# Patient Record
Sex: Male | Born: 1950 | Race: Black or African American | Hispanic: No | Smoking: Never smoker
Health system: Southern US, Community
[De-identification: ages and names within clinical notes are randomized; demographics above are authoritative.]

## PROBLEM LIST (undated history)

## (undated) DIAGNOSIS — F329 Major depressive disorder, single episode, unspecified: Secondary | ICD-10-CM

## (undated) DIAGNOSIS — I255 Ischemic cardiomyopathy: Secondary | ICD-10-CM

## (undated) DIAGNOSIS — I10 Essential (primary) hypertension: Secondary | ICD-10-CM

## (undated) DIAGNOSIS — E559 Vitamin D deficiency, unspecified: Secondary | ICD-10-CM

## (undated) DIAGNOSIS — R9431 Abnormal electrocardiogram [ECG] [EKG]: Secondary | ICD-10-CM

## (undated) DIAGNOSIS — J90 Pleural effusion, not elsewhere classified: Secondary | ICD-10-CM

## (undated) DIAGNOSIS — M109 Gout, unspecified: Secondary | ICD-10-CM

## (undated) DIAGNOSIS — E119 Type 2 diabetes mellitus without complications: Secondary | ICD-10-CM

## (undated) DIAGNOSIS — I5042 Chronic combined systolic (congestive) and diastolic (congestive) heart failure: Secondary | ICD-10-CM

## (undated) DIAGNOSIS — I2510 Atherosclerotic heart disease of native coronary artery without angina pectoris: Secondary | ICD-10-CM

## (undated) DIAGNOSIS — K219 Gastro-esophageal reflux disease without esophagitis: Secondary | ICD-10-CM

## (undated) DIAGNOSIS — N183 Chronic kidney disease, stage 3 unspecified: Secondary | ICD-10-CM

## (undated) DIAGNOSIS — I6529 Occlusion and stenosis of unspecified carotid artery: Secondary | ICD-10-CM

## (undated) DIAGNOSIS — F32A Depression, unspecified: Secondary | ICD-10-CM

## (undated) DIAGNOSIS — E785 Hyperlipidemia, unspecified: Secondary | ICD-10-CM

## (undated) DIAGNOSIS — I493 Ventricular premature depolarization: Secondary | ICD-10-CM

## (undated) HISTORY — DX: Atherosclerotic heart disease of native coronary artery without angina pectoris: I25.10

## (undated) HISTORY — DX: Ventricular premature depolarization: I49.3

## (undated) HISTORY — DX: Gastro-esophageal reflux disease without esophagitis: K21.9

## (undated) HISTORY — DX: Type 2 diabetes mellitus without complications: E11.9

## (undated) HISTORY — DX: Gout, unspecified: M10.9

## (undated) HISTORY — DX: Vitamin D deficiency, unspecified: E55.9

## (undated) HISTORY — DX: Chronic combined systolic (congestive) and diastolic (congestive) heart failure: I50.42

## (undated) HISTORY — PX: CARDIAC CATHETERIZATION: SHX172

## (undated) HISTORY — DX: Occlusion and stenosis of unspecified carotid artery: I65.29

## (undated) HISTORY — DX: Hyperlipidemia, unspecified: E78.5

## (undated) HISTORY — DX: Essential (primary) hypertension: I10

## (undated) HISTORY — DX: Pleural effusion, not elsewhere classified: J90

---

## 2005-09-03 ENCOUNTER — Other Ambulatory Visit: Payer: Self-pay

## 2005-09-03 ENCOUNTER — Emergency Department: Payer: Self-pay | Admitting: Emergency Medicine

## 2006-04-25 HISTORY — PX: CORONARY ANGIOPLASTY WITH STENT PLACEMENT: SHX49

## 2012-06-05 HISTORY — PX: CORONARY ANGIOPLASTY WITH STENT PLACEMENT: SHX49

## 2014-09-04 ENCOUNTER — Encounter (INDEPENDENT_AMBULATORY_CARE_PROVIDER_SITE_OTHER): Payer: Self-pay

## 2014-09-04 ENCOUNTER — Ambulatory Visit (INDEPENDENT_AMBULATORY_CARE_PROVIDER_SITE_OTHER): Payer: Non-veteran care | Admitting: Cardiovascular Disease

## 2014-09-04 ENCOUNTER — Encounter: Payer: Self-pay | Admitting: Cardiovascular Disease

## 2014-09-04 VITALS — BP 100/70 | HR 63 | Ht 66.0 in | Wt 168.0 lb

## 2014-09-04 DIAGNOSIS — I255 Ischemic cardiomyopathy: Secondary | ICD-10-CM | POA: Insufficient documentation

## 2014-09-04 DIAGNOSIS — Z955 Presence of coronary angioplasty implant and graft: Secondary | ICD-10-CM | POA: Diagnosis not present

## 2014-09-04 DIAGNOSIS — R0602 Shortness of breath: Secondary | ICD-10-CM

## 2014-09-04 DIAGNOSIS — J40 Bronchitis, not specified as acute or chronic: Secondary | ICD-10-CM | POA: Insufficient documentation

## 2014-09-04 DIAGNOSIS — I2510 Atherosclerotic heart disease of native coronary artery without angina pectoris: Secondary | ICD-10-CM

## 2014-09-04 DIAGNOSIS — I5022 Chronic systolic (congestive) heart failure: Secondary | ICD-10-CM

## 2014-09-04 NOTE — Progress Notes (Signed)
Patient ID: Kyle MilroyWillie Pomerleau, male    DOB: 09/27/1950, 64 y.o.   MRN: [id number]  HPI Comments: Mr. Kyle Morton is a very pleasant 64 year old gentleman with a history of coronary artery disease, xience stent placed to his diagonal #1 in 2007, 2.5 x 15 mm stent placed to his OM 3 in 2013, with 3 stents placed in 2015 though details of his coronary anatomy are unavailable at this time, who presents for evaluation of shortness of breath. Baseline ejection fraction 30-35%.  He reports that he's had a difficult month or 2. He reports having fatigue, shortness of breath, cough with sputum. Symptoms got worse in December and January 2016. He was started on antibiotics with some improvement of his symptoms. Also given prednisone, albuterol inhaler, medications for his sinuses, Mucinex. He continues to have tightness in his chest, shortness of breath, occasional cough. Symptoms of shortness of breath are worse when supine, better when upright. [facility] and given a high dose Lasix here previously was taking Lasix 20 with a grams twice a day. He was told to take Lasix 40 mg in the morning, 20 mg in the p.m. for a short period of time. No significant improvement in his symptoms. Through this period, he was drinking more fluids. Weight has been stable in the low 160 range. He's also cautious about his renal function with overdiuresis. He was told on one of his emergency room visits that his labs were abnormal consistent with CHF. Possibly elevated BNP. This was in the setting of upper respiratory infection.  Records have been requested from the [facility] EKG today shows normal sinus rhythm with rate 63 bpm, poor R-wave progression to the anterior leads, unable to exclude anterior MI, T-wave abnormality anterolateral leads    No Known Allergies  Outpatient Encounter Prescriptions as of 09/04/2014  Medication Sig  . allopurinol (ZYLOPRIM) 100 MG tablet Take 100 mg by mouth daily.  Kyle Morton. aspirin 81 MG  tablet Take 81 mg by mouth daily.  Kyle Morton. atorvastatin (LIPITOR) 80 MG tablet Take 80 mg by mouth daily.  . benzonatate (TESSALON) 100 MG capsule Take by mouth 4 (four) times daily as needed for cough.  . carvedilol (COREG) 12.5 MG tablet Take 12.5 mg by mouth 2 (two) times daily with a meal.  . clopidogrel (PLAVIX) 75 MG tablet Take 75 mg by mouth daily.  . fluticasone (VERAMYST) 27.5 MCG/SPRAY nasal spray Place 2 sprays into the nose daily.  . furosemide (LASIX) 20 MG tablet Take 20 mg by mouth 2 (two) times daily.  Kyle Morton. guaifenesin (HUMIBID E) 400 MG TABS tablet Take 400 mg by mouth every 4 (four) hours.  . isosorbide mononitrate (IMDUR) 60 MG 24 hr tablet Take 60 mg by mouth daily.  Kyle Morton. lisinopril (PRINIVIL,ZESTRIL) 40 MG tablet Take 40 mg by mouth daily.  Kyle Morton. omeprazole (PRILOSEC) 20 MG capsule Take 40 mg by mouth daily.  Kyle Morton. spironolactone (ALDACTONE) 25 MG tablet Take 12.5 mg by mouth daily.  . traZODone (DESYREL) 100 MG tablet Take 100 mg by mouth at bedtime.  Kyle Morton. venlafaxine XR (EFFEXOR-XR) 150 MG 24 hr capsule Take 150 mg by mouth daily with breakfast.  . Vitamin D, Ergocalciferol, (DRISDOL) 50000 UNITS CAPS capsule Take 50,000 Units by mouth every 7 (seven) days.    Past Medical History  Diagnosis Date  . Pleural effusion   . CHF (congestive heart failure)   . Coronary artery disease   . Hypertension   . Hyperlipidemia   .  Diabetes mellitus without complication   . PVC's (premature ventricular contractions)   . Vitamin D deficiency   . Gout     feet   . GERD (gastroesophageal reflux disease)     Past Surgical History  Procedure Laterality Date  . Cardiac catheterization  2013,2007  . Coronary angioplasty with stent placement  06/05/2012    OM3  . Coronary angioplasty with stent placement  04/25/2006    RAD    Social History  reports that he has never smoked. He does not have any smokeless tobacco history on file. He reports that he does not drink alcohol or use illicit  drugs.  Family History family history includes Hypertension in his brother, father, and mother.      Review of Systems  Constitutional: Positive for fatigue.  Respiratory: Positive for cough and shortness of breath.   Cardiovascular: Negative.   Gastrointestinal: Negative.   Musculoskeletal: Negative.   Skin: Positive for rash.  Neurological: Negative.   Hematological: Negative.   Psychiatric/Behavioral: Negative.   All other systems reviewed and are negative.   BP 100/70 mmHg  Pulse 63  Ht  (1.676 m)  Wt 168 lb (76.204 kg)  BMI 27.13 kg/m2  Physical Exam  Constitutional: He is oriented to person, place, and time. He appears well-developed and well-nourished.  HENT:  Head: Normocephalic.  Nose: Nose normal.  Mouth/Throat: Oropharynx is clear and moist.  Eyes: Conjunctivae are normal. Pupils are equal, round, and reactive to light.  Neck: Normal range of motion. Neck supple. No JVD present.  Cardiovascular: Normal rate, regular rhythm, S1 normal, S2 normal, normal heart sounds and intact distal pulses.  Exam reveals no gallop and no friction rub.   No murmur heard. Trace edema  Pulmonary/Chest: Effort normal and breath sounds normal. No respiratory distress. He has no wheezes. He has no rales. He exhibits no tenderness.  Abdominal: Soft. Bowel sounds are normal. He exhibits no distension. There is no tenderness.  Musculoskeletal: Normal range of motion. He exhibits edema. He exhibits no tenderness.  Lymphadenopathy:    He has no cervical adenopathy.  Neurological: He is alert and oriented to person, place, and time. Coordination normal.  Skin: Skin is warm and dry. No rash noted. No erythema.  Psychiatric: He has a normal mood and affect. His behavior is normal. Judgment and thought content normal.      Assessment and Plan   Nursing note and vitals reviewed.

## 2014-09-04 NOTE — Assessment & Plan Note (Signed)
We will try to obtain most recent echocardiogram. He reports no echocardiogram for at least 6 or 8 months, ejection fraction 30%. He has declined ICD at the [facility]

## 2014-09-04 NOTE — Assessment & Plan Note (Signed)
Currently with no symptoms of angina. No further workup at this time. Continue current medication regimen. 

## 2014-09-04 NOTE — Assessment & Plan Note (Signed)
After long discussion with him of his recent illness, I suspect he has resolving bronchitis with continued residual inflammatory lung disease. Suggested he continue on his albuterol inhaler, Mucinex. If symptoms get worse with increasing sputum, suggested he call our office.

## 2014-09-04 NOTE — Assessment & Plan Note (Signed)
Ejection fraction 30-35%. Recommended he increase the Lasix up to 40 mg in the morning and 20 mg in the evening with goal of 3 pound weight loss. Also recommended he decrease his fluid intake. He has been drinking more. Suspect he has acute on chronic diastolic CHF making his breathing worse. We can check his Bmp at his convenience in the next several weeks or when 3 pound weight decrease has occurred

## 2014-09-04 NOTE — Assessment & Plan Note (Signed)
His shortness of breath is likely multifactorial including residual effects from his bronchitis and are slowly resolving, also acute on chronic  systolic CHF. Treatment plan as above

## 2014-09-04 NOTE — Patient Instructions (Signed)
Please take lasix 40 mg in the am and 20 mg in the PM Monitor your weight daily Try to get your weight down 3 pounds  Come in for blood work when your weight is down 3 pounds  Please call us if you have new issues that need to be addressed before your next appt.  Your physician wants you to follow-up in: 2 -3 weeks

## 2014-09-04 NOTE — Assessment & Plan Note (Signed)
Records have been requested from the [facility]. Recommended that he continue on his aspirin and Plavix

## 2014-09-13 ENCOUNTER — Telehealth: Payer: Self-pay

## 2014-09-13 ENCOUNTER — Other Ambulatory Visit: Payer: Self-pay | Admitting: Cardiovascular Disease

## 2014-09-13 ENCOUNTER — Other Ambulatory Visit: Payer: Non-veteran care | Admitting: *Deleted

## 2014-09-13 NOTE — Telephone Encounter (Signed)
Would add extra isosorbide 30 mg in the evening for blood pressure Would increase Lasix up to 40 mg twice a day for recent weight gain Referral to CHF clinic for assistance and close monitoring

## 2014-09-13 NOTE — Telephone Encounter (Signed)
Left detailed message w/ Dr. Windell HummingbirdGollan's recommendation.  Advised him that I will call him Monday w/ appt to HF clinic.

## 2014-09-13 NOTE — Telephone Encounter (Signed)
Pt came to office for lab draw and gave the following BP & wt readings:   BP  Wt 1/16 148/117 1/28 140/90  159 1/30 145/105  1/31 151/110 163 2/1 150/114 164 2/2 153/95  164 2/3 156/112 162 2/4 131/106 161 2/5 147/101  2/6 136/114 159 2/7 152/112 159 2/8 148/114 160 2/9 144/111 156 2/10 146/114 159 2/11 132/98  164 2/12 156/125 163

## 2014-09-13 NOTE — Telephone Encounter (Signed)
Spoke w/ pt.  He reports he has been adhering to a low sodium diet and restricting his fluids to the point that his throat is dry.  Reports he has developed difficulty swallowing and is coughing up mucus.  Discussed w/ pt his diet and fluid intake. He reports that he is getting over bronchitis and is concerned about the secretions he is coughing up. He states that his lasix was increased to 40 mg qam and 20 mg in the evening, but he has not noticed a significant increase in his urination.  He states that he will call his PCP to discuss f/u for his bronchitis, but would like to know if he should increase his lasix over the weekend.

## 2014-09-13 NOTE — Telephone Encounter (Signed)
Pt has question regarding Lasix, states his weight oin 2/9 156 and next day 2/10 was 160. States when he swallows, it "hurts up in that area", and his stomach hurts with his SOB. Please call.

## 2014-09-14 LAB — BASIC METABOLIC PANEL
BUN/Creatinine Ratio: 17 (ref 10–22)
BUN: 21 mg/dL (ref 8–27)
CALCIUM: 8.6 mg/dL (ref 8.6–10.2)
CO2: 22 mmol/L (ref 18–29)
CREATININE: 1.25 mg/dL (ref 0.76–1.27)
Chloride: 101 mmol/L (ref 97–108)
GFR calc Af Amer: 69 mL/min/{1.73_m2} (ref >59)
GFR, EST NON AFRICAN AMERICAN: 60 mL/min/{1.73_m2} (ref >59)
Glucose: 255 mg/dL — ABNORMAL HIGH (ref 65–99)
Potassium: 4.4 mmol/L (ref 3.5–5.2)
Sodium: 140 mmol/L (ref 134–144)

## 2014-09-17 NOTE — Telephone Encounter (Signed)
Spoke w/ pt.  Advised him that we have not yet received his records from the [facility].  Advised him of appt w/ the HF clinic on 10/04/14 @ 10:00. He verbalizes understanding and will call back w/ any questions or concerns.

## 2014-09-19 ENCOUNTER — Encounter: Payer: Self-pay | Admitting: Cardiovascular Disease

## 2014-09-19 ENCOUNTER — Ambulatory Visit (INDEPENDENT_AMBULATORY_CARE_PROVIDER_SITE_OTHER): Payer: Non-veteran care | Admitting: Cardiovascular Disease

## 2014-09-19 ENCOUNTER — Telehealth: Payer: Self-pay

## 2014-09-19 VITALS — BP 108/80 | HR 69 | Ht 66.0 in | Wt 170.8 lb

## 2014-09-19 DIAGNOSIS — I2510 Atherosclerotic heart disease of native coronary artery without angina pectoris: Secondary | ICD-10-CM | POA: Diagnosis not present

## 2014-09-19 DIAGNOSIS — Z955 Presence of coronary angioplasty implant and graft: Secondary | ICD-10-CM

## 2014-09-19 DIAGNOSIS — I5022 Chronic systolic (congestive) heart failure: Secondary | ICD-10-CM

## 2014-09-19 DIAGNOSIS — I255 Ischemic cardiomyopathy: Secondary | ICD-10-CM | POA: Diagnosis not present

## 2014-09-19 DIAGNOSIS — R0602 Shortness of breath: Secondary | ICD-10-CM

## 2014-09-19 DIAGNOSIS — J40 Bronchitis, not specified as acute or chronic: Secondary | ICD-10-CM

## 2014-09-19 DIAGNOSIS — R079 Chest pain, unspecified: Secondary | ICD-10-CM

## 2014-09-19 MED ORDER — TORSEMIDE 20 MG PO TABS: 20.0000 mg | ORAL_TABLET | Freq: Two times a day (BID) | ORAL | Status: DC

## 2014-09-19 NOTE — Assessment & Plan Note (Signed)
Ischemic cardiopathy. Recent stent placement. Recommended he stay on his aspirin and Plavix

## 2014-09-19 NOTE — Assessment & Plan Note (Signed)
Records have been requested. We'll continue current medications. Currently with no symptoms of angina

## 2014-09-19 NOTE — Assessment & Plan Note (Signed)
Weight is up 2 pounds on today's visit even on Lasix 40 mg twice a day. At home and appears his weight is 156 up to 164 pounds. Ideal weight in the mid 150 range We have recommended he follow the instructions below Please hold the lasix For weight less than 156 pounds, take torsemide 10 mg a day For weight greater than 156 take torsemide 20 mg once a day For weight greater than 160 take torsemide 20 mg twice a day For weight greater than 165 take torsemide 40 mg twice a day

## 2014-09-19 NOTE — Assessment & Plan Note (Signed)
Currently with no symptoms of angina. No further workup at this time. Continue current medication regimen. 

## 2014-09-19 NOTE — Assessment & Plan Note (Signed)
Symptoms significantly improved, still with some chest congestion. No further antibiotics at this time

## 2014-09-19 NOTE — Telephone Encounter (Signed)
Pt's PCP Dr. Toya SmothersLance Tegen @ [facility]. Fax # [phone].

## 2014-09-19 NOTE — Progress Notes (Signed)
Patient ID: Kyle MilroyWillie Morton, male    DOB: 05/13/1951, 64 y.o.   MRN: [id number]  HPI Comments: Kyle Morton is a very pleasant 64 year old gentleman with a history of coronary artery disease, xience stent placed to his diagonal #1 in 2007, 2.5 x 15 mm stent placed to his OM 3 in 2013, with 3 stents placed in 2015 though details of his coronary anatomy are unavailable at this time, recently seen for shortness of breath. Baseline ejection fraction 30-35%. Records have been requested from the [facility]  In follow-up today, he reports that shortness of breath is mildly improved on Lasix 40 mg twice a day. Abdominal bloating is better, still has some congestion up in his chest. Previous upper respiratory infection improving, took Mucinex with improvement. He feels that his weight is down several pounds per our scale show his weight is up 2 pounds from his prior clinic visit He reports that home his weight has been ranging from 156 pounds up to 164 pounds  EKG today shows normal sinus rhythm with rate 69 bpm, poor R-wave progression to the anterior leads, unable to exclude anterior MI, T-wave abnormality anterolateral leads   Other past medical history On his initial visit, he was having fatigue, shortness of breath, cough with sputum. Symptoms got worse in December and January 2016. He was started on antibiotics with some improvement of his symptoms. Also given prednisone, albuterol inhaler, medications for his sinuses, Mucinex. He continues to have tightness in his chest, shortness of breath, occasional cough.   [facility] and given a high dose Lasix here previously was taking Lasix 20 with a grams twice a day. He was told to take Lasix 40 mg in the morning, 20 mg in the p.m. for a short period of time. No significant improvement in his symptoms. Through this period, he was drinking more fluids. Weight has been stable in the low 160 range.  He was told on one of his emergency room visits  that his labs were abnormal consistent with CHF. Possibly elevated BNP. This was in the setting of upper respiratory infection.     No Known Allergies  Outpatient Encounter Prescriptions as of 09/19/2014  Medication Sig  . allopurinol (ZYLOPRIM) 100 MG tablet Take 100 mg by mouth daily.  Marland Kitchen. aspirin 81 MG tablet Take 81 mg by mouth daily.  Marland Kitchen. atorvastatin (LIPITOR) 80 MG tablet Take 80 mg by mouth daily.  . carvedilol (COREG) 12.5 MG tablet Take 12.5 mg by mouth 2 (two) times daily with a meal.  . clopidogrel (PLAVIX) 75 MG tablet Take 75 mg by mouth daily.  . isosorbide mononitrate (IMDUR) 60 MG 24 hr tablet Take 60 mg by mouth daily.  Marland Kitchen. lisinopril (PRINIVIL,ZESTRIL) 40 MG tablet Take 40 mg by mouth daily.  Marland Kitchen. omeprazole (PRILOSEC) 20 MG capsule Take 40 mg by mouth daily.  Marland Kitchen. spironolactone (ALDACTONE) 25 MG tablet Take 12.5 mg by mouth daily.  . traZODone (DESYREL) 100 MG tablet Take 100 mg by mouth at bedtime.  Marland Kitchen. venlafaxine XR (EFFEXOR-XR) 150 MG 24 hr capsule Take 150 mg by mouth daily with breakfast.  . Vitamin D, Ergocalciferol, (DRISDOL) 50000 UNITS CAPS capsule Take 50,000 Units by mouth every 7 (seven) days.  . [DISCONTINUED] furosemide (LASIX) 20 MG tablet Take 20 mg by mouth 2 (two) times daily.  Marland Kitchen. torsemide (DEMADEX) 20 MG tablet Take 1 tablet (20 mg total) by mouth 2 (two) times daily.  . [DISCONTINUED] benzonatate (TESSALON) 100  MG capsule Take by mouth 4 (four) times daily as needed for cough.  . [DISCONTINUED] fluticasone (VERAMYST) 27.5 MCG/SPRAY nasal spray Place 2 sprays into the nose daily.  . [DISCONTINUED] guaifenesin (HUMIBID E) 400 MG TABS tablet Take 400 mg by mouth every 4 (four) hours.    Past Medical History  Diagnosis Date  . Pleural effusion   . CHF (congestive heart failure)   . Coronary artery disease   . Hypertension   . Hyperlipidemia   . Diabetes mellitus without complication   . PVC's (premature ventricular contractions)   . Vitamin D deficiency    . Gout     feet   . GERD (gastroesophageal reflux disease)     Past Surgical History  Procedure Laterality Date  . Cardiac catheterization  2013,2007  . Coronary angioplasty with stent placement  06/05/2012    OM3  . Coronary angioplasty with stent placement  04/25/2006    RAD    Social History  reports that he has never smoked. He does not have any smokeless tobacco history on file. He reports that he does not drink alcohol or use illicit drugs.  Family History family history includes Hypertension in his brother, father, and mother.   Review of Systems  Constitutional: Negative.   HENT: Negative.   Respiratory: Positive for shortness of breath.   Cardiovascular: Negative.   Gastrointestinal: Negative.   Musculoskeletal: Negative.   Skin: Positive for rash.  Neurological: Negative.   Hematological: Negative.   Psychiatric/Behavioral: Negative.   All other systems reviewed and are negative.   BP 108/80 mmHg  Pulse 69  Ht  (1.676 m)  Wt 170 lb 12 oz (77.452 kg)  BMI 27.57 kg/m2  Physical Exam  Constitutional: He is oriented to person, place, and time. He appears well-developed and well-nourished.  HENT:  Head: Normocephalic.  Nose: Nose normal.  Mouth/Throat: Oropharynx is clear and moist.  Eyes: Conjunctivae are normal. Pupils are equal, round, and reactive to light.  Neck: Normal range of motion. Neck supple. No JVD present.  Cardiovascular: Normal rate, regular rhythm, S1 normal, S2 normal, normal heart sounds and intact distal pulses.  Exam reveals no gallop and no friction rub.   No murmur heard. Trace edema  Pulmonary/Chest: Effort normal and breath sounds normal. No respiratory distress. He has no wheezes. He has no rales. He exhibits no tenderness.  Abdominal: Soft. Bowel sounds are normal. He exhibits no distension. There is no tenderness.  Musculoskeletal: Normal range of motion. He exhibits no edema or tenderness.  Lymphadenopathy:    He has no  cervical adenopathy.  Neurological: He is alert and oriented to person, place, and time. Coordination normal.  Skin: Skin is warm and dry. No rash noted. No erythema.  Psychiatric: He has a normal mood and affect. His behavior is normal. Judgment and thought content normal.      Assessment and Plan   Nursing note and vitals reviewed.

## 2014-09-19 NOTE — Assessment & Plan Note (Signed)
I suspect his chest congestion, shortness of breath possibly from mild systolic CHF, unable to exclude resolving bronchitis. We will change his diuretics as above to torsemide to maintain weight in the 155 range

## 2014-09-19 NOTE — Patient Instructions (Addendum)
You are doing well. Please hold the lasix  For weight less than 156 pounds, take torsemide 10 mg a day For weight greater than 156 take torsemide 20 mg once a day For weight greater than 160 take torsemide 20 mg twice a day For weight greater than 165 take torsemide 40 mg twice a day  Please call us if you have new issues that need to be addressed before your next appt.  Your physician wants you to follow-up in: 6 months.  You will receive a reminder letter in the mail two months in advance. If you don't receive a letter, please call our office to schedule the follow-up appointment.

## 2014-09-23 ENCOUNTER — Telehealth: Payer: Self-pay | Admitting: *Deleted

## 2014-09-23 NOTE — Telephone Encounter (Signed)
Kyle Morton [facility] called and needs a form that was faxed on Friday to be filled out stat so they can fill his Rx.

## 2014-09-23 NOTE — Telephone Encounter (Signed)
Patient called back from [facility] to say he is there in [place] at the Pharmacy waiting on the return form so they are able to fill his medication before he comes home to [place].  Patient does not want to make another trip.  Per mandy there is no form here in the office.  Asked [facility] to [facility].    Please call patient [facility] Prescription.

## 2014-09-23 NOTE — Telephone Encounter (Signed)
Please see note below. 

## 2014-09-23 NOTE — Telephone Encounter (Signed)
Pt notified that we have not received fax from [facility].

## 2014-09-23 NOTE — Telephone Encounter (Signed)
Left message for pt to call back  °

## 2014-09-24 NOTE — Telephone Encounter (Signed)
Pt need to know which medications out of the ones he has, that he needs to take and which ones to stop.   Please advise.

## 2014-09-25 NOTE — Telephone Encounter (Signed)
Spoke w/ pt.   He is asking is he should hold his lasix.  Advised pt that Dr. Mariah MillingGollan had stopped this at his last ov and changed him to torsemide.  Reviewed instructions w/ him.  Pt reports that he knows he is dehydrated, as he feels that his mouth is dry, so he has been drinking more water.  Had lengthy discussion about his fluid intake and after some explanation, pt seems to verbalize understanding.  Asked pt to call back w/ any questions or concerns.

## 2014-10-01 ENCOUNTER — Encounter: Admit: 2014-10-01 | Disposition: A | Discharge status: Home / Self Care | Payer: Self-pay

## 2014-10-07 ENCOUNTER — Ambulatory Visit: Payer: Self-pay | Admitting: Family

## 2014-11-01 ENCOUNTER — Encounter: Admit: 2014-11-01 | Disposition: A | Discharge status: Home / Self Care | Payer: Self-pay

## 2014-11-07 ENCOUNTER — Ambulatory Visit
Admit: 2014-11-07 | Disposition: A | Discharge status: Home / Self Care | Payer: Self-pay | Attending: Family | Admitting: Family

## 2014-11-27 ENCOUNTER — Telehealth: Payer: Self-pay | Admitting: *Deleted

## 2014-11-27 ENCOUNTER — Ambulatory Visit
Admit: 2014-11-27 | Disposition: A | Discharge status: Home / Self Care | Payer: Self-pay | Attending: Family | Admitting: Family

## 2014-11-27 DIAGNOSIS — R0602 Shortness of breath: Secondary | ICD-10-CM

## 2014-11-27 NOTE — Telephone Encounter (Signed)
Pt c/o Shortness Of Breath: STAT if SOB developed within the last 24 hours or pt is noticeably SOB on the phone  1. Are you currently SOB (can you hear that pt is SOB on the phone)? Yes he saw Clarisa Kindredina Hackney in heartfailure clinic and he is sob  2. How long have you been experiencing SOB? 1 month   3. Are you SOB when sitting or when up moving around? Both but worse at night  4. Are you currently experiencing any other symptoms? Pain in his neck

## 2014-11-27 NOTE — Telephone Encounter (Signed)
Left message for pt to call back  °

## 2014-11-27 NOTE — Telephone Encounter (Signed)
See below

## 2014-11-27 NOTE — Telephone Encounter (Signed)
Pt was transferred to Clarisa Kindredina Hackney, FNP @ HF clinic.

## 2014-11-27 NOTE — Telephone Encounter (Signed)
Pt calling problems breathing and  Pt c/o Shortness Of Breath: STAT if SOB developed within the last 24 hours or pt is noticeably SOB on the phone  1. Are you currently SOB (can you hear that pt is SOB on the phone)?  No   2. How long have you been experiencing SOB? Since last night  3. Are you SOB when sitting or when up moving around? Last night laying down, he used three pillows to help it stop  4. Are you currently experiencing any other symptoms? Says it is going back to where it was before he came in.

## 2014-11-28 NOTE — Telephone Encounter (Signed)
Spoke w/ pt.  Advised him that per HF note yesterday, Clarisa Kindredina Hackney, FNP recommends that pt have an ECHO. He is agreeable and would like to go ahead and set this up.  Pt transferred back to front desk.

## 2014-11-28 NOTE — Telephone Encounter (Signed)
Pt returning our call, stated that tina increased his water pill.  And told him to contact us for the SOB but he is not having it Also by increasing the water pill if this does not work then he may need a cath.  Please advise.

## 2014-12-02 ENCOUNTER — Ambulatory Visit: Payer: Non-veteran care

## 2014-12-03 ENCOUNTER — Telehealth: Payer: Self-pay | Admitting: *Deleted

## 2014-12-03 ENCOUNTER — Other Ambulatory Visit: Payer: Self-pay | Admitting: Cardiovascular Disease

## 2014-12-03 DIAGNOSIS — R42 Dizziness and giddiness: Secondary | ICD-10-CM

## 2014-12-03 DIAGNOSIS — R0600 Dyspnea, unspecified: Secondary | ICD-10-CM

## 2014-12-03 NOTE — Telephone Encounter (Signed)
Patient would also like a carotid u/s due to his neck pain same day as his echo.  Please call patient.

## 2014-12-03 NOTE — Telephone Encounter (Signed)
Spoke w/ pt.  He reports that he has been experiencing some lightheadedness, dizziness, and that his carotids "seem to be getting bigger". Pt is sched for ECHO on 5/23 and would like to know if can have a carotid u/s done at the same time.

## 2014-12-04 ENCOUNTER — Ambulatory Visit: Payer: Non-veteran care

## 2014-12-05 NOTE — Telephone Encounter (Signed)
Ok to add carotid if there is space on schedule thx

## 2014-12-06 ENCOUNTER — Ambulatory Visit: Payer: Non-veteran care

## 2014-12-06 NOTE — Telephone Encounter (Signed)
Can we set him up for carotid the same day as his ECHO?   Thank you!

## 2014-12-09 ENCOUNTER — Ambulatory Visit: Payer: Non-veteran care

## 2014-12-11 ENCOUNTER — Ambulatory Visit: Payer: Non-veteran care

## 2014-12-13 ENCOUNTER — Ambulatory Visit: Payer: Non-veteran care

## 2014-12-16 ENCOUNTER — Ambulatory Visit: Payer: Non-veteran care

## 2014-12-18 ENCOUNTER — Telehealth: Payer: Self-pay | Admitting: *Deleted

## 2014-12-18 ENCOUNTER — Ambulatory Visit: Payer: Non-veteran care

## 2014-12-18 NOTE — Telephone Encounter (Signed)
Kyle Morton has more tests/procedures end of May.  Not ready to return .

## 2014-12-20 ENCOUNTER — Ambulatory Visit: Payer: Non-veteran care

## 2014-12-22 ENCOUNTER — Encounter: Payer: Self-pay | Admitting: *Deleted

## 2014-12-22 NOTE — Progress Notes (Signed)
Documentation from previous EMR to update the Individualized Treatment Plan in [facility]. 

## 2014-12-23 ENCOUNTER — Other Ambulatory Visit: Payer: Non-veteran care

## 2014-12-23 ENCOUNTER — Ambulatory Visit: Payer: Non-veteran care

## 2014-12-24 ENCOUNTER — Encounter: Payer: Self-pay | Admitting: *Deleted

## 2014-12-24 ENCOUNTER — Other Ambulatory Visit: Payer: Self-pay

## 2014-12-24 ENCOUNTER — Telehealth: Payer: Self-pay | Admitting: Cardiovascular Disease

## 2014-12-24 ENCOUNTER — Ambulatory Visit (INDEPENDENT_AMBULATORY_CARE_PROVIDER_SITE_OTHER): Payer: Non-veteran care

## 2014-12-24 DIAGNOSIS — R42 Dizziness and giddiness: Secondary | ICD-10-CM | POA: Diagnosis not present

## 2014-12-24 DIAGNOSIS — R0600 Dyspnea, unspecified: Secondary | ICD-10-CM

## 2014-12-24 NOTE — Telephone Encounter (Signed)
Attempted to contact pt.   No answer, vm box has not been set up yet.  

## 2014-12-24 NOTE — Telephone Encounter (Signed)
Patient has question about torsemide and increase of aldactone.  Patient has started losing weight and wants to know what to do about med routine now.  Please call patient.

## 2014-12-24 NOTE — Progress Notes (Signed)
Cardiac Individual Treatment Plan  Patient Details  Name: Corley Maffeo MRN: [id number] Date of Birth: [birth year] Referring Provider:  No ref. provider found  Initial Encounter Date:    Patient's Home Medications on Admission:  Current outpatient prescriptions:  .  allopurinol (ZYLOPRIM) 100 MG tablet, Take 100 mg by mouth daily., Disp: , Rfl:  .  aspirin 81 MG tablet, Take 81 mg by mouth daily., Disp: , Rfl:  .  atorvastatin (LIPITOR) 80 MG tablet, Take 80 mg by mouth daily., Disp: , Rfl:  .  carvedilol (COREG) 12.5 MG tablet, Take 12.5 mg by mouth 2 (two) times daily with a meal., Disp: , Rfl:  .  cetirizine (ZYRTEC) 10 MG tablet, Take 10 mg by mouth daily., Disp: , Rfl:  .  Cholecalciferol 1000 UNITS tablet, Take 4,000 Units by mouth daily., Disp: , Rfl:  .  clopidogrel (PLAVIX) 75 MG tablet, Take 75 mg by mouth daily., Disp: , Rfl:  .  colchicine 0.6 MG tablet, Take 0.6 mg by mouth daily., Disp: , Rfl:  .  dextrose (GLUTOSE) 40 % GEL, Take 1 Tube by mouth once as needed for low blood sugar., Disp: , Rfl:  .  docusate sodium (COLACE) 100 MG capsule, Take 100 mg by mouth 2 (two) times daily., Disp: , Rfl:  .  ezetimibe (ZETIA) 10 MG tablet, Take 5 mg by mouth daily., Disp: , Rfl:  .  fluticasone (FLONASE) 50 MCG/ACT nasal spray, Place 2 sprays into both nostrils daily., Disp: , Rfl:  .  hydrochlorothiazide (HYDRODIURIL) 50 MG tablet, Take 50 mg by mouth daily., Disp: , Rfl:  .  insulin glargine (LANTUS) 100 UNIT/ML injection, Inject 25 Units into the skin at bedtime., Disp: , Rfl:  .  isosorbide mononitrate (IMDUR) 60 MG 24 hr tablet, Take 30 mg by mouth daily. , Disp: , Rfl:  .  lisinopril (PRINIVIL,ZESTRIL) 40 MG tablet, Take 40 mg by mouth daily., Disp: , Rfl:  .  niacin 100 MG tablet, Take 400 mg by mouth at bedtime., Disp: , Rfl:  .  omeprazole (PRILOSEC) 20 MG capsule, Take 40 mg by mouth daily., Disp: , Rfl:  .  spironolactone (ALDACTONE) 25 MG tablet, Take 12.5 mg by mouth  daily., Disp: , Rfl:  .  torsemide (DEMADEX) 20 MG tablet, Take 1 tablet (20 mg total) by mouth 2 (two) times daily., Disp: 180 tablet, Rfl: 3 .  traZODone (DESYREL) 100 MG tablet, Take 100 mg by mouth at bedtime., Disp: , Rfl:  .  venlafaxine XR (EFFEXOR-XR) 150 MG 24 hr capsule, Take 150 mg by mouth daily with breakfast., Disp: , Rfl:  .  Vitamin D, Ergocalciferol, (DRISDOL) 50000 UNITS CAPS capsule, Take 50,000 Units by mouth every 7 (seven) days., Disp: , Rfl:   Past Medical History: Past Medical History  Diagnosis Date  . Pleural effusion   . CHF (congestive heart failure)   . Coronary artery disease   . Hypertension   . Hyperlipidemia   . Diabetes mellitus without complication   . PVC's (premature ventricular contractions)   . Vitamin D deficiency   . Gout     feet   . GERD (gastroesophageal reflux disease)     Tobacco Use: History  Smoking status  . Never Smoker   Smokeless tobacco  . Never Used    Labs: Recent Review Flowsheet Data    There is no flowsheet data to display.       Exercise Target Goals:    Exercise Program Goal:  Individual exercise prescription set with THRR, safety & activity barriers. Participant demonstrates ability to understand and report RPE using BORG scale, to self-measure pulse accurately, and to acknowledge the importance of the exercise prescription.  Exercise Prescription Goal: Starting with aerobic activity 30 plus minutes a day, 3 days per week for initial exercise prescription. Provide home exercise prescription and guidelines that participant acknowledges understanding prior to discharge.  Activity Barriers & Risk Stratification:     Activity Barriers & Risk Stratification - 12/22/14 1301    Activity Barriers & Risk Stratification   Activity Barriers History of Falls;Assistive Device;Other (comment)   Comments Uses cane   Risk Stratification High      6 Minute Walk:   Initial Exercise Prescription:   Exercise  Prescription Changes:     Exercise Prescription Changes      12/24/14 0700           Exercise Review   Progression No  Absent since last review          Discharge Exercise Prescription:   Nutrition:  Target Goals: Understanding of nutrition guidelines, daily intake of sodium 1500mg , cholesterol 200mg , calories 30% from fat and 7% or less from saturated fats, daily to have 5 or more servings of fruits and vegetables.  Biometrics:    Nutrition Therapy Plan and Nutrition Goals:   Nutrition Discharge: Rate Your Plate Scores:   Nutrition Goals Re-Evaluation:   Psychosocial: Target Goals: Acknowledge presence or absence of depression, maximize coping skills, provide positive support system. Participant is able to verbalize types and ability to use techniques and skills needed for reducing stress and depression.  Initial Review & Psychosocial Screening:   Quality of Life Scores:   PHQ-9:     Recent Review Flowsheet Data    There is no flowsheet data to display.      Psychosocial Evaluation and Intervention:   Psychosocial Re-Evaluation:   Vocational Rehabilitation: Provide vocational rehab assistance to qualifying candidates.   Vocational Rehab Evaluation & Intervention:     Vocational Rehab - 12/22/14 1303    Initial Vocational Rehab Evaluation & Intervention   Assessment shows need for Vocational Rehabilitation No      Education: Education Goals: Education classes will be provided on a weekly basis, covering required topics. Participant will state understanding/return demonstration of topics presented.  Learning Barriers/Preferences:     Learning Barriers/Preferences - 12/22/14 1302    Learning Barriers/Preferences   Learning Barriers Exercise Concerns   Learning Preferences Group Instruction;Video;Written Material      Education Topics: General Nutrition Guidelines/Fats and Fiber: -Group instruction provided by verbal, written  material, models and posters to present the general guidelines for heart healthy nutrition. Gives an explanation and review of dietary fats and fiber.   Controlling Sodium/Reading Food Labels: -Group verbal and written material supporting the discussion of sodium use in heart healthy nutrition. Review and explanation with models, verbal and written materials for utilization of the food label.   Exercise Physiology & Risk Factors: - Group verbal and written instruction with models to review the exercise physiology of the cardiovascular system and associated critical values. Details cardiovascular disease risk factors and the goals associated with each risk factor.   Aerobic Exercise & Resistance Training: - Gives group verbal and written discussion on the health impact of inactivity. On the components of aerobic and resistive training programs and the benefits of this training and how to safely progress through these programs.   Flexibility, Balance, General Exercise Guidelines: - Provides group  verbal and written instruction on the benefits of flexibility and balance training programs. Provides general exercise guidelines with specific guidelines to those with heart or lung disease. Demonstration and skill practice provided.   Stress Management: - Provides group verbal and written instruction about the health risks of elevated stress, cause of high stress, and healthy ways to reduce stress.   Depression: - Provides group verbal and written instruction on the correlation between heart/lung disease and depressed mood, treatment options, and the stigmas associated with seeking treatment.   Anatomy & Physiology of the Heart: - Group verbal and written instruction and models provide basic cardiac anatomy and physiology, with the coronary electrical and arterial systems. Review of: AMI, Angina, Valve disease, Heart Failure, Cardiac Arrhythmia, Pacemakers, and the ICD.   Cardiac  Procedures: - Group verbal and written instruction and models to describe the testing methods done to diagnose heart disease. Reviews the outcomes of the test results. Describes the treatment choices: Medical Management, Angioplasty, or Coronary Bypass Surgery.   Cardiac Medications: - Group verbal and written instruction to review commonly prescribed medications for heart disease. Reviews the medication, class of the drug, and side effects. Includes the steps to properly store meds and maintain the prescription regimen.   Go Sex-Intimacy & Heart Disease, Get SMART - Goal Setting: - Group verbal and written instruction through game format to discuss heart disease and the return to sexual intimacy. Provides group verbal and written material to discuss and apply goal setting through the application of the S.M.A.R.T. Method.   Other Matters of the Heart: - Provides group verbal, written materials and models to describe Heart Failure, Angina, Valve Disease, and Diabetes in the realm of heart disease. Includes description of the disease process and treatment options available to the cardiac patient.   Exercise & Equipment Safety: - Individual verbal instruction and demonstration of equipment use and safety with use of the equipment.   Infection Prevention: - Provides verbal and written material to individual with discussion of infection control including proper hand washing and proper equipment cleaning during exercise session.   Falls Prevention: - Provides verbal and written material to individual with discussion of falls prevention and safety.   Diabetes: - Individual verbal and written instruction to review signs/symptoms of diabetes, desired ranges of glucose level fasting, after meals and with exercise. Advice that pre and post exercise glucose checks will be done for 3 sessions at entry of program.    Knowledge Questionnaire Score:   Personal Goals and Risk Factors at  Admission:   Personal Goals and Risk Factors Review:    Personal Goals Discharge:     Comments: 30 day review    HAs been out with medical concerns. Spoke with [place] on 12/18/2014. He returns to [facility] for more tests and is not ready to return to the program.  He does want to return when he is better.

## 2014-12-25 ENCOUNTER — Ambulatory Visit: Payer: Non-veteran care

## 2014-12-26 ENCOUNTER — Other Ambulatory Visit: Payer: Self-pay | Admitting: *Deleted

## 2014-12-26 DIAGNOSIS — I255 Ischemic cardiomyopathy: Secondary | ICD-10-CM

## 2014-12-26 DIAGNOSIS — Z9861 Coronary angioplasty status: Secondary | ICD-10-CM

## 2014-12-27 ENCOUNTER — Ambulatory Visit: Payer: Non-veteran care

## 2015-01-01 ENCOUNTER — Telehealth: Payer: Self-pay

## 2015-01-01 ENCOUNTER — Encounter: Payer: Self-pay | Admitting: Physician Assistant

## 2015-01-01 ENCOUNTER — Ambulatory Visit: Payer: Non-veteran care

## 2015-01-01 DIAGNOSIS — E119 Type 2 diabetes mellitus without complications: Secondary | ICD-10-CM | POA: Insufficient documentation

## 2015-01-01 DIAGNOSIS — E785 Hyperlipidemia, unspecified: Secondary | ICD-10-CM | POA: Insufficient documentation

## 2015-01-01 DIAGNOSIS — I10 Essential (primary) hypertension: Secondary | ICD-10-CM | POA: Insufficient documentation

## 2015-01-01 DIAGNOSIS — I2510 Atherosclerotic heart disease of native coronary artery without angina pectoris: Secondary | ICD-10-CM | POA: Insufficient documentation

## 2015-01-01 DIAGNOSIS — Z9861 Coronary angioplasty status: Secondary | ICD-10-CM

## 2015-01-01 DIAGNOSIS — I5043 Acute on chronic combined systolic (congestive) and diastolic (congestive) heart failure: Secondary | ICD-10-CM | POA: Insufficient documentation

## 2015-01-01 DIAGNOSIS — K219 Gastro-esophageal reflux disease without esophagitis: Secondary | ICD-10-CM | POA: Insufficient documentation

## 2015-01-01 NOTE — Telephone Encounter (Signed)
Spoke w/ pt.  Reviewed results of ECHO & carotid u/s w/ him.  Advised him of Dr. Windell HummingbirdGollan's recommendation.  He states that his SOB is worsening and that he would like an appt sooner than 6/23 offered.  Spoke w/ office manager and offered pt appt w/ Eula Listenyan Dunn, PA tomorrow.

## 2015-01-01 NOTE — Telephone Encounter (Signed)
Pt would like echo results 

## 2015-01-02 ENCOUNTER — Ambulatory Visit
Admission: RE | Admit: 2015-01-02 | Discharge: 2015-01-02 | Disposition: A | Discharge status: Home / Self Care | Payer: Non-veteran care | Source: Ambulatory Visit | Attending: Physician Assistant | Admitting: Physician Assistant

## 2015-01-02 ENCOUNTER — Encounter: Payer: Self-pay | Admitting: Physician Assistant

## 2015-01-02 ENCOUNTER — Ambulatory Visit (INDEPENDENT_AMBULATORY_CARE_PROVIDER_SITE_OTHER): Payer: Non-veteran care | Admitting: Physician Assistant

## 2015-01-02 VITALS — BP 110/84 | HR 62 | Ht 66.0 in | Wt 158.0 lb

## 2015-01-02 DIAGNOSIS — Z01818 Encounter for other preprocedural examination: Secondary | ICD-10-CM | POA: Diagnosis not present

## 2015-01-02 DIAGNOSIS — E785 Hyperlipidemia, unspecified: Secondary | ICD-10-CM

## 2015-01-02 DIAGNOSIS — I5042 Chronic combined systolic (congestive) and diastolic (congestive) heart failure: Secondary | ICD-10-CM

## 2015-01-02 DIAGNOSIS — I2510 Atherosclerotic heart disease of native coronary artery without angina pectoris: Secondary | ICD-10-CM | POA: Diagnosis not present

## 2015-01-02 DIAGNOSIS — I255 Ischemic cardiomyopathy: Secondary | ICD-10-CM

## 2015-01-02 DIAGNOSIS — R0602 Shortness of breath: Secondary | ICD-10-CM

## 2015-01-02 DIAGNOSIS — I10 Essential (primary) hypertension: Secondary | ICD-10-CM

## 2015-01-02 DIAGNOSIS — E1159 Type 2 diabetes mellitus with other circulatory complications: Secondary | ICD-10-CM

## 2015-01-02 NOTE — Patient Instructions (Addendum)
Medication Instructions:  Your physician recommends that you continue on your current medications as directed. Please refer to the Current Medication list given to you today.   Labwork: Your physician recommends that you have labs today: CBC, BMET, PT/INR  Testing/Procedures: Your physician has requested that you have  Chest xray. Please take the order given to you today. You may go to the [facility].  Your physician has requested that you have a cardiac catheterization. Cardiac catheterization is used to diagnose and/or treat various heart conditions. Doctors may recommend this procedure for a number of different reasons. The most common reason is to evaluate chest pain. Chest pain can be a symptom of coronary artery disease (CAD), and cardiac catheterization can show whether plaque is narrowing or blocking your heart's arteries. This procedure is also used to evaluate the valves, as well as measure the blood flow and oxygen levels in different parts of your heart.   Follow-Up: Your physician recommends that you schedule a follow-up appointment pending cardiac catheterization.    Any Other Special Instructions Will Be Listed Below (If Applicable).  Angiogram An angiogram, also called angiography, is a procedure used to look at the blood vessels that carry blood to different parts of your body (arteries). In this procedure, dye is injected through a long, thin tube (catheter) into an artery. X-rays are then taken. The X-rays will show if there is a blockage or problem in a blood vessel.  LET [facility] CARE PROVIDER KNOW ABOUT:  Any allergies you have, including allergies to shellfish or contrast dye.   All medicines you are taking, including vitamins, herbs, eye drops, creams, and over-the-counter medicines.   Previous problems you or members of your family have had with the use of anesthetics.   Any blood disorders you have.   Previous surgeries you have  had.  Any previous kidney problems or failure you have had.  Medical conditions you have.   Possibility of pregnancy, if this applies. RISKS AND COMPLICATIONS Generally, an angiogram is a safe procedure. However, as with any procedure, problems can occur. Possible problems include:  Injury to the blood vessels, including rupture or bleeding.  Infection or bruising at the catheter site.  Allergic reaction to the dye or contrast used.  Kidney damage from the dye or contrast used.  Blood clots that can lead to a stroke or heart attack. BEFORE THE PROCEDURE  Do not eat or drink after midnight on the night before the procedure, or as directed by your health care provider.   Ask your health care provider if you may drink enough water to take any needed medicines the morning of the procedure.   Do not take torsemide the morning of your procedure  Do not take insulin the night before your procedure PROCEDURE  You may be given a medicine to help you relax (sedative) before and during the procedure. This medicine is given through an IV access tube that is inserted into one of your veins.   The area where the catheter will be inserted will be washed and shaved. This is usually done in the groin but may be done in the fold of your arm (near your elbow) or in the wrist.  A medicine will be given to numb the area where the catheter will be inserted (local anesthetic).  The catheter will be inserted with a guide wire into an artery. The catheter is guided by using a type of X-ray (fluoroscopy) to the blood vessel being  examined.   Dye is then injected into the catheter, and X-rays are taken. The dye helps to show where any narrowing or blockages are located.  AFTER THE PROCEDURE   If the procedure is done through the leg, you will be kept in bed lying flat for several hours. You will be instructed to not bend or cross your legs.  The insertion site will be checked  frequently.  The pulse in your feet or wrist will be checked frequently.  Additional blood tests, X-rays, and electrocardiography may be done.   You may need to stay in the hospital overnight for observation.  Document Released: 04/28/2005 Document Revised: 07/24/2013 Document Reviewed: 12/20/2012 [facility] Patient Information 2015 [employer], [place]. This information is not intended to replace advice given to you by your health care provider. Make sure you discuss any questions you have with your health care provider.

## 2015-01-02 NOTE — Progress Notes (Signed)
Cardiology Office Note:  Date of Encounter: 01/02/2015  ID: Kyle Morton, DOB Nov 16, 1950, MRN [id number]  PCP:  Toya Smothers, PA Primary Cardiologist:  Dr. Mariah Milling, MD  Chief Complaint  Patient presents with  . other    F/u echo/carotid US c/o sob and chest pressure. Meds reviewed verbally with pt.    HPI:  64 year old male with history of CAD s/p multiple stenting, also with history of chronic combined systolic and diastolic CHF, HTN, HLD, and DM2 who comes to clinic today for follow up of his SOB and abnormal echo.     He has followed by the [facility], with clearance to be seen by [facility].   He has known CAD s/p Xience stent placed to his D1 in 2007, a 2.5 x 15 mm stent placed to his OM3 in 2013, and 3 stents placed in 2015, though details of his coronary anatomy remain unavailable at this time. Baseline EF reported to be 30-35% at the time of his outpatient office visit on 09/19/2014. While he was being followed at the [facility] his Lasix was increased to 40 mg q AM and 20 mg q PM for a short time period with no significant improvement in his symptoms, though he was drinking more fluids at the same time. His weight had been stable in the 160's. He was also told while he was being treated at the [facility] his BNP was elevated, though we do not have a value on file.   At his last outpatient follow up on 09/19/2014 he reported his SOB was somewhat improved on Lasix 40 mg bid. He also noted his abdominal bloating was somewhat better. He did still note some chest congestion. He was still coming down from a recent URI and was taking Mucinex. His weight had been fluctuating around 156 to 164. At that visit his weight was up 2 pounds from his prior visit. His ideal weight was felt to be in the mid 150's. He was changed to torsemide and placed on a sliding scale torsemide regimen as follows: for weight less than 156 pounds, take torsemide 10 mg a day. For weight  greater than 156 take torsemide 20 mg once a day. For weight greater than 160 take torsemide 20 mg twice a day. For weight greater than 165 take torsemide 40 mg twice a day. He was scheduled for an echo which showed EF 20-25%, diffuse HK,doppler parameters are consistent with restrictive physiology, indicative of decreased left ventricular diastolic compliance and/or increased left atrial pressure. Moderate MR, left atrium was severely dilated at 46 mm, moderate TR/PR, PASP severely increased at 70 mm Hg. The Inferior vena cava was dilated. The respirophasic diameter changes were blunted (< 50%), consistent with elevated central venous pressure. He reported worsening SOB and wanted to be seen soon.   He comes in stating he feels the same way he did back in 2015 when he had 3 stents placed at the [place]. He reports increased dyspnea, fatigue, and dizziness. To the point that he gets fatigued when brushing his teeth. Review of his weight and BP log shows over the past week he has gradually been coming down to his goal weight of the mid 150's and currently weighed at 153 this morning at his home scale. For the past 3 days he has only had to take torsemide 10 mg daily and has not had to take greater than 20 mg daily total since he was changed. He reports  significant improvement in his lower extremity edema, though his dyspnea remains. No orthopnea or cough. He has been limiting he po fluids and salt intake. He was previously in cardiac rehab but self discontinued this. He has never been a smoker or used illicit drugs. He previously drank heavy when he was in the [employer] but has not drank alcohol in years.     Past Medical History  Diagnosis Date  . Pleural effusion   . Chronic combined systolic and diastolic CHF (congestive heart failure)     a. reported baseline EF 30-35%; b. echo 12/2014: EF 20-25%, diffuse HK, restrictive filling pattern, severely dilated LA, mod MR/TR/PR, PASP 70 mm Hg, IVC dilted c/w elevated  CVP  . Coronary artery disease     a. xience stent to D1 in 2007; 2.5 x 15 mm stent placed to OM3 in 2013, w/ 3 stents placed in 2015 (details not available)  . Benign essential HTN   . Hyperlipidemia   . DM2 (diabetes mellitus, type 2)   . PVC's (premature ventricular contractions)   . Vitamin D deficiency   . Gout     feet   . GERD (gastroesophageal reflux disease)   . Carotid artery stenosis     a. carotid dopplers 12/2014: less than 39% ICA bilaterally, f/u 1 year  :  Past Surgical History  Procedure Laterality Date  . Cardiac catheterization  2013,2007  . Coronary angioplasty with stent placement  06/05/2012    OM3  . Coronary angioplasty with stent placement  04/25/2006    RAD  :  Social History:  The patient  reports that he has never smoked. He has never used smokeless tobacco. He reports that he does not drink alcohol or use illicit drugs.   Family History  Problem Relation Age of Onset  . Hypertension Mother   . Hypertension Father   . Hypertension Brother      Allergies:  Allergies  Allergen Reactions  . Lisinopril Cough     Home Medications:  Current Outpatient Prescriptions  Medication Sig Dispense Refill  . allopurinol (ZYLOPRIM) 100 MG tablet Take 100 mg by mouth daily.    Marland Kitchen aspirin 81 MG tablet Take 81 mg by mouth daily.    Marland Kitchen atorvastatin (LIPITOR) 80 MG tablet Take 80 mg by mouth daily.    . carvedilol (COREG) 12.5 MG tablet Take 12.5 mg by mouth 2 (two) times daily with a meal.    . cetirizine (ZYRTEC) 10 MG tablet Take 10 mg by mouth daily.    . chlorhexidine (PERIDEX) 0.12 % solution Use as directed 15 mLs in the mouth or throat 2 (two) times daily.    . clopidogrel (PLAVIX) 75 MG tablet Take 75 mg by mouth daily.    . colchicine 0.6 MG tablet Take 0.6 mg by mouth as needed.     Marland Kitchen dextrose (GLUTOSE) 40 % GEL Take 1 Tube by mouth once as needed for low blood sugar.    . fluticasone (FLONASE) 50 MCG/ACT nasal spray Place 2 sprays into both  nostrils daily.    . insulin glargine (LANTUS) 100 UNIT/ML injection Inject 25 Units into the skin at bedtime.    Marland Kitchen losartan (COZAAR) 50 MG tablet Take 50 mg by mouth daily.    . Menthol-Methyl Salicylate (THERA-GESIC EX) Apply topically 2 (two) times daily.    Marland Kitchen omeprazole (PRILOSEC) 20 MG capsule Take 40 mg by mouth daily.    . sodium fluoride (LURIDE) 1.1 (0.5 F) MG/ML SOLN Take  1 drop by mouth daily.    Marland Kitchen spironolactone (ALDACTONE) 25 MG tablet Take 12.5 mg by mouth daily.    Marland Kitchen torsemide (DEMADEX) 20 MG tablet Take 1 tablet (20 mg total) by mouth 2 (two) times daily. 180 tablet 3  . traZODone (DESYREL) 100 MG tablet Take 100 mg by mouth at bedtime.    Marland Kitchen venlafaxine XR (EFFEXOR-XR) 150 MG 24 hr capsule Take 150 mg by mouth daily with breakfast.    . Vitamin D, Cholecalciferol, 1000 UNITS CAPS Take 2,000 Units by mouth daily.    . Vitamin D, Ergocalciferol, (DRISDOL) 50000 UNITS CAPS capsule Take 50,000 Units by mouth every 7 (seven) days.     No current facility-administered medications for this visit.     Review of Systems:  Review of Systems  Constitutional: Positive for weight loss and malaise/fatigue. Negative for fever, chills and diaphoresis.       To goal of mid 150's through diuretic usage - still SOB  Eyes: Negative for photophobia, pain, discharge and redness.  Respiratory: Positive for cough and shortness of breath. Negative for hemoptysis, sputum production and wheezing.   Cardiovascular: Negative for chest pain, palpitations, orthopnea, claudication, leg swelling and PND.  Gastrointestinal: Positive for heartburn and nausea. Negative for vomiting, abdominal pain, diarrhea and constipation.  Genitourinary: Negative for hematuria.  Musculoskeletal: Negative for myalgias and falls.  Skin: Negative for rash.  Neurological: Positive for dizziness and weakness. Negative for sensory change, speech change, focal weakness and loss of consciousness.  Psychiatric/Behavioral:  Negative for depression, hallucinations and substance abuse. The patient is not nervous/anxious.   All other systems reviewed and are negative.    Physical Exam:  Blood pressure 110/84, pulse 62, height  (1.676 m), weight 158 lb (71.668 kg). Body mass index is 25.51 kg/(m^2). General: Pleasant, NAD. Psych: Normal affect. Neuro: Alert and oriented X 3. Moves all extremities spontaneously. HEENT: Normocephalic, atraumatic. EOM intact bilaterally.   Neck: Supple without bruits or JVD. Lungs:  Faint crackles at bilateral bases, otherwise without wheezing or rhonchi. Breathing is unlabored.  Heart: RRR no s3, s4. II/VI systolic murmur at apex.  No rubs or gallops.  Abdomen: Soft, non-tender, non-distended, BS + x 4.  Extremities: No clubbing, cyanosis. Trace non-pitting edema to the mid calves bilaterally. DP/PT/Radials 2+ and equal bilaterally.   Accessory Clinical Findings:  EKG - NSR, 62 bpm, left axis deviation, left anterior fascicular block, frequent PVCs, poor R wave progression, T wave abnormality along anterolateral leads   Other studies Reviewed: Additional studies/ records that were reviewed today include: prior office visit, echo, and carotid doppler.   Recent Labs: 09/13/2014: BUN 21; Creatinine 1.25; Potassium 4.4; Sodium 140    Lipid Panel No results found for: CHOL, TRIG, HDL, CHOLHDL, VLDL, LDLCALC, LDLDIRECT   Weights: Wt Readings from Last 3 Encounters:  01/02/15 158 lb (71.668 kg)  05/21/14 163 lb 4.8 oz (74.072 kg)  11/07/14 159 lb (72.122 kg)     Assessment & Plan:  1. Pulmonary HTN/Cor pulmonale:  -Recent echo 12/24/2014 showed EF 20-25%, diffuse HK, PASP 70 mmHg, restrictive filling pattern, after being transitioned from Lasix to torsemide, continued dyspnea, fatigue, and dizziness  -Schedule for right and left heart cath  -Possible referral to advanced heart failure team once heart cath is complete -Continue torsemide at this time per current  regimen  -Continue spironolactone, losartan, and carvedilol  -BP and pulse preclude titration of medications at this time   2. ICM/CAD s/p multiple stenting as above:  -  LHC and medications as above -Continue DAPT with aspirin and Plavix  -Continue Imdur 60 mg daily  4. Recent bronchitis:  -Resolved  5. HTN:  -Well controlled -Continue current medications as above   6. HLD:  -Continue Lipitor 80 mg   7. DM2:  -Per PCP   Dispo: -RHC/LHC -Follow up pending the above   Eula Listenyan Shirley Bolle, PA-C [facility] [address] [place], [place] [address] [phone] [facility] 01/02/2015, 4:46 PM

## 2015-01-03 ENCOUNTER — Telehealth: Payer: Self-pay | Admitting: Cardiology

## 2015-01-03 ENCOUNTER — Ambulatory Visit: Payer: Non-veteran care

## 2015-01-03 LAB — CBC
HEMOGLOBIN: 11.9 g/dL — AB (ref 12.6–17.7)
Hematocrit: 35.3 % — ABNORMAL LOW (ref 37.5–51.0)
MCH: 33.2 pg — ABNORMAL HIGH (ref 26.6–33.0)
MCHC: 33.7 g/dL (ref 31.5–35.7)
MCV: 99 fL — AB (ref 79–97)
PLATELETS: 184 10*3/uL (ref 150–379)
RBC: 3.58 x10E6/uL — ABNORMAL LOW (ref 4.14–5.80)
RDW: 14.1 % (ref 12.3–15.4)
WBC: 4.2 10*3/uL (ref 3.4–10.8)

## 2015-01-03 LAB — BASIC METABOLIC PANEL
BUN/Creatinine Ratio: 14 (ref 10–22)
BUN: 20 mg/dL (ref 8–27)
CHLORIDE: 103 mmol/L (ref 97–108)
CO2: 24 mmol/L (ref 18–29)
CREATININE: 1.47 mg/dL — AB (ref 0.76–1.27)
Calcium: 9.1 mg/dL (ref 8.6–10.2)
GFR calc non Af Amer: 49 mL/min/{1.73_m2} — ABNORMAL LOW (ref >59)
GFR, EST AFRICAN AMERICAN: 57 mL/min/{1.73_m2} — AB (ref >59)
GLUCOSE: 104 mg/dL — AB (ref 65–99)
Potassium: 3.9 mmol/L (ref 3.5–5.2)
Sodium: 140 mmol/L (ref 134–144)

## 2015-01-03 LAB — PROTIME-INR
INR: 1.2 (ref 0.8–1.2)
PROTHROMBIN TIME: 12.9 s — AB (ref 9.1–12.0)

## 2015-01-03 NOTE — Telephone Encounter (Signed)
Pt called wanting to know to what meds to hole before cath,  He will hold torsemide and spironolactone until after cath . And hold losartan  On 01/07/15.   Pt was afraid he was not cleared for cath but he talked to insurance and will call office and hospital on Monday to explain that he is cleared.  But he is cleared with authorization # [id number] authorization [id number].

## 2015-01-06 ENCOUNTER — Telehealth: Payer: Self-pay

## 2015-01-06 ENCOUNTER — Ambulatory Visit: Payer: Non-veteran care

## 2015-01-06 NOTE — Telephone Encounter (Signed)
Pt states he is having a chath tomorrow, and needs a request form for additional services form from [employer].[phone]. Auth # [id number]

## 2015-01-06 NOTE — Telephone Encounter (Signed)
Pt states he is having a chath tomorrow, and needs a request form for additional services form from [employer].[phone]. Auth # [id number] 

## 2015-01-07 ENCOUNTER — Encounter: Payer: Self-pay | Admitting: *Deleted

## 2015-01-07 ENCOUNTER — Encounter
Admission: RE | Disposition: A | Discharge status: Home / Self Care | Payer: Self-pay | Source: Ambulatory Visit | Attending: Cardiovascular Disease

## 2015-01-07 ENCOUNTER — Inpatient Hospital Stay
Admission: RE | Admit: 2015-01-07 | Discharge: 2015-01-10 | DRG: 287 | Disposition: A | Discharge status: Home / Self Care | Payer: No Typology Code available for payment source | Source: Ambulatory Visit | Attending: Cardiovascular Disease | Admitting: Cardiovascular Disease

## 2015-01-07 DIAGNOSIS — Z7982 Long term (current) use of aspirin: Secondary | ICD-10-CM

## 2015-01-07 DIAGNOSIS — E119 Type 2 diabetes mellitus without complications: Secondary | ICD-10-CM

## 2015-01-07 DIAGNOSIS — I5023 Acute on chronic systolic (congestive) heart failure: Secondary | ICD-10-CM | POA: Diagnosis present

## 2015-01-07 DIAGNOSIS — E876 Hypokalemia: Secondary | ICD-10-CM | POA: Diagnosis present

## 2015-01-07 DIAGNOSIS — K219 Gastro-esophageal reflux disease without esophagitis: Secondary | ICD-10-CM | POA: Diagnosis present

## 2015-01-07 DIAGNOSIS — Z955 Presence of coronary angioplasty implant and graft: Secondary | ICD-10-CM | POA: Diagnosis not present

## 2015-01-07 DIAGNOSIS — I2582 Chronic total occlusion of coronary artery: Secondary | ICD-10-CM | POA: Diagnosis present

## 2015-01-07 DIAGNOSIS — I10 Essential (primary) hypertension: Secondary | ICD-10-CM | POA: Diagnosis present

## 2015-01-07 DIAGNOSIS — I255 Ischemic cardiomyopathy: Secondary | ICD-10-CM | POA: Diagnosis present

## 2015-01-07 DIAGNOSIS — I2510 Atherosclerotic heart disease of native coronary artery without angina pectoris: Secondary | ICD-10-CM | POA: Diagnosis present

## 2015-01-07 DIAGNOSIS — I2781 Cor pulmonale (chronic): Secondary | ICD-10-CM | POA: Diagnosis present

## 2015-01-07 DIAGNOSIS — Z794 Long term (current) use of insulin: Secondary | ICD-10-CM

## 2015-01-07 DIAGNOSIS — I25118 Atherosclerotic heart disease of native coronary artery with other forms of angina pectoris: Secondary | ICD-10-CM | POA: Diagnosis not present

## 2015-01-07 DIAGNOSIS — E785 Hyperlipidemia, unspecified: Secondary | ICD-10-CM | POA: Diagnosis present

## 2015-01-07 DIAGNOSIS — I272 Other secondary pulmonary hypertension: Secondary | ICD-10-CM | POA: Diagnosis present

## 2015-01-07 DIAGNOSIS — Z9861 Coronary angioplasty status: Secondary | ICD-10-CM

## 2015-01-07 DIAGNOSIS — E1159 Type 2 diabetes mellitus with other circulatory complications: Secondary | ICD-10-CM | POA: Diagnosis not present

## 2015-01-07 HISTORY — DX: Depression, unspecified: F32.A

## 2015-01-07 HISTORY — PX: CARDIAC CATHETERIZATION: SHX172

## 2015-01-07 HISTORY — DX: Major depressive disorder, single episode, unspecified: F32.9

## 2015-01-07 LAB — GLUCOSE, CAPILLARY
GLUCOSE-CAPILLARY: 223 mg/dL — AB (ref 65–99)
Glucose-Capillary: 91 mg/dL (ref 65–99)
Glucose-Capillary: 174 mg/dL — ABNORMAL HIGH (ref 65–99)

## 2015-01-07 LAB — CREATININE, SERUM
Creatinine, Ser: 1.51 mg/dL — ABNORMAL HIGH (ref 0.61–1.24)
GFR calc Af Amer: 54 mL/min — ABNORMAL LOW (ref >60)
GFR, EST NON AFRICAN AMERICAN: 46 mL/min — AB (ref >60)

## 2015-01-07 LAB — CBC
HCT: 35.9 % — ABNORMAL LOW (ref 40.0–52.0)
HEMOGLOBIN: 11.6 g/dL — AB (ref 13.0–18.0)
MCH: 33.1 pg (ref 26.0–34.0)
MCHC: 32.3 g/dL (ref 32.0–36.0)
MCV: 102.6 fL — ABNORMAL HIGH (ref 80.0–100.0)
PLATELETS: 137 10*3/uL — AB (ref 150–440)
RBC: 3.5 MIL/uL — ABNORMAL LOW (ref 4.40–5.90)
RDW: 14.7 % — ABNORMAL HIGH (ref 11.5–14.5)
WBC: 4.2 10*3/uL (ref 3.8–10.6)

## 2015-01-07 LAB — HEMOGLOBIN A1C: HEMOGLOBIN A1C: 6.7 % — AB (ref 4.0–6.0)

## 2015-01-07 SURGERY — RIGHT AND LEFT HEART CATH
Anesthesia: Moderate Sedation

## 2015-01-07 MED ORDER — INSULIN ASPART 100 UNIT/ML ~~LOC~~ SOLN
0.0000 [IU] | Freq: Every day | SUBCUTANEOUS | Status: DC
  Administered 2015-01-07: 2 [IU] via SUBCUTANEOUS

## 2015-01-07 MED ORDER — SODIUM CHLORIDE 0.9 % IJ SOLN
3.0000 mL | Freq: Two times a day (BID) | INTRAMUSCULAR | Status: DC
  Administered 2015-01-08 – 2015-01-09 (×2): 3 mL via INTRAVENOUS

## 2015-01-07 MED ORDER — VENLAFAXINE HCL ER 75 MG PO CP24
150.0000 mg | ORAL_CAPSULE | Freq: Every day | ORAL | Status: DC
  Administered 2015-01-08 – 2015-01-10 (×3): 150 mg via ORAL
  Filled 2015-01-07: qty 2
  Filled 2015-01-07: qty 1
  Filled 2015-01-07 (×5): qty 2

## 2015-01-07 MED ORDER — MIDAZOLAM HCL 2 MG/2ML IJ SOLN
INTRAMUSCULAR | Status: AC
  Filled 2015-01-07: qty 2

## 2015-01-07 MED ORDER — CARVEDILOL 12.5 MG PO TABS
12.5000 mg | ORAL_TABLET | Freq: Two times a day (BID) | ORAL | Status: DC
  Administered 2015-01-08 – 2015-01-09 (×2): 12.5 mg via ORAL
  Filled 2015-01-07 (×3): qty 1

## 2015-01-07 MED ORDER — FUROSEMIDE 10 MG/ML IJ SOLN
60.0000 mg | Freq: Two times a day (BID) | INTRAMUSCULAR | Status: DC
  Administered 2015-01-07 – 2015-01-08 (×2): 60 mg via INTRAVENOUS
  Filled 2015-01-07 (×2): qty 6

## 2015-01-07 MED ORDER — FENTANYL CITRATE (PF) 100 MCG/2ML IJ SOLN
INTRAMUSCULAR | Status: AC
  Filled 2015-01-07: qty 2

## 2015-01-07 MED ORDER — ENOXAPARIN SODIUM 40 MG/0.4ML ~~LOC~~ SOLN
40.0000 mg | SUBCUTANEOUS | Status: DC
  Administered 2015-01-08 – 2015-01-10 (×3): 40 mg via SUBCUTANEOUS
  Filled 2015-01-07 (×3): qty 0.4

## 2015-01-07 MED ORDER — SODIUM CHLORIDE 0.9 % IJ SOLN: 3.0000 mL | INTRAMUSCULAR | Status: DC | PRN

## 2015-01-07 MED ORDER — ASPIRIN 81 MG PO TABS
81.0000 mg | ORAL_TABLET | Freq: Every day | ORAL | Status: DC
  Filled 2015-01-07: qty 1

## 2015-01-07 MED ORDER — SODIUM CHLORIDE 0.9 % IJ SOLN
3.0000 mL | Freq: Two times a day (BID) | INTRAMUSCULAR | Status: DC
  Administered 2015-01-07 – 2015-01-08 (×3): 3 mL via INTRAVENOUS

## 2015-01-07 MED ORDER — FENTANYL CITRATE (PF) 100 MCG/2ML IJ SOLN
INTRAMUSCULAR | Status: DC | PRN
  Administered 2015-01-07: 50 ug via INTRAVENOUS

## 2015-01-07 MED ORDER — IOHEXOL 300 MG/ML  SOLN
INTRAMUSCULAR | Status: DC | PRN
  Administered 2015-01-07: 100 mL via INTRAVENOUS
  Administered 2015-01-07: 50 mL via INTRAVENOUS

## 2015-01-07 MED ORDER — MIDAZOLAM HCL 2 MG/2ML IJ SOLN
INTRAMUSCULAR | Status: DC | PRN
  Administered 2015-01-07: 1 mg via INTRAVENOUS

## 2015-01-07 MED ORDER — SODIUM CHLORIDE 0.9 % IJ SOLN: 3.0000 mL | Freq: Two times a day (BID) | INTRAMUSCULAR | Status: DC

## 2015-01-07 MED ORDER — HEPARIN SODIUM (PORCINE) 1000 UNIT/ML IJ SOLN
INTRAMUSCULAR | Status: AC
  Filled 2015-01-07: qty 1

## 2015-01-07 MED ORDER — VERAPAMIL HCL 2.5 MG/ML IV SOLN
INTRAVENOUS | Status: DC | PRN
  Administered 2015-01-07: 10:00:00 via INTRA_ARTERIAL

## 2015-01-07 MED ORDER — FLUTICASONE PROPIONATE 50 MCG/ACT NA SUSP
2.0000 | Freq: Every day | NASAL | Status: DC
  Administered 2015-01-10: 2 via NASAL
  Filled 2015-01-07: qty 16

## 2015-01-07 MED ORDER — ALLOPURINOL 100 MG PO TABS
100.0000 mg | ORAL_TABLET | Freq: Every day | ORAL | Status: DC
  Administered 2015-01-08 – 2015-01-10 (×3): 100 mg via ORAL
  Filled 2015-01-07 (×3): qty 1

## 2015-01-07 MED ORDER — HEPARIN SODIUM (PORCINE) 1000 UNIT/ML IJ SOLN
INTRAMUSCULAR | Status: DC | PRN
  Administered 2015-01-07: 4000 [IU] via INTRAVENOUS

## 2015-01-07 MED ORDER — INSULIN GLARGINE 100 UNIT/ML ~~LOC~~ SOLN
25.0000 [IU] | Freq: Every day | SUBCUTANEOUS | Status: DC
  Administered 2015-01-07 – 2015-01-09 (×3): 25 [IU] via SUBCUTANEOUS
  Filled 2015-01-07 (×4): qty 0.25

## 2015-01-07 MED ORDER — VITAMIN D (ERGOCALCIFEROL) 1.25 MG (50000 UNIT) PO CAPS
50000.0000 [IU] | ORAL_CAPSULE | ORAL | Status: DC
  Filled 2015-01-07: qty 1

## 2015-01-07 MED ORDER — SPIRONOLACTONE 12.5 MG HALF TABLET
12.5000 mg | ORAL_TABLET | Freq: Every day | ORAL | Status: DC
  Administered 2015-01-08 – 2015-01-09 (×2): 12.5 mg via ORAL
  Filled 2015-01-07 (×7): qty 1

## 2015-01-07 MED ORDER — ACETAMINOPHEN 325 MG PO TABS
650.0000 mg | ORAL_TABLET | ORAL | Status: DC | PRN
  Administered 2015-01-07: 650 mg via ORAL
  Filled 2015-01-07: qty 2

## 2015-01-07 MED ORDER — VERAPAMIL HCL 2.5 MG/ML IV SOLN
INTRAVENOUS | Status: AC
  Filled 2015-01-07: qty 2

## 2015-01-07 MED ORDER — ASPIRIN EC 81 MG PO TBEC
81.0000 mg | DELAYED_RELEASE_TABLET | Freq: Every day | ORAL | Status: DC
  Administered 2015-01-08 – 2015-01-10 (×3): 81 mg via ORAL
  Filled 2015-01-07 (×6): qty 1

## 2015-01-07 MED ORDER — SODIUM CHLORIDE 0.9 % IV SOLN
INTRAVENOUS | Status: DC
  Administered 2015-01-07: 12:00:00 via INTRAVENOUS

## 2015-01-07 MED ORDER — CLOPIDOGREL BISULFATE 75 MG PO TABS
75.0000 mg | ORAL_TABLET | Freq: Every day | ORAL | Status: DC
  Administered 2015-01-08 – 2015-01-10 (×3): 75 mg via ORAL
  Filled 2015-01-07 (×3): qty 1

## 2015-01-07 MED ORDER — TRAZODONE HCL 100 MG PO TABS
100.0000 mg | ORAL_TABLET | Freq: Every day | ORAL | Status: DC
  Administered 2015-01-07 – 2015-01-09 (×3): 100 mg via ORAL
  Filled 2015-01-07 (×4): qty 1

## 2015-01-07 MED ORDER — ONDANSETRON HCL 4 MG/2ML IJ SOLN: 4.0000 mg | Freq: Four times a day (QID) | INTRAMUSCULAR | Status: DC | PRN

## 2015-01-07 MED ORDER — SODIUM FLUORIDE 1.1 (0.5 F) MG/ML PO SOLN: 1.0000 [drp] | Freq: Every day | ORAL | Status: DC

## 2015-01-07 MED ORDER — ASPIRIN 81 MG PO CHEW: 81.0000 mg | CHEWABLE_TABLET | ORAL | Status: DC

## 2015-01-07 MED ORDER — PANTOPRAZOLE SODIUM 40 MG PO TBEC
40.0000 mg | DELAYED_RELEASE_TABLET | Freq: Every day | ORAL | Status: DC
  Administered 2015-01-10: 40 mg via ORAL
  Filled 2015-01-07 (×2): qty 1

## 2015-01-07 MED ORDER — SODIUM CHLORIDE 0.9 % IV SOLN: 250.0000 mL | INTRAVENOUS | Status: DC | PRN

## 2015-01-07 MED ORDER — ATORVASTATIN CALCIUM 20 MG PO TABS
80.0000 mg | ORAL_TABLET | Freq: Every day | ORAL | Status: DC
  Administered 2015-01-08 – 2015-01-10 (×3): 80 mg via ORAL
  Filled 2015-01-07: qty 1
  Filled 2015-01-07 (×6): qty 4

## 2015-01-07 MED ORDER — HEPARIN (PORCINE) IN NACL 2-0.9 UNIT/ML-% IJ SOLN
INTRAMUSCULAR | Status: AC
  Filled 2015-01-07: qty 1000

## 2015-01-07 MED ORDER — INSULIN ASPART 100 UNIT/ML ~~LOC~~ SOLN
0.0000 [IU] | Freq: Three times a day (TID) | SUBCUTANEOUS | Status: DC
  Administered 2015-01-08 – 2015-01-09 (×2): 2 [IU] via SUBCUTANEOUS
  Administered 2015-01-10: 1 [IU] via SUBCUTANEOUS
  Filled 2015-01-07 (×3): qty 2
  Filled 2015-01-07: qty 1

## 2015-01-07 MED ORDER — LOSARTAN POTASSIUM 50 MG PO TABS
50.0000 mg | ORAL_TABLET | Freq: Every day | ORAL | Status: DC
  Administered 2015-01-08: 50 mg via ORAL
  Filled 2015-01-07: qty 1

## 2015-01-07 SURGICAL SUPPLY — 12 items
CATH BALLN WEDGE 5F 110CM (CATHETERS) ×3 IMPLANT
CATH INFINITI 5FR ANG PIGTAIL (CATHETERS) ×3 IMPLANT
CATH INFINITI JR4 5F (CATHETERS) ×3 IMPLANT
CATH OPTITORQUE JACKY 4.0 5F (CATHETERS) ×6 IMPLANT
CATH SWANZ 7F THERMO (CATHETERS) ×3 IMPLANT
DEVICE RAD TR BAND REGULAR (VASCULAR PRODUCTS) ×3 IMPLANT
GLIDESHEATH SLEND SS 6F .021 (SHEATH) ×9 IMPLANT
KIT MANI 3VAL PERCEP (MISCELLANEOUS) ×6 IMPLANT
KIT RIGHT HEART (MISCELLANEOUS) ×3 IMPLANT
PACK CARDIAC CATH (CUSTOM PROCEDURE TRAY) ×3 IMPLANT
WIRE NITINOL .018 (WIRE) ×3 IMPLANT
WIRE SAFE-T 1.5MM-J .035X260CM (WIRE) ×3 IMPLANT

## 2015-01-07 NOTE — H&P (View-Only) (Signed)
 Cardiology Office Note:  Date of Encounter: 01/02/2015  ID: Kyle Morton, DOB 07/22/1951, MRN [id number]  PCP:  TEGEN, LANCE, PA Primary Cardiologist:  Dr. Gollan, MD  Chief Complaint  Patient presents with  . other    F/u echo/carotid us c/o sob and chest pressure. Meds reviewed verbally with pt.    HPI:  64 year old male with history of CAD s/p multiple stenting, also with history of chronic combined systolic and diastolic CHF, HTN, HLD, and DM2 who comes to clinic today for follow up of his SOB and abnormal echo.     He has followed by the [facility], with clearance to be seen by [facility].   He has known CAD s/p Xience stent placed to his D1 in 2007, a 2.5 x 15 mm stent placed to his OM3 in 2013, and 3 stents placed in 2015, though details of his coronary anatomy remain unavailable at this time. Baseline EF reported to be 30-35% at the time of his outpatient office visit on 09/19/2014. While he was being followed at the [facility] his Lasix was increased to 40 mg q AM and 20 mg q PM for a short time period with no significant improvement in his symptoms, though he was drinking more fluids at the same time. His weight had been stable in the 160's. He was also told while he was being treated at the [facility] his BNP was elevated, though we do not have a value on file.   At his last outpatient follow up on 09/19/2014 he reported his SOB was somewhat improved on Lasix 40 mg bid. He also noted his abdominal bloating was somewhat better. He did still note some chest congestion. He was still coming down from a recent URI and was taking Mucinex. His weight had been fluctuating around 156 to 164. At that visit his weight was up 2 pounds from his prior visit. His ideal weight was felt to be in the mid 150's. He was changed to torsemide and placed on a sliding scale torsemide regimen as follows: for weight less than 156 pounds, take torsemide 10 mg a day. For weight  greater than 156 take torsemide 20 mg once a day. For weight greater than 160 take torsemide 20 mg twice a day. For weight greater than 165 take torsemide 40 mg twice a day. He was scheduled for an echo which showed EF 20-25%, diffuse HK,doppler parameters are consistent with restrictive physiology, indicative of decreased left ventricular diastolic compliance and/or increased left atrial pressure. Moderate MR, left atrium was severely dilated at 46 mm, moderate TR/PR, PASP severely increased at 70 mm Hg. The Inferior vena cava was dilated. The respirophasic diameter changes were blunted (< 50%), consistent with elevated central venous pressure. He reported worsening SOB and wanted to be seen soon.   He comes in stating he feels the same way he did back in 2015 when [facility]. He reports increased dyspnea, fatigue, and dizziness. To the point that he gets fatigued when brushing his teeth. Review of his weight and BP log shows over the past week he has gradually been coming down to his goal weight of the mid 150's and currently weighed at 153 this morning at his home scale. For the past 3 days he has only had to take torsemide 10 mg daily and has not had to take greater than 20 mg daily total since he was changed. He reports   significant improvement in his lower extremity edema, though his dyspnea remains. No orthopnea or cough. He has been limiting he po fluids and salt intake. He was previously in cardiac rehab but self discontinued this. He has never been a smoker or used illicit drugs. He previously drank heavy when he was in the [employer] but has not drank alcohol in years.     Past Medical History  Diagnosis Date  . Pleural effusion   . Chronic combined systolic and diastolic CHF (congestive heart failure)     a. reported baseline EF 30-35%; b. echo 12/2014: EF 20-25%, diffuse HK, restrictive filling pattern, severely dilated LA, mod MR/TR/PR, PASP 70 mm Hg, IVC dilted c/w elevated  CVP  . Coronary artery disease     a. xience stent to D1 in 2007; 2.5 x 15 mm stent placed to OM3 in 2013, w/ 3 stents placed in 2015 (details not available)  . Benign essential HTN   . Hyperlipidemia   . DM2 (diabetes mellitus, type 2)   . PVC's (premature ventricular contractions)   . Vitamin D deficiency   . Gout     feet   . GERD (gastroesophageal reflux disease)   . Carotid artery stenosis     a. carotid dopplers 12/2014: less than 39% ICA bilaterally, f/u 1 year  :  Past Surgical History  Procedure Laterality Date  . Cardiac catheterization  2013,2007  . Coronary angioplasty with stent placement  06/05/2012    OM3  . Coronary angioplasty with stent placement  04/25/2006    RAD  :  Social History:  The patient  reports that he has never smoked. He has never used smokeless tobacco. He reports that he does not drink alcohol or use illicit drugs.   Family History  Problem Relation Age of Onset  . Hypertension Mother   . Hypertension Father   . Hypertension Brother      Allergies:  Allergies  Allergen Reactions  . Lisinopril Cough     Home Medications:  Current Outpatient Prescriptions  Medication Sig Dispense Refill  . allopurinol (ZYLOPRIM) 100 MG tablet Take 100 mg by mouth daily.    . aspirin 81 MG tablet Take 81 mg by mouth daily.    . atorvastatin (LIPITOR) 80 MG tablet Take 80 mg by mouth daily.    . carvedilol (COREG) 12.5 MG tablet Take 12.5 mg by mouth 2 (two) times daily with a meal.    . cetirizine (ZYRTEC) 10 MG tablet Take 10 mg by mouth daily.    . chlorhexidine (PERIDEX) 0.12 % solution Use as directed 15 mLs in the mouth or throat 2 (two) times daily.    . clopidogrel (PLAVIX) 75 MG tablet Take 75 mg by mouth daily.    . colchicine 0.6 MG tablet Take 0.6 mg by mouth as needed.     . dextrose (GLUTOSE) 40 % GEL Take 1 Tube by mouth once as needed for low blood sugar.    . fluticasone (FLONASE) 50 MCG/ACT nasal spray Place 2 sprays into both  nostrils daily.    . insulin glargine (LANTUS) 100 UNIT/ML injection Inject 25 Units into the skin at bedtime.    . losartan (COZAAR) 50 MG tablet Take 50 mg by mouth daily.    . Menthol-Methyl Salicylate (THERA-GESIC EX) Apply topically 2 (two) times daily.    . omeprazole (PRILOSEC) 20 MG capsule Take 40 mg by mouth daily.    . sodium fluoride (LURIDE) 1.1 (0.5 F) MG/ML SOLN Take   1 drop by mouth daily.    . spironolactone (ALDACTONE) 25 MG tablet Take 12.5 mg by mouth daily.    . torsemide (DEMADEX) 20 MG tablet Take 1 tablet (20 mg total) by mouth 2 (two) times daily. 180 tablet 3  . traZODone (DESYREL) 100 MG tablet Take 100 mg by mouth at bedtime.    . venlafaxine XR (EFFEXOR-XR) 150 MG 24 hr capsule Take 150 mg by mouth daily with breakfast.    . Vitamin D, Cholecalciferol, 1000 UNITS CAPS Take 2,000 Units by mouth daily.    . Vitamin D, Ergocalciferol, (DRISDOL) 50000 UNITS CAPS capsule Take 50,000 Units by mouth every 7 (seven) days.     No current facility-administered medications for this visit.     Review of Systems:  Review of Systems  Constitutional: Positive for weight loss and malaise/fatigue. Negative for fever, chills and diaphoresis.       To goal of mid 150's through diuretic usage - still SOB  Eyes: Negative for photophobia, pain, discharge and redness.  Respiratory: Positive for cough and shortness of breath. Negative for hemoptysis, sputum production and wheezing.   Cardiovascular: Negative for chest pain, palpitations, orthopnea, claudication, leg swelling and PND.  Gastrointestinal: Positive for heartburn and nausea. Negative for vomiting, abdominal pain, diarrhea and constipation.  Genitourinary: Negative for hematuria.  Musculoskeletal: Negative for myalgias and falls.  Skin: Negative for rash.  Neurological: Positive for dizziness and weakness. Negative for sensory change, speech change, focal weakness and loss of consciousness.  Psychiatric/Behavioral:  Negative for depression, hallucinations and substance abuse. The patient is not nervous/anxious.   All other systems reviewed and are negative.    Physical Exam:  Blood pressure 110/84, pulse 62, height 5' 6" (1.676 m), weight 158 lb (71.668 kg). Body mass index is 25.51 kg/(m^2). General: Pleasant, NAD. Psych: Normal affect. Neuro: Alert and oriented X 3. Moves all extremities spontaneously. HEENT: Normocephalic, atraumatic. EOM intact bilaterally.   Neck: Supple without bruits or JVD. Lungs:  Faint crackles at bilateral bases, otherwise without wheezing or rhonchi. Breathing is unlabored.  Heart: RRR no s3, s4. II/VI systolic murmur at apex.  No rubs or gallops.  Abdomen: Soft, non-tender, non-distended, BS + x 4.  Extremities: No clubbing, cyanosis. Trace non-pitting edema to the mid calves bilaterally. DP/PT/Radials 2+ and equal bilaterally.   Accessory Clinical Findings:  EKG - NSR, 62 bpm, left axis deviation, left anterior fascicular block, frequent PVCs, poor R wave progression, T wave abnormality along anterolateral leads   Other studies Reviewed: Additional studies/ records that were reviewed today include: prior office visit, echo, and carotid doppler.   Recent Labs: 09/13/2014: BUN 21; Creatinine 1.25; Potassium 4.4; Sodium 140    Lipid Panel No results found for: CHOL, TRIG, HDL, CHOLHDL, VLDL, LDLCALC, LDLDIRECT   Weights: Wt Readings from Last 3 Encounters:  01/02/15 158 lb (71.668 kg)  05/21/14 163 lb 4.8 oz (74.072 kg)  11/07/14 159 lb (72.122 kg)     Assessment & Plan:  1. Pulmonary HTN/Cor pulmonale:  -Recent echo 12/24/2014 showed EF 20-25%, diffuse HK, PASP 70 mmHg, restrictive filling pattern, after being transitioned from Lasix to torsemide, continued dyspnea, fatigue, and dizziness  -Schedule for right and left heart cath  -Possible referral to advanced heart failure team once heart cath is complete -Continue torsemide at this time per current  regimen  -Continue spironolactone, losartan, and carvedilol  -BP and pulse preclude titration of medications at this time   2. ICM/CAD s/p multiple stenting as above:  -  LHC and medications as above -Continue DAPT with aspirin and Plavix  -Continue Imdur 60 mg daily  4. Recent bronchitis:  -Resolved  5. HTN:  -Well controlled -Continue current medications as above   6. HLD:  -Continue Lipitor 80 mg   7. DM2:  -Per PCP   Dispo: -RHC/LHC -Follow up pending the above   Malin Cervini, PA-C [facility] [address] [place], [place] [address] [phone] [facility] 01/02/2015, 4:46 PM   

## 2015-01-07 NOTE — Interval H&P Note (Signed)
History and Physical Interval Note:  01/07/2015 9:20 AM  Kyle MilroyWillie Skog  has presented today for surgery, with the diagnosis of Chest pain  The various methods of treatment have been discussed with the patient and family. After consideration of risks, benefits and other options for treatment, the patient has consented to  Procedure(s): Right and Left Heart Cath (N/A) as a surgical intervention .  The patient's history has been reviewed, patient examined, no change in status, stable for surgery.  I have reviewed the patient's chart and labs.  Questions were answered to the patient's satisfaction.     Lorine BearsMuhammad Arida

## 2015-01-08 ENCOUNTER — Ambulatory Visit: Payer: Non-veteran care

## 2015-01-08 ENCOUNTER — Encounter: Payer: Self-pay | Admitting: Cardiovascular Disease

## 2015-01-08 DIAGNOSIS — E785 Hyperlipidemia, unspecified: Secondary | ICD-10-CM

## 2015-01-08 DIAGNOSIS — I10 Essential (primary) hypertension: Secondary | ICD-10-CM

## 2015-01-08 DIAGNOSIS — I255 Ischemic cardiomyopathy: Secondary | ICD-10-CM

## 2015-01-08 DIAGNOSIS — I5023 Acute on chronic systolic (congestive) heart failure: Principal | ICD-10-CM

## 2015-01-08 DIAGNOSIS — I2510 Atherosclerotic heart disease of native coronary artery without angina pectoris: Secondary | ICD-10-CM

## 2015-01-08 DIAGNOSIS — E1159 Type 2 diabetes mellitus with other circulatory complications: Secondary | ICD-10-CM

## 2015-01-08 DIAGNOSIS — Z9861 Coronary angioplasty status: Secondary | ICD-10-CM

## 2015-01-08 LAB — BASIC METABOLIC PANEL
Anion gap: 8 (ref 5–15)
BUN: 26 mg/dL — AB (ref 6–20)
CO2: 24 mmol/L (ref 22–32)
CREATININE: 1.54 mg/dL — AB (ref 0.61–1.24)
Calcium: 9.2 mg/dL (ref 8.9–10.3)
Chloride: 108 mmol/L (ref 101–111)
GFR calc Af Amer: 53 mL/min — ABNORMAL LOW (ref >60)
GFR, EST NON AFRICAN AMERICAN: 45 mL/min — AB (ref >60)
GLUCOSE: 104 mg/dL — AB (ref 65–99)
Potassium: 3.4 mmol/L — ABNORMAL LOW (ref 3.5–5.1)
Sodium: 140 mmol/L (ref 135–145)

## 2015-01-08 LAB — GLUCOSE, CAPILLARY
GLUCOSE-CAPILLARY: 150 mg/dL — AB (ref 65–99)
Glucose-Capillary: 93 mg/dL (ref 65–99)
Glucose-Capillary: 195 mg/dL — ABNORMAL HIGH (ref 65–99)
Glucose-Capillary: 156 mg/dL — ABNORMAL HIGH (ref 65–99)

## 2015-01-08 MED ORDER — POTASSIUM CHLORIDE CRYS ER 20 MEQ PO TBCR
20.0000 meq | EXTENDED_RELEASE_TABLET | Freq: Two times a day (BID) | ORAL | Status: DC
  Administered 2015-01-08 – 2015-01-09 (×4): 20 meq via ORAL
  Filled 2015-01-08 (×4): qty 1

## 2015-01-08 NOTE — Progress Notes (Signed)
Patient Name: Kyle Morton Date of Encounter: 01/08/2015     Principal Problem:   Acute on chronic systolic CHF (congestive heart failure), NYHA class 4 Active Problems:   Cardiomyopathy, ischemic   Essential hypertension   Hyperlipidemia   DM2 (diabetes mellitus, type 2)   CAD S/P percutaneous coronary angioplasty    SUBJECTIVE  Admitted yesterday post cath 2/2 marked volume overload with EDP of 27.  Minus 2.8 L overnight.  Breathing improving but he doesn't feel as though it's back to baseline.  No chest pain.  CURRENT MEDS . allopurinol  100 mg Oral Daily  . aspirin EC  81 mg Oral Daily  . atorvastatin  80 mg Oral Daily  . carvedilol  12.5 mg Oral BID WC  . clopidogrel  75 mg Oral Daily  . enoxaparin (LOVENOX) injection  40 mg Subcutaneous Q24H  . fluticasone  2 spray Each Nare Daily  . furosemide  60 mg Intravenous BID  . insulin aspart  0-5 Units Subcutaneous QHS  . insulin aspart  0-9 Units Subcutaneous TID WC  . insulin glargine  25 Units Subcutaneous QHS  . losartan  50 mg Oral Daily  . pantoprazole  40 mg Oral Daily  . potassium chloride  20 mEq Oral BID  . sodium chloride  3 mL Intravenous Q12H  . sodium chloride  3 mL Intravenous Q12H  . sodium fluoride  1 drop Oral Daily  . spironolactone  12.5 mg Oral Daily  . traZODone  100 mg Oral QHS  . venlafaxine XR  150 mg Oral Q breakfast  . Vitamin D (Ergocalciferol)  50,000 Units Oral Q7 days    OBJECTIVE  Filed Vitals:   01/07/15 2031 01/08/15 0504 01/08/15 0749 01/08/15 0824  BP: 130/79 147/93  155/108  Pulse: 63 69  76  Temp: 97.5 F (36.4 C) 98.2 F (36.8 C)  98.2 F (36.8 C)  TempSrc: Oral Oral  Oral  Resp:  18  18  Height:      Weight:   169 lb 8 oz (76.885 kg) 154 lb 12.2 oz (70.2 kg)  SpO2: 100% 100%  100%    Intake/Output Summary (Last 24 hours) at 01/08/15 0934 Last data filed at 01/08/15 0800  Gross per 24 hour  Intake    120 ml  Output   2975 ml  Net  -2855 ml   Filed Weights   01/07/15 1434 01/08/15 0749 01/08/15 0824  Weight: 165 lb 14.4 oz (75.252 kg) 169 lb 8 oz (76.885 kg) 154 lb 12.2 oz (70.2 kg)    PHYSICAL EXAM  General: Pleasant, NAD. Neuro: Alert and oriented X 3. Moves all extremities spontaneously. Psych: Normal affect. HEENT:  Normal  Neck: Supple without bruits.  JVD to jaw. Lungs:  Resp regular and unlabored, few basilar crackles initially - cleared with deep breathing. Heart: Irreg, no s3, s4, or murmurs. Abdomen: Soft, non-tender, non-distended, BS + x 4.  Extremities: No clubbing, cyanosis or edema. DP/PT/Radials 2+ and equal bilaterally. R wrist cath site w/o bleeding/bruit/hematoma.  Accessory Clinical Findings  CBC  Recent Labs  01/07/15 1451  WBC 4.2  HGB 11.6*  HCT 35.9*  MCV 102.6*  PLT 137*   Basic Metabolic Panel  Recent Labs  01/07/15 1451 01/08/15 0347  NA  --  140  K  --  3.4*  CL  --  108  CO2  --  24  GLUCOSE  --  104*  BUN  --  26*  CREATININE 1.51* 1.54*  CALCIUM  --  9.2   Hemoglobin A1C  Recent Labs  01/07/15 1451  HGBA1C 6.7*   TELE  Sinus, pvc's, couplets.  Radiology/Studies  Dg Chest 2 View  01/03/2015   CLINICAL DATA:  Pre cardiac catheterization  EXAM: CHEST  2 VIEW  COMPARISON:  None.  FINDINGS: Mild cardiac enlargement. No pleural effusion or edema. Both lungs are clear. The visualized skeletal structures are unremarkable.  IMPRESSION: 1. No acute cardiopulmonary abnormalities.   Electronically Signed   By: Signa Kellaylor  Stroud M.D.   On: 01/03/2015 07:55    ASSESSMENT AND PLAN  1.  Acute on chronic systolic chf/ICM:  EF 20-25% by recent echo.  Cath yesterday revealed severe ostial Diag dzs with patent LAD, midDiag, LCX, OM2, and OM3 stents.  EDP measured @ 27.  CO 3.14, CI 1.75.  He was admitted for diuresis last night - minus 2.8 L.  Wt listed @ 154 lbs - bed scale.  Says baseline ~ 155 (but @ that wt, EDP is 27).  Still with evidence of volume overload. Cont IV lasix today.  Creat up  slightly after contrast yesterday.  Will hold ARB.  Cont bb.  Will eventually require EP eval for consideration of ICD - depends on whether or not revascularization performed.  2.  CAD:  Cath yesterday as above.  Severe ostial diag dzs with 60% LAD dzs @ bifurcation.  Dr. Kirke CorinArida to consider flow wire guided PCI once volume stable.  No chest pain. Cont asa, statin, bb, plavix.  I will keep NPO after midnight in case we decide to proceed with intervention tomorrow.  3. HTN:  BP up slightly this AM.  Follow after AM meds and with diuresis.  Holding ARB in light of mild creat elevation.  4.  DM II:  Glucose stable.  A1c 6.7.  Cont current regimen.  5.  Hypokalemia:  Supplementation ordered.   Signed, Nicolasa Duckinghristopher Berge NP   I have seen and evaluated the patient is morning along with Mr. Brion AlimentBerge, NP. We reviewed the chart together and all available data and discussed him on rounds. I agree with his findings were examination recommendations.  Admitted following right left heart catheterization yesterday demonstrating elevated LVEDP/wedge pressure consistent with acute on chronic combined heart failure. He does have some existing severe disease in the diagonal moderate disease in the LAD which could be related to supply versus demand mismatch mediated anginal type chest pain related to diastolic dysfunction. . He has diuresed well overnight and feels somewhat better. We'll continue IV Lasix for a couple more days in order to "tune him up ". Would like to consider short-term follow-up to discuss FFR guided PCI of the LAD-diagonal   With the mild renal insufficiency and recent IV contrast from cardiac I do agree with holding ARB for now until we know his creatinine is doing. Monitor electrolytes. Continue to treat blood pressure for afterload reduction. Is on stable dose of carvedilol, but with holding ARB may need to use hydralazine/nitrate for the short-term interval. .   Marykay LexHARDING, DAVID W, M.D.,  M.S. Interventional Cardiologist   Pager # [phone]

## 2015-01-08 NOTE — Progress Notes (Signed)
Patient is postop day 1 from right and left heart cath. Patient was A&O X4 denied pain or any discomfort. Pink arm sleeve was placed on the right arm, and the cath site is free from hematoma or drainage. Patient is NSR with VS WDL for patient.

## 2015-01-08 NOTE — Progress Notes (Signed)
Initial Nutrition Assessment  DOCUMENTATION CODES:     INTERVENTION:   (Meals and Snacks: Cater to Patient Preferences)  Nutrition Education: Discussed low salt diet principles with patient, sister at bedside including limiting canned goods, added salt while eating/ cooking, and processed/ boxed/ frozen meals. Also reviewed fast food as an additional supply of excess salt.   NUTRITION DIAGNOSIS:  Food and nutrition related knowledge deficit related to limited prior education as evidenced by per patient/family report.  GOAL:  Patient will meet greater than or equal to 90% of their needs  MONITOR:   (Energy Intake, Electrolyte and Renal Profile, Anthropometrics)  REASON FOR ASSESSMENT:   (RD Screen- Dx)    ASSESSMENT:  Reason For Admission: Heart failure PMHx:  Past Medical History  Diagnosis Date  . Pleural effusion   . Chronic combined systolic and diastolic CHF (congestive heart failure)     a. reported baseline EF 30-35%; b. echo 12/2014: EF 20-25%, diffuse HK, restrictive filling pattern, severely dilated LA, mod MR/TR/PR, PASP 70 mm Hg, IVC dilted c/w elevated CVP  . Coronary artery disease     a. xience stent to D1 in 2007; 2.5 x 15 mm stent placed to OM3 in 2013, w/ 3 stents placed in 2015 (details not available);b. 01/2015 Cath: LM nl, LAD 60p, 93m ISR, D1 95ost, 20 ISR, RI min irregs, LCX 20 ISR, OM1 small, OM2 30 ISR, OM3 40 ISR, RCA 100p, EF 15%, 2+ MR, elev R heart pressures.  . Benign essential HTN   . Hyperlipidemia   . DM2 (diabetes mellitus, type 2)   . PVC's (premature ventricular contractions)   . Vitamin D deficiency   . GERD (gastroesophageal reflux disease)   . Carotid artery stenosis     a. carotid dopplers 12/2014: less than 39% ICA bilaterally, f/u 1 year  . Depression   . Gout     feet     Typical Fluid/ Food Intake: 75% of Breakfast noted per I/ O this AM.   Meal/ Snack Patterns: Patient reports eating a regular, low salt, carb controlled  diet PTA. Patient has questions about what items are low in salt for diet. Reviewed.  Supplements: None  Labs:  Electrolyte and Renal Profile:  Recent Labs Lab 01/02/15 1608 01/07/15 1451 01/08/15 0347  BUN 20  --  26*  CREATININE 1.47* 1.51* 1.54*  NA 140  --  140  K 3.9  --  3.4*   Glucose Profile:   Recent Labs  01/07/15 2032 01/08/15 0757 01/08/15 1109  GLUCAP 223* 93 195*   A1C: 6.7  Meds: Lasix, Novolog, Lantus  Physical Findings: n/a  Weight Changes: Patient reports a UBW range of 155-160#. No significant weight changes to note at this time.  Height:  Ht Readings from Last 1 Encounters:  01/07/15  (1.676 m)    Weight:  Wt Readings from Last 1 Encounters:  01/08/15 154 lb 12.2 oz (70.2 kg)    Ideal Body Weight:     Wt Readings from Last 10 Encounters:  01/08/15 154 lb 12.2 oz (70.2 kg)  01/02/15 158 lb (71.668 kg)  05/21/14 163 lb 4.8 oz (74.072 kg)  11/07/14 159 lb (72.122 kg)  09/19/14 170 lb 12 oz (77.452 kg)  09/04/14 168 lb (76.204 kg)    BMI:  Body mass index is 24.99 kg/(m^2).  Skin:  Reviewed, no issues  Diet Order:  Diet Carb Modified Fluid consistency:: Thin; Room service appropriate?: Yes Diet NPO time specified  EDUCATION NEEDS:  Education needs addressed   Intake/Output Summary (Last 24 hours) at 01/08/15 1115 Last data filed at 01/08/15 1110  Gross per 24 hour  Intake    360 ml  Output   3975 ml  Net  -3615 ml    Last BM:  6/7  Joeseph Amorracey L. Gaines, RDN Pager: [phone] Office: [phone]  LOW Care Level

## 2015-01-08 NOTE — Progress Notes (Addendum)
BP 98/63 from 155/108 HR Sinus Rhythm to Sinus Huston FoleyBrady 50-60 Patient feels weak   Kyle BaltimoreHarding called, told to hold afternoon dose of carvedilol and lasix.  Continue to monitor.

## 2015-01-08 NOTE — Care Management (Signed)
[facility] [place] and [place] [place] as needed.  His sister and daughter are very supportive.  Admitted after cardiac cath for shortness of breath.  Admitted with acute on chronic exac chf.  He is requiring IV lasix.  Patient has EF of 15%.  It is reported that patient had PCI but cath report indicates this was not pursued.

## 2015-01-09 LAB — BASIC METABOLIC PANEL
ANION GAP: 8 (ref 5–15)
BUN: 21 mg/dL — AB (ref 6–20)
CO2: 27 mmol/L (ref 22–32)
Calcium: 8.9 mg/dL (ref 8.9–10.3)
Chloride: 102 mmol/L (ref 101–111)
Creatinine, Ser: 1.33 mg/dL — ABNORMAL HIGH (ref 0.61–1.24)
GFR calc Af Amer: >60 mL/min (ref >60)
GFR, EST NON AFRICAN AMERICAN: 54 mL/min — AB (ref >60)
Glucose, Bld: 151 mg/dL — ABNORMAL HIGH (ref 65–99)
Potassium: 3.9 mmol/L (ref 3.5–5.1)
Sodium: 137 mmol/L (ref 135–145)

## 2015-01-09 LAB — GLUCOSE, CAPILLARY
GLUCOSE-CAPILLARY: 114 mg/dL — AB (ref 65–99)
GLUCOSE-CAPILLARY: 149 mg/dL — AB (ref 65–99)
Glucose-Capillary: 99 mg/dL (ref 65–99)

## 2015-01-09 MED ORDER — TORSEMIDE 20 MG PO TABS
40.0000 mg | ORAL_TABLET | Freq: Two times a day (BID) | ORAL | Status: DC
  Administered 2015-01-09 (×2): 40 mg via ORAL
  Filled 2015-01-09 (×2): qty 2

## 2015-01-09 NOTE — Progress Notes (Signed)
Patient Name: Kyle Morton Date of Encounter: 01/09/2015    Principal Problem:   Acute on chronic systolic CHF (congestive heart failure), NYHA class 4 Active Problems:   Cardiomyopathy, ischemic   Essential hypertension   Hyperlipidemia   DM2 (diabetes mellitus, type 2)   CAD S/P percutaneous coronary angioplasty    SUBJECTIVE  Breathing much improved overall.  Developed mild orthostasis yesterday resulting in holding of PM doses of coreg and lasix.  BP stable this AM.  No recurrence of lightheadedness.    CURRENT MEDS . allopurinol  100 mg Oral Daily  . aspirin EC  81 mg Oral Daily  . atorvastatin  80 mg Oral Daily  . carvedilol  12.5 mg Oral BID WC  . clopidogrel  75 mg Oral Daily  . enoxaparin (LOVENOX) injection  40 mg Subcutaneous Q24H  . fluticasone  2 spray Each Nare Daily  . furosemide  60 mg Intravenous BID  . insulin aspart  0-5 Units Subcutaneous QHS  . insulin aspart  0-9 Units Subcutaneous TID WC  . insulin glargine  25 Units Subcutaneous QHS  . pantoprazole  40 mg Oral Daily  . potassium chloride  20 mEq Oral BID  . sodium chloride  3 mL Intravenous Q12H  . sodium chloride  3 mL Intravenous Q12H  . sodium fluoride  1 drop Oral Daily  . spironolactone  12.5 mg Oral Daily  . traZODone  100 mg Oral QHS  . venlafaxine XR  150 mg Oral Q breakfast  . Vitamin D (Ergocalciferol)  50,000 Units Oral Q7 days    OBJECTIVE  Filed Vitals:   01/08/15 2015 01/09/15 0019 01/09/15 0438 01/09/15 0741  BP: 137/87 109/77 122/92 122/90  Pulse: 61 57 55 62  Temp: 98.3 F (36.8 C) 97.5 F (36.4 C) 97.7 F (36.5 C) 97.7 F (36.5 C)  TempSrc: Oral Oral Oral Oral  Resp: Height:      Weight:      SpO2: 98% 99% 100% 100%    Intake/Output Summary (Last 24 hours) at 01/09/15 0754 Last data filed at 01/09/15 0744  Gross per 24 hour  Intake    480 ml  Output   2775 ml  Net  -2295 ml   Filed Weights   01/07/15 1434 01/08/15 0749 01/08/15 0824  Weight:  165 lb 14.4 oz (75.252 kg) 169 lb 8 oz (76.885 kg) 154 lb 12.2 oz (70.2 kg)    PHYSICAL EXAM  General: Pleasant, NAD. Neuro: Alert and oriented X 3. Moves all extremities spontaneously. Psych: Normal affect. HEENT:  Normal  Neck: Supple without bruits.  JVP ~ 10-12 cm. Lungs:  Resp regular and unlabored, CTA. Heart: RRR no s3, s4, or murmurs. Abdomen: Soft, non-tender, non-distended, BS + x 4.  Extremities: No clubbing, cyanosis or edema. DP/PT/Radials 2+ and equal bilaterally.  R radial cath site w/o bleeding/bruit/hematoma.  Accessory Clinical Findings  CBC  Recent Labs  01/07/15 1451  WBC 4.2  HGB 11.6*  HCT 35.9*  MCV 102.6*  PLT 137*   Basic Metabolic Panel  Recent Labs  01/08/15 0347 01/09/15 0347  NA 140 137  K 3.4* 3.9  CL 108 102  CO2 24 27  GLUCOSE 104* 151*  BUN 26* 21*  CREATININE 1.54* 1.33*  CALCIUM 9.2 8.9   Hemoglobin A1C  Recent Labs  01/07/15 1451  HGBA1C 6.7*   TELE  Rsr, pac's, pvc's, couplets.  Radiology/Studies  Dg Chest 2 View  01/03/2015  CLINICAL DATA:  Pre cardiac catheterization  EXAM: CHEST  2 VIEW  COMPARISON:  None.  FINDINGS: Mild cardiac enlargement. No pleural effusion or edema. Both lungs are clear. The visualized skeletal structures are unremarkable.  IMPRESSION: 1. No acute cardiopulmonary abnormalities.   Electronically Signed   By: Signa Kell M.D.   On: 01/03/2015 07:55    ASSESSMENT AND PLAN  1. Acute on chronic systolic chf/ICM: EF 20-25% by recent echo. Cath yesterday revealed severe ostial Diag dzs with patent LAD, midDiag, LCX, OM2, and OM3 stents. EDP measured @ 27. CO 3.14, CI 1.75. He was admitted for diuresis - minus 4.7 L since admission. Wt pending this AM. Feeling much better.  Will switch to PO diuretic today.  Was previously on torsemide 20 bid will convert to 40 bid given degree of volume overload on prior dose.  Cont bb.  ARB on hold in setting of mild renal insuff in setting of diuresis and  contrast.  Ambulate today.  If stable, probable d/c tomorrow.  Resume ARB on d/c if creat stable.  Will eventually require EP eval for consideration of ICD - depends on whether or not/timing of revascularization performed.  2. CAD: Cath 6/7 as above. Severe ostial diag dzs with 60% LAD dzs @ bifurcation. Dr. Kirke Corin to consider flow wire guided PCI at later date. No chest pain. Cont asa, statin, bb, plavix.  3. HTN: BP stable to low.  ARB and pm dose of bb held yesterday.  Follow today.  Switching diuretics to PO.  4. DM II: Glucose stable. A1c 6.7. Cont current regimen.  5. Hypokalemia: Stable on PO KCl.  Signed, Nicolasa Ducking NP

## 2015-01-09 NOTE — Progress Notes (Signed)
Spoke to Nicolasa Ducking, NP and pt okay to take coreg unless HR goes below 50.   Cristela Felt, RN

## 2015-01-10 ENCOUNTER — Other Ambulatory Visit: Payer: Self-pay | Admitting: Nurse Practitioner

## 2015-01-10 ENCOUNTER — Ambulatory Visit: Payer: Non-veteran care

## 2015-01-10 ENCOUNTER — Encounter: Payer: Self-pay | Admitting: Nurse Practitioner

## 2015-01-10 DIAGNOSIS — N183 Chronic kidney disease, stage 3 (moderate): Secondary | ICD-10-CM

## 2015-01-10 LAB — GLUCOSE, CAPILLARY
GLUCOSE-CAPILLARY: 59 mg/dL — AB (ref 65–99)
GLUCOSE-CAPILLARY: 193 mg/dL — AB (ref 65–99)
GLUCOSE-CAPILLARY: 136 mg/dL — AB (ref 65–99)

## 2015-01-10 LAB — BASIC METABOLIC PANEL
ANION GAP: 9 (ref 5–15)
BUN: 19 mg/dL (ref 6–20)
CO2: 30 mmol/L (ref 22–32)
CREATININE: 1.43 mg/dL — AB (ref 0.61–1.24)
Calcium: 9.3 mg/dL (ref 8.9–10.3)
Chloride: 99 mmol/L — ABNORMAL LOW (ref 101–111)
GFR calc Af Amer: 57 mL/min — ABNORMAL LOW (ref >60)
GFR calc non Af Amer: 50 mL/min — ABNORMAL LOW (ref >60)
Glucose, Bld: 67 mg/dL (ref 65–99)
Potassium: 3.3 mmol/L — ABNORMAL LOW (ref 3.5–5.1)
Sodium: 138 mmol/L (ref 135–145)

## 2015-01-10 MED ORDER — ISOSORBIDE MONONITRATE ER 60 MG PO TB24: 30.0000 mg | ORAL_TABLET | Freq: Every day | ORAL | Status: DC

## 2015-01-10 MED ORDER — NITROGLYCERIN 0.4 MG SL SUBL: 0.4000 mg | SUBLINGUAL_TABLET | SUBLINGUAL | Status: AC | PRN

## 2015-01-10 MED ORDER — MAGNESIUM HYDROXIDE 400 MG/5ML PO SUSP
30.0000 mL | Freq: Every day | ORAL | Status: DC
  Administered 2015-01-10: 30 mL via ORAL
  Filled 2015-01-10: qty 30

## 2015-01-10 MED ORDER — POTASSIUM CHLORIDE CRYS ER 20 MEQ PO TBCR: 20.0000 meq | EXTENDED_RELEASE_TABLET | Freq: Two times a day (BID) | ORAL | Status: AC

## 2015-01-10 MED ORDER — POTASSIUM CHLORIDE CRYS ER 20 MEQ PO TBCR
40.0000 meq | EXTENDED_RELEASE_TABLET | Freq: Two times a day (BID) | ORAL | Status: DC
  Administered 2015-01-10: 40 meq via ORAL
  Filled 2015-01-10: qty 2

## 2015-01-10 MED ORDER — LOSARTAN POTASSIUM 50 MG PO TABS
25.0000 mg | ORAL_TABLET | Freq: Every day | ORAL | Status: DC
  Administered 2015-01-10: 25 mg via ORAL

## 2015-01-10 MED ORDER — LOSARTAN POTASSIUM 25 MG PO TABS: 25.0000 mg | ORAL_TABLET | Freq: Every day | ORAL | Status: DC

## 2015-01-10 MED ORDER — SPIRONOLACTONE 25 MG PO TABS
25.0000 mg | ORAL_TABLET | Freq: Every day | ORAL | Status: DC
Start: 2015-01-10 — End: 2015-01-10
  Administered 2015-01-10: 25 mg via ORAL

## 2015-01-10 NOTE — Care Management (Signed)
For discharge - no needs.  Faxed progress note to [facility]

## 2015-01-10 NOTE — Telephone Encounter (Signed)
req for additional services requested. Patient was advised the day before heart cath that they may not retro.

## 2015-01-10 NOTE — Progress Notes (Signed)
MD Kirke Corin ordered MOM for patient

## 2015-01-10 NOTE — Discharge Summary (Signed)
Discharge Summary   Patient ID: Kyle Morton,  MRN: [id number], DOB/AGE: 09-21-1950 64 y.o.  Admit date: 01/07/2015 Discharge date: 01/10/2015  Primary Care Provider: Toya Morton Primary Cardiologist: Kyle Se, MD   Discharge Diagnoses Principal Problem:   Acute on chronic systolic CHF (congestive heart failure), NYHA class 4  **Net negative diuresis of 8.29 Liters.  **Discharge weight 154 lbs.  Active Problems:   Cardiomyopathy, ischemic  **EF 15% by LV gram this admission.   Essential hypertension   Hyperlipidemia   DM2 (diabetes mellitus, type 2)   CAD S/P prior percutaneous coronary angioplasty   **Cath this admission revealing severe ostial first Diagonal disease pending outpt PCI.  Allergies Allergies  Allergen Reactions  . Lisinopril Cough   Procedures  Cardiac Catheterization 6.7.2016  Coronary Findings     Dominance: Left    Left Main  The vessel is angiographically normal.      Left Anterior Descending   . Prox LAD lesion, 60% stenosed. The lesion is type C, discrete located at major branch.   . Mid LAD lesion, 20% stenosed. The lesion was previously treated with a stent (unknown type) .   Kyle Morton   . Ost 1st Diag to 1st Diag lesion, 95% stenosed. The lesion is type C, discrete .   . 1st Diag lesion, 20% stenosed. The lesion was previously treated with a stent (unknown type) .      Ramus Intermedius  There is mildthe vessel.      Left Circumflex   . Prox Cx to Mid Cx lesion, 20% stenosed. diffuse . The lesion was previously treated with a stent (unknown type) .   Kyle Kitchen First Obtuse Marginal Branch   The vessel is small in size. There is mild.   . Second Obtuse Marginal Branch   . Ost 2nd Mrg to 2nd Mrg lesion, 30% stenosed. The lesion was previously treated with a stent (unknown type) .   Kyle Kitchen Third Biomedical engineer   . Ost 3rd Mrg to 3rd Mrg lesion, 40% stenosed. The lesion was previously treated with a stent (unknown type) .   Kyle Kitchen  Fourth Obtuse Marginal Branch   There is mild.   . Left Posterior Descending Artery   There is mild.      Right Coronary Artery   . Prox RCA lesion, 100% stenosed.         Right Heart Pressures Hemodynamic findings consistent with moderate pulmonary hypertension. Elevated LV EDP consistent with volume overload. Cardiac output: 3.14 .  Cardiac index: 1.75  Left Heart     Left Ventricle The left ventricle is enlarged. There is severe left ventricular systolic dysfunction. The ejection fraction is calculated to be 15%. Ejection fraction quantitative: 15%. There are wall motion abnormalities in the left ventricle. There is global hypokinesis of the left ventricle.    Mitral Valve There is mild (2+) mitral regurgitation.    Aortic Valve There is no aortic valve stenosis.  _____________   History of Present Illness  64 y/o male with a prior h/o CAD s/p multiple PCI's of the LAD, D1, LCX, OM2, and OM3.  He also has a h/o ischemic cardiomyopathy and chronic systolic CHF.  EF on echo in May 2016, was found to be 20-25%.  He was seen in clinic on 6/2 with complaints of progressive fatigue, dyspnea, and dizziness - all occurring with minimal activity.  Due to concern for low output CHF, decision was made to pursue right and left heart cardiac  catheterization.  Hospital Course  Patient presented to the [facility] cath lab on 6/7 and underwent diagnostic catheterization revealing patent LAD, D1, LCX, OM2, and OM3 stents with a chronic total occlusion of the RCA.  The D1 had a new 95% ostial stenosis.  It was felt that this would require a complex PCI at the bifurcation of the LAD and D1.  Decision was made to defer PCI to a later date as Kyle Morton was found to be volume overloaded with low output heart failure (CO 3.14 L/min; CI 1.75 L/min/m^2) and a LVEDP of 27 mmHg.  As a result, he was admitted for aggressive IV diuresis.  He was placed on IV lasix and responded well.  For this admission, he is minus  8.29 L.  His weight has come down from 165 lbs on admission to 154 lbs on discharge.  He was transitioned back to torsemide at 40 mg BID on 6/9.  This was double his prior home dose.  With this, his creatinine elevated slightly from 1.33 to 1.43.  As a result, we have dropped him back to torsemide 20 mg BID and will continue spironolactone at 25 mg daily.  He has required potassium supplementation in the setting of aggressive diuresis and resultant hypokalemia, and for that reason, we will send him home on KCl supplementation.  He has had significant clinical improvement following diuresis and has been ambulating without limitations.  He will be discharged home today in good condition.  We have arrange for a f/u basic metabolic profile on 6/14 to reassess his renal function and potassium.  He will be seen in clinic on 6/23 at which point, if he continues to do well, we will likely schedule outpt PCI of his D1.  Following revascularization, he will likely require outpatient EP referral for consideration of ICD placement.  Discharge Vitals Blood pressure 125/86, pulse 56, temperature 97.7 F (36.5 C), temperature source Oral, resp. rate 18, height 5\' 6"  (1.676 m), weight 171 lb 15.3 oz (78 kg), SpO2 100 %.  Filed Weights   01/08/15 0824 01/09/15 0822 01/10/15 0614  Weight: 154 lb 12.2 oz (70.2 kg) 154 lb 12.2 oz (70.2 kg) 171 lb 15.3 oz (78 kg)    Labs  CBC  Recent Labs  01/07/15 1451  WBC 4.2  HGB 11.6*  HCT 35.9*  MCV 102.6*  PLT 137*   Basic Metabolic Panel  Recent Labs  01/09/15 0347 01/10/15 0458  NA 137 138  K 3.9 3.3*  CL 102 99*  CO2 27 30  GLUCOSE 151* 67  BUN 21* 19  CREATININE 1.33* 1.43*  CALCIUM 8.9 9.3   Hemoglobin A1C  Recent Labs  01/07/15 1451  HGBA1C 6.7*   Disposition  Pt is being discharged home today in good condition.  Follow-up Plans & Appointments      Follow-up Information    Follow up with Kyle Nordmann, MD On 01/23/2015.   Specialty:   Cardiology   Why:  2:30 PM   Contact information:   [address] [place] [address] [phone]       Follow up with Kyle Freeze, FNP On 02/18/2015.   Specialty:  Family Medicine   Why:  11:00 AM   Contact information:   [address] [place] [address] [phone]       Follow up with [facility] On 01/14/2015.   Why:  10:00 AM - lab only (blood chemistry).   Contact information:  [address] [place] [address] [phone]     Discharge Medications    Medication List    TAKE these medications        allopurinol 100 MG tablet  Commonly known as:  ZYLOPRIM  Take 200 mg by mouth daily.     aspirin EC 81 MG tablet  Take 81 mg by mouth daily.     atorvastatin 80 MG tablet  Commonly known as:  LIPITOR  Take 80 mg by mouth daily.     carvedilol 12.5 MG tablet  Commonly known as:  COREG  Take 12.5 mg by mouth 2 (two) times daily with a meal.     cetirizine 10 MG tablet  Commonly known as:  ZYRTEC  Take 10 mg by mouth daily.     chlorhexidine 0.12 % solution  Commonly known as:  PERIDEX  Use as directed 15 mLs in the mouth or throat 2 (two) times daily as needed (for dental plaque).     clopidogrel 75 MG tablet  Commonly known as:  PLAVIX  Take 75 mg by mouth daily.     colchicine 0.6 MG tablet  Take 0.6 mg by mouth as needed.     dextrose 40 % Gel  Commonly known as:  GLUTOSE  Take 1 Tube by mouth once as needed for low blood sugar.     fluticasone 50 MCG/ACT nasal spray  Commonly known as:  FLONASE  Place 2 sprays into both nostrils daily as needed for allergies or rhinitis.     insulin glargine 100 UNIT/ML injection  Commonly known as:  LANTUS  Inject 25 Units into the skin at bedtime.     isosorbide mononitrate 60 MG 24 hr tablet  Commonly known as:  IMDUR  Take 0.5 tablets (30 mg total) by mouth daily.     losartan 25 MG tablet  Commonly known as:  COZAAR  Take 1  tablet (25 mg total) by mouth daily.     nitroGLYCERIN 0.4 MG SL tablet  Commonly known as:  NITROSTAT  Place 1 tablet (0.4 mg total) under the tongue every 5 (five) minutes as needed for chest pain.     potassium chloride SA 20 MEQ tablet  Commonly known as:  K-DUR,KLOR-CON  Take 1 tablet (20 mEq total) by mouth 2 (two) times daily.     sodium fluoride 1.1 (0.5 F) MG/ML Soln  Commonly known as:  LURIDE  Take 1 drop by mouth daily as needed.     spironolactone 25 MG tablet  Commonly known as:  ALDACTONE  Take 25 mg by mouth daily.     THERA-GESIC EX  Apply 1 Dose topically 2 (two) times daily.     THROAT LOZENGES MT  Use as directed 1 Dose in the mouth or throat every 4 (four) hours as needed (for sore throat).     torsemide 20 MG tablet  Commonly known as:  DEMADEX  Take 1 tablet (20 mg total) by mouth 2 (two) times daily.     traZODone 100 MG tablet  Commonly known as:  DESYREL  Take 100 mg by mouth at bedtime.     venlafaxine XR 150 MG 24 hr capsule  Commonly known as:  EFFEXOR-XR  Take 150 mg by mouth daily with breakfast.     Vitamin D (Cholecalciferol) 1000 UNITS Caps  Take 2,000 Units by mouth daily.       Outstanding Labs/Studies  BMET early next week.  Duration of Discharge  Encounter   Greater than 30 minutes including physician time.  Signed, Nicolasa Ducking NP 01/10/2015, 11:05 AM

## 2015-01-10 NOTE — Progress Notes (Signed)
Patient Name: Kyle Morton Date of Encounter: 01/10/2015    Principal Problem:   Acute on chronic systolic CHF (congestive heart failure), NYHA class 4 Active Problems:   Cardiomyopathy, ischemic   Essential hypertension   Hyperlipidemia   DM2 (diabetes mellitus, type 2)   CAD S/P percutaneous coronary angioplasty    SUBJECTIVE  Breathing much improved overall. He has not done much walking.  CURRENT MEDS . allopurinol  100 mg Oral Daily  . aspirin EC  81 mg Oral Daily  . atorvastatin  80 mg Oral Daily  . carvedilol  12.5 mg Oral BID WC  . clopidogrel  75 mg Oral Daily  . enoxaparin (LOVENOX) injection  40 mg Subcutaneous Q24H  . fluticasone  2 spray Each Nare Daily  . insulin aspart  0-5 Units Subcutaneous QHS  . insulin aspart  0-9 Units Subcutaneous TID WC  . insulin glargine  25 Units Subcutaneous QHS  . losartan  25 mg Oral Daily  . pantoprazole  40 mg Oral Daily  . potassium chloride  40 mEq Oral BID  . sodium fluoride  1 drop Oral Daily  . spironolactone  25 mg Oral Daily  . traZODone  100 mg Oral QHS  . venlafaxine XR  150 mg Oral Q breakfast  . Vitamin D (Ergocalciferol)  50,000 Units Oral Q7 days    OBJECTIVE  Filed Vitals:   01/09/15 1153 01/09/15 1954 01/10/15 0512 01/10/15 0614  BP: 125/92 114/93 125/86   Pulse: 64 65 56   Temp: 98 F (36.7 C) 98.2 F (36.8 C) 97.7 F (36.5 C)   TempSrc:  Oral Oral   Resp: 18 18    Height:      Weight:    171 lb 15.3 oz (78 kg)  SpO2:  100% 100%     Intake/Output Summary (Last 24 hours) at 01/10/15 0908 Last data filed at 01/10/15 0737  Gross per 24 hour  Intake    363 ml  Output   3600 ml  Net  -3237 ml   Filed Weights   01/08/15 0824 01/09/15 0822 01/10/15 0614  Weight: 154 lb 12.2 oz (70.2 kg) 154 lb 12.2 oz (70.2 kg) 171 lb 15.3 oz (78 kg)    PHYSICAL EXAM  General: Pleasant, NAD. Neuro: Alert and oriented X 3. Moves all extremities spontaneously. Psych: Normal affect. HEENT:  Normal  Neck:  Supple without bruits.  JVP ~ 10-12 cm. Lungs:  Resp regular and unlabored, CTA. Heart: RRR no s3, s4, or murmurs. Abdomen: Soft, non-tender, non-distended, BS + x 4.  Extremities: No clubbing, cyanosis or edema. DP/PT/Radials 2+ and equal bilaterally.  R radial cath site w/o bleeding/bruit/hematoma.  Accessory Clinical Findings  CBC  Recent Labs  01/07/15 1451  WBC 4.2  HGB 11.6*  HCT 35.9*  MCV 102.6*  PLT 137*   Basic Metabolic Panel  Recent Labs  01/09/15 0347 01/10/15 0458  NA 137 138  K 3.9 3.3*  CL 102 99*  CO2 27 30  GLUCOSE 151* 67  BUN 21* 19  CREATININE 1.33* 1.43*  CALCIUM 8.9 9.3   Hemoglobin A1C  Recent Labs  01/07/15 1451  HGBA1C 6.7*   TELE  Rsr, pac's, pvc's, couplets.  Radiology/Studies  Dg Chest 2 View  01/03/2015   CLINICAL DATA:  Pre cardiac catheterization  EXAM: CHEST  2 VIEW  COMPARISON:  None.  FINDINGS: Mild cardiac enlargement. No pleural effusion or edema. Both lungs are clear. The visualized skeletal structures are unremarkable.  IMPRESSION: 1. No acute cardiopulmonary abnormalities.   Electronically Signed   By: Signa Kell M.D.   On: 01/03/2015 07:55    ASSESSMENT AND PLAN  1. Acute on chronic systolic chf/ICM: EF 20-25% by recent echo. Cath yesterday revealed severe ostial Diag dzs with patent LAD, midDiag, LCX, OM2, and OM3 stents. EDP measured @ 27. CO 3.14, CI 1.75. He was admitted for diuresis - minus 8 L since admission.  Decrease Torsemide to 20 mg bid. Increase Spirinolactone to 25 mg daily.  Resume Losartan at a lower dose of 25 mg once daily.   Will plan on switching Losartan to [facility] upon follow up.  Will eventually require EP eval for consideration of ICD - depends on whether or not/timing of revascularization performed.  2. CAD: Cath 6/7 as above. Severe ostial diag dzs with 60% LAD dzs @ bifurcation. Plan flow wire guided PCI in the next few weeks. No chest pain. Cont asa, statin, bb,  plavix.  3. HTN: BP stable.  4. DM II: Glucose stable. A1c 6.7. Cont current regimen.  5. Hypokalemia: replace.   Ok to discharge home today. Needs BMP in 3-4 days.  Follow up in 1-2 weeks.    Signed, Lorine Bears MD, [facility]

## 2015-01-10 NOTE — Progress Notes (Signed)
Pt discharged to home after seen by cardiologist, given MOM, preferred to go home for BM rather than wait at the hospital, pt ambulated around nurses' station prior to leaving, VS WDL.  Left hospital with his sister

## 2015-01-10 NOTE — Discharge Instructions (Signed)

## 2015-01-13 ENCOUNTER — Ambulatory Visit: Payer: Non-veteran care

## 2015-01-14 ENCOUNTER — Telehealth: Payer: Self-pay | Admitting: Cardiovascular Disease

## 2015-01-14 ENCOUNTER — Other Ambulatory Visit
Admission: RE | Admit: 2015-01-14 | Discharge: 2015-01-14 | Disposition: A | Discharge status: Home / Self Care | Payer: Non-veteran care | Source: Ambulatory Visit | Attending: Nurse Practitioner | Admitting: Nurse Practitioner

## 2015-01-14 ENCOUNTER — Other Ambulatory Visit: Payer: Non-veteran care

## 2015-01-14 DIAGNOSIS — N183 Chronic kidney disease, stage 3 (moderate): Secondary | ICD-10-CM | POA: Diagnosis present

## 2015-01-14 LAB — BASIC METABOLIC PANEL
ANION GAP: 9 (ref 5–15)
BUN: 27 mg/dL — AB (ref 6–20)
CO2: 24 mmol/L (ref 22–32)
Calcium: 9.4 mg/dL (ref 8.9–10.3)
Chloride: 104 mmol/L (ref 101–111)
Creatinine, Ser: 1.43 mg/dL — ABNORMAL HIGH (ref 0.61–1.24)
GFR calc Af Amer: 57 mL/min — ABNORMAL LOW (ref >60)
GFR, EST NON AFRICAN AMERICAN: 50 mL/min — AB (ref >60)
Glucose, Bld: 141 mg/dL — ABNORMAL HIGH (ref 65–99)
POTASSIUM: 4.3 mmol/L (ref 3.5–5.1)
SODIUM: 137 mmol/L (ref 135–145)

## 2015-01-14 NOTE — Telephone Encounter (Signed)
Spoke w/ pt.  Advised him to follow his instructions from the hospital but he is worried about dehydration.  He would like a new "recipe" to follow for his torsemide based on his wt.  Reports that he weighs 148 lbs today and feels that 40 mg each day is too much.

## 2015-01-14 NOTE — Telephone Encounter (Signed)
  For weight <146 pounds, would take torsemide 20 mg daily For weight 146 to 150, would take torsemide 20 mg po BID (8 am and 2 pm) For weight >150, increase torsemide up to 40 mg po BID  Continue spironolactone Potassium 10 meq with every torsemide 20 mg pill

## 2015-01-14 NOTE — Telephone Encounter (Signed)
Pt c/o medication issue:  1. Name of Medication: Torsemide 20 mg twice daily   2. How are you currently taking this medication (dosage and times per day)? See above   Patient said he was given different instructions by Dr. Mariah Milling prior to new  rx from hospital dc   3. Are you having a reaction (difficulty breathing--STAT)? No just wants to make sure he is taking dose Dr. Mariah Milling wants him to take   4. What is your medication issue? Patient dc last Friday from [facility] for heart Failure

## 2015-01-15 ENCOUNTER — Ambulatory Visit: Payer: Non-veteran care

## 2015-01-15 NOTE — Telephone Encounter (Signed)
Spoke w/ pt.  Advised him of Dr. Gollan's recommendation.  He verbalizes understanding and will call back w/ any questions or concerns.  

## 2015-01-17 ENCOUNTER — Ambulatory Visit: Payer: Non-veteran care

## 2015-01-20 ENCOUNTER — Encounter: Payer: Self-pay | Admitting: *Deleted

## 2015-01-20 ENCOUNTER — Ambulatory Visit: Payer: Non-veteran care

## 2015-01-21 ENCOUNTER — Encounter: Payer: Self-pay | Admitting: *Deleted

## 2015-01-21 DIAGNOSIS — I255 Ischemic cardiomyopathy: Secondary | ICD-10-CM

## 2015-01-21 NOTE — Progress Notes (Signed)
Cardiac Individual Treatment Plan  Patient Details  Name: Kyle Morton MRN: [id number] Date of Birth: [birth year] Referring Provider:  [facility] Physician Initial Encounter Date:  05/10/2014  Visit Diagnosis: Ischemic cardiomyopathy  Patient's Home Medications on Admission:  Current outpatient prescriptions:  .  allopurinol (ZYLOPRIM) 100 MG tablet, Take 200 mg by mouth daily. , Disp: , Rfl:  .  aspirin EC 81 MG tablet, Take 81 mg by mouth daily., Disp: , Rfl:  .  atorvastatin (LIPITOR) 80 MG tablet, Take 80 mg by mouth daily., Disp: , Rfl:  .  carvedilol (COREG) 12.5 MG tablet, Take 12.5 mg by mouth 2 (two) times daily with a meal., Disp: , Rfl:  .  cetirizine (ZYRTEC) 10 MG tablet, Take 10 mg by mouth daily., Disp: , Rfl:  .  chlorhexidine (PERIDEX) 0.12 % solution, Use as directed 15 mLs in the mouth or throat 2 (two) times daily as needed (for dental plaque). , Disp: , Rfl:  .  clopidogrel (PLAVIX) 75 MG tablet, Take 75 mg by mouth daily., Disp: , Rfl:  .  colchicine 0.6 MG tablet, Take 0.6 mg by mouth as needed. , Disp: , Rfl:  .  dextrose (GLUTOSE) 40 % GEL, Take 1 Tube by mouth once as needed for low blood sugar., Disp: , Rfl:  .  fluticasone (FLONASE) 50 MCG/ACT nasal spray, Place 2 sprays into both nostrils daily as needed for allergies or rhinitis. , Disp: , Rfl:  .  Hexylresorcinol (THROAT LOZENGES MT), Use as directed 1 Dose in the mouth or throat every 4 (four) hours as needed (for sore throat)., Disp: , Rfl:  .  insulin glargine (LANTUS) 100 UNIT/ML injection, Inject 25 Units into the skin at bedtime., Disp: , Rfl:  .  isosorbide mononitrate (IMDUR) 60 MG 24 hr tablet, Take 0.5 tablets (30 mg total) by mouth daily., Disp: , Rfl:  .  losartan (COZAAR) 25 MG tablet, Take 1 tablet (25 mg total) by mouth daily., Disp: 30 tablet, Rfl: 6 .  Menthol-Methyl Salicylate (THERA-GESIC EX), Apply 1 Dose topically 2 (two) times daily. , Disp: , Rfl:  .  nitroGLYCERIN (NITROSTAT) 0.4 MG SL tablet,  Place 1 tablet (0.4 mg total) under the tongue every 5 (five) minutes as needed for chest pain., Disp: 25 tablet, Rfl: 3 .  potassium chloride SA (K-DUR,KLOR-CON) 20 MEQ tablet, Take 1 tablet (20 mEq total) by mouth 2 (two) times daily., Disp: 60 tablet, Rfl: 6 .  sodium fluoride (LURIDE) 1.1 (0.5 F) MG/ML SOLN, Take 1 drop by mouth daily as needed., Disp: , Rfl:  .  spironolactone (ALDACTONE) 25 MG tablet, Take 25 mg by mouth daily. , Disp: , Rfl:  .  torsemide (DEMADEX) 20 MG tablet, Take 1 tablet (20 mg total) by mouth 2 (two) times daily., Disp: 180 tablet, Rfl: 3 .  traZODone (DESYREL) 100 MG tablet, Take 100 mg by mouth at bedtime., Disp: , Rfl:  .  venlafaxine XR (EFFEXOR-XR) 150 MG 24 hr capsule, Take 150 mg by mouth daily with breakfast., Disp: , Rfl:  .  Vitamin D, Cholecalciferol, 1000 UNITS CAPS, Take 2,000 Units by mouth daily., Disp: , Rfl:   Past Medical History: Past Medical History  Diagnosis Date  . Pleural effusion   . Chronic combined systolic and diastolic CHF (congestive heart failure)     a. reported baseline EF 30-35%; b. echo 12/2014: EF 20-25%, diffuse HK, restrictive filling pattern, severely dilated LA, mod MR/TR/PR, PASP 70 mm Hg, IVC dilted c/w elevated  CVP  . Coronary artery disease     a. xience stent to D1 in 2007; 2.5 x 15 mm stent placed to OM3 in 2013, w/ 3 stents placed in 2015 (details not available);  b. 01/2015 Cath: LM nl, LAD 60p, 97m ISR, D1 95ost, 20 ISR, RI min irregs, LCX 20 ISR, OM1 small, OM2 30 ISR, OM3 40 ISR, RCA 100p, EF 15%, 2+ MR, elev R heart pressures.  . Benign essential HTN   . Hyperlipidemia   . DM2 (diabetes mellitus, type 2)   . PVC's (premature ventricular contractions)   . Vitamin D deficiency   . GERD (gastroesophageal reflux disease)   . Carotid artery stenosis     a. carotid dopplers 12/2014: less than 39% ICA bilaterally, f/u 1 year  . Depression   . Gout     feet     Tobacco Use: History  Smoking status  . Never Smoker    Smokeless tobacco  . Never Used    Labs: Recent Review Flowsheet Data    Labs for ITP Cardiac and Pulmonary Rehab Latest Ref Rng 01/07/2015   Hemoglobin A1c 4.0 - 6.0 % 6.7(H)       Exercise Target Goals:    Exercise Program Goal: Individual exercise prescription set with THRR, safety & activity barriers. Participant demonstrates ability to understand and report RPE using BORG scale, to self-measure pulse accurately, and to acknowledge the importance of the exercise prescription.  Exercise Prescription Goal: Starting with aerobic activity 30 plus minutes a day, 3 days per week for initial exercise prescription. Provide home exercise prescription and guidelines that participant acknowledges understanding prior to discharge.  Activity Barriers & Risk Stratification:     Activity Barriers & Risk Stratification - 12/22/14 1301    Activity Barriers & Risk Stratification   Activity Barriers History of Falls;Assistive Device;Other (comment)   Comments Uses cane   Risk Stratification High      6 Minute Walk:   Initial Exercise Prescription:   Exercise Prescription Changes:     Exercise Prescription Changes      12/24/14 0700 01/20/15 1600         Exercise Review   Progression No  Absent since last review No  Absent since last review         Discharge Exercise Prescription (Final Exercise Prescription Changes):     Exercise Prescription Changes - 01/20/15 1600    Exercise Review   Progression No  Absent since last review      Nutrition:  Target Goals: Understanding of nutrition guidelines, daily intake of sodium 1500mg , cholesterol 200mg , calories 30% from fat and 7% or less from saturated fats, daily to have 5 or more servings of fruits and vegetables.  Biometrics:    Nutrition Therapy Plan and Nutrition Goals:   Nutrition Discharge: Rate Your Plate Scores:   Nutrition Goals Re-Evaluation:   Psychosocial: Target Goals: Acknowledge presence or  absence of depression, maximize coping skills, provide positive support system. Participant is able to verbalize types and ability to use techniques and skills needed for reducing stress and depression.  Initial Review & Psychosocial Screening:   Quality of Life Scores:   PHQ-9:     Recent Review Flowsheet Data    There is no flowsheet data to display.      Psychosocial Evaluation and Intervention:   Psychosocial Re-Evaluation:   Vocational Rehabilitation: Provide vocational rehab assistance to qualifying candidates.   Vocational Rehab Evaluation & Intervention:     Vocational Rehab -  12/22/14 1303    Initial Vocational Rehab Evaluation & Intervention   Assessment shows need for Vocational Rehabilitation No      Education: Education Goals: Education classes will be provided on a weekly basis, covering required topics. Participant will state understanding/return demonstration of topics presented.  Learning Barriers/Preferences:     Learning Barriers/Preferences - 12/22/14 1302    Learning Barriers/Preferences   Learning Barriers Exercise Concerns   Learning Preferences Group Instruction;Video;Written Material      Education Topics: General Nutrition Guidelines/Fats and Fiber: -Group instruction provided by verbal, written material, models and posters to present the general guidelines for heart healthy nutrition. Gives an explanation and review of dietary fats and fiber.   Controlling Sodium/Reading Food Labels: -Group verbal and written material supporting the discussion of sodium use in heart healthy nutrition. Review and explanation with models, verbal and written materials for utilization of the food label.   Exercise Physiology & Risk Factors: - Group verbal and written instruction with models to review the exercise physiology of the cardiovascular system and associated critical values. Details cardiovascular disease risk factors and the goals associated  with each risk factor.   Aerobic Exercise & Resistance Training: - Gives group verbal and written discussion on the health impact of inactivity. On the components of aerobic and resistive training programs and the benefits of this training and how to safely progress through these programs.   Flexibility, Balance, General Exercise Guidelines: - Provides group verbal and written instruction on the benefits of flexibility and balance training programs. Provides general exercise guidelines with specific guidelines to those with heart or lung disease. Demonstration and skill practice provided.   Stress Management: - Provides group verbal and written instruction about the health risks of elevated stress, cause of high stress, and healthy ways to reduce stress.   Depression: - Provides group verbal and written instruction on the correlation between heart/lung disease and depressed mood, treatment options, and the stigmas associated with seeking treatment.   Anatomy & Physiology of the Heart: - Group verbal and written instruction and models provide basic cardiac anatomy and physiology, with the coronary electrical and arterial systems. Review of: AMI, Angina, Valve disease, Heart Failure, Cardiac Arrhythmia, Pacemakers, and the ICD.   Cardiac Procedures: - Group verbal and written instruction and models to describe the testing methods done to diagnose heart disease. Reviews the outcomes of the test results. Describes the treatment choices: Medical Management, Angioplasty, or Coronary Bypass Surgery.   Cardiac Medications: - Group verbal and written instruction to review commonly prescribed medications for heart disease. Reviews the medication, class of the drug, and side effects. Includes the steps to properly store meds and maintain the prescription regimen.   Go Sex-Intimacy & Heart Disease, Get SMART - Goal Setting: - Group verbal and written instruction through game format to discuss  heart disease and the return to sexual intimacy. Provides group verbal and written material to discuss and apply goal setting through the application of the S.M.A.R.T. Method.   Other Matters of the Heart: - Provides group verbal, written materials and models to describe Heart Failure, Angina, Valve Disease, and Diabetes in the realm of heart disease. Includes description of the disease process and treatment options available to the cardiac patient.   Exercise & Equipment Safety: - Individual verbal instruction and demonstration of equipment use and safety with use of the equipment.   Infection Prevention: - Provides verbal and written material to individual with discussion of infection control including proper hand washing and  proper equipment cleaning during exercise session.   Falls Prevention: - Provides verbal and written material to individual with discussion of falls prevention and safety.   Diabetes: - Individual verbal and written instruction to review signs/symptoms of diabetes, desired ranges of glucose level fasting, after meals and with exercise. Advice that pre and post exercise glucose checks will be done for 3 sessions at entry of program.    Knowledge Questionnaire Score:   Personal Goals and Risk Factors at Admission:   Personal Goals and Risk Factors Review:    Personal Goals Discharge:     Comments: 30 day review   Barry Dienes has been out for medical concerns. Has approval of MD to return, waiting for visit authorization from [facility].

## 2015-01-22 ENCOUNTER — Ambulatory Visit: Payer: Non-veteran care | Admitting: *Deleted

## 2015-01-22 ENCOUNTER — Other Ambulatory Visit: Payer: Self-pay | Admitting: *Deleted

## 2015-01-22 DIAGNOSIS — I255 Ischemic cardiomyopathy: Secondary | ICD-10-CM

## 2015-01-23 ENCOUNTER — Ambulatory Visit: Payer: Non-veteran care | Admitting: Cardiovascular Disease

## 2015-01-24 ENCOUNTER — Ambulatory Visit: Payer: Non-veteran care

## 2015-01-27 ENCOUNTER — Ambulatory Visit: Payer: Non-veteran care

## 2015-01-28 ENCOUNTER — Emergency Department: Payer: Non-veteran care

## 2015-01-28 ENCOUNTER — Encounter: Payer: Self-pay | Admitting: Emergency Medicine

## 2015-01-28 ENCOUNTER — Observation Stay
Admission: EM | Admit: 2015-01-28 | Discharge: 2015-01-29 | Disposition: A | Discharge status: Home / Self Care | Payer: Non-veteran care | Attending: Internal Medicine | Admitting: Internal Medicine

## 2015-01-28 DIAGNOSIS — R55 Syncope and collapse: Secondary | ICD-10-CM | POA: Insufficient documentation

## 2015-01-28 DIAGNOSIS — M109 Gout, unspecified: Secondary | ICD-10-CM | POA: Diagnosis not present

## 2015-01-28 DIAGNOSIS — I809 Phlebitis and thrombophlebitis of unspecified site: Secondary | ICD-10-CM | POA: Insufficient documentation

## 2015-01-28 DIAGNOSIS — I25119 Atherosclerotic heart disease of native coronary artery with unspecified angina pectoris: Secondary | ICD-10-CM | POA: Insufficient documentation

## 2015-01-28 DIAGNOSIS — I255 Ischemic cardiomyopathy: Secondary | ICD-10-CM | POA: Insufficient documentation

## 2015-01-28 DIAGNOSIS — E119 Type 2 diabetes mellitus without complications: Secondary | ICD-10-CM | POA: Insufficient documentation

## 2015-01-28 DIAGNOSIS — I6529 Occlusion and stenosis of unspecified carotid artery: Secondary | ICD-10-CM | POA: Diagnosis not present

## 2015-01-28 DIAGNOSIS — Z79899 Other long term (current) drug therapy: Secondary | ICD-10-CM | POA: Insufficient documentation

## 2015-01-28 DIAGNOSIS — E860 Dehydration: Secondary | ICD-10-CM | POA: Diagnosis not present

## 2015-01-28 DIAGNOSIS — R5383 Other fatigue: Secondary | ICD-10-CM | POA: Diagnosis not present

## 2015-01-28 DIAGNOSIS — I129 Hypertensive chronic kidney disease with stage 1 through stage 4 chronic kidney disease, or unspecified chronic kidney disease: Secondary | ICD-10-CM | POA: Diagnosis not present

## 2015-01-28 DIAGNOSIS — M79602 Pain in left arm: Secondary | ICD-10-CM | POA: Diagnosis not present

## 2015-01-28 DIAGNOSIS — J90 Pleural effusion, not elsewhere classified: Secondary | ICD-10-CM | POA: Insufficient documentation

## 2015-01-28 DIAGNOSIS — I447 Left bundle-branch block, unspecified: Secondary | ICD-10-CM | POA: Insufficient documentation

## 2015-01-28 DIAGNOSIS — E876 Hypokalemia: Secondary | ICD-10-CM | POA: Diagnosis not present

## 2015-01-28 DIAGNOSIS — M7989 Other specified soft tissue disorders: Secondary | ICD-10-CM | POA: Insufficient documentation

## 2015-01-28 DIAGNOSIS — R0602 Shortness of breath: Secondary | ICD-10-CM

## 2015-01-28 DIAGNOSIS — I700 Atherosclerosis of aorta: Secondary | ICD-10-CM | POA: Diagnosis not present

## 2015-01-28 DIAGNOSIS — N183 Chronic kidney disease, stage 3 (moderate): Secondary | ICD-10-CM | POA: Insufficient documentation

## 2015-01-28 DIAGNOSIS — R531 Weakness: Secondary | ICD-10-CM | POA: Insufficient documentation

## 2015-01-28 DIAGNOSIS — Z7982 Long term (current) use of aspirin: Secondary | ICD-10-CM | POA: Insufficient documentation

## 2015-01-28 DIAGNOSIS — R9431 Abnormal electrocardiogram [ECG] [EKG]: Secondary | ICD-10-CM | POA: Insufficient documentation

## 2015-01-28 DIAGNOSIS — Z7951 Long term (current) use of inhaled steroids: Secondary | ICD-10-CM | POA: Insufficient documentation

## 2015-01-28 DIAGNOSIS — Z7902 Long term (current) use of antithrombotics/antiplatelets: Secondary | ICD-10-CM | POA: Diagnosis not present

## 2015-01-28 DIAGNOSIS — R9402 Abnormal brain scan: Secondary | ICD-10-CM | POA: Diagnosis not present

## 2015-01-28 DIAGNOSIS — I5042 Chronic combined systolic (congestive) and diastolic (congestive) heart failure: Secondary | ICD-10-CM | POA: Insufficient documentation

## 2015-01-28 DIAGNOSIS — E785 Hyperlipidemia, unspecified: Secondary | ICD-10-CM | POA: Insufficient documentation

## 2015-01-28 DIAGNOSIS — Z794 Long term (current) use of insulin: Secondary | ICD-10-CM | POA: Diagnosis not present

## 2015-01-28 DIAGNOSIS — I2510 Atherosclerotic heart disease of native coronary artery without angina pectoris: Secondary | ICD-10-CM | POA: Insufficient documentation

## 2015-01-28 DIAGNOSIS — I959 Hypotension, unspecified: Secondary | ICD-10-CM | POA: Insufficient documentation

## 2015-01-28 DIAGNOSIS — I493 Ventricular premature depolarization: Secondary | ICD-10-CM | POA: Insufficient documentation

## 2015-01-28 DIAGNOSIS — R42 Dizziness and giddiness: Secondary | ICD-10-CM

## 2015-01-28 DIAGNOSIS — F329 Major depressive disorder, single episode, unspecified: Secondary | ICD-10-CM | POA: Diagnosis not present

## 2015-01-28 DIAGNOSIS — K219 Gastro-esophageal reflux disease without esophagitis: Secondary | ICD-10-CM | POA: Insufficient documentation

## 2015-01-28 DIAGNOSIS — I214 Non-ST elevation (NSTEMI) myocardial infarction: Secondary | ICD-10-CM

## 2015-01-28 DIAGNOSIS — R609 Edema, unspecified: Secondary | ICD-10-CM | POA: Diagnosis present

## 2015-01-28 DIAGNOSIS — N179 Acute kidney failure, unspecified: Secondary | ICD-10-CM | POA: Diagnosis not present

## 2015-01-28 DIAGNOSIS — I2582 Chronic total occlusion of coronary artery: Secondary | ICD-10-CM | POA: Diagnosis not present

## 2015-01-28 DIAGNOSIS — E559 Vitamin D deficiency, unspecified: Secondary | ICD-10-CM | POA: Diagnosis not present

## 2015-01-28 HISTORY — DX: Chronic kidney disease, stage 3 (moderate): N18.3

## 2015-01-28 HISTORY — DX: Chronic kidney disease, stage 3 unspecified: N18.30

## 2015-01-28 LAB — CBC
HCT: 37 % — ABNORMAL LOW (ref 40.0–52.0)
Hemoglobin: 12.5 g/dL — ABNORMAL LOW (ref 13.0–18.0)
MCH: 33.7 pg (ref 26.0–34.0)
MCHC: 33.8 g/dL (ref 32.0–36.0)
MCV: 99.6 fL (ref 80.0–100.0)
PLATELETS: 225 10*3/uL (ref 150–440)
RBC: 3.72 MIL/uL — ABNORMAL LOW (ref 4.40–5.90)
RDW: 13.4 % (ref 11.5–14.5)
WBC: 5.9 10*3/uL (ref 3.8–10.6)

## 2015-01-28 LAB — URINALYSIS COMPLETE WITH MICROSCOPIC (ARMC ONLY)
BILIRUBIN URINE: NEGATIVE
Bacteria, UA: NONE SEEN
Glucose, UA: 50 mg/dL — AB
Hgb urine dipstick: NEGATIVE
Ketones, ur: NEGATIVE mg/dL
Leukocytes, UA: NEGATIVE
Nitrite: NEGATIVE
PROTEIN: NEGATIVE mg/dL
SPECIFIC GRAVITY, URINE: 1.014 (ref 1.005–1.030)
pH: 5 (ref 5.0–8.0)

## 2015-01-28 LAB — HEPATIC FUNCTION PANEL
ALT: 20 U/L (ref 17–63)
AST: 26 U/L (ref 15–41)
Albumin: 3.4 g/dL — ABNORMAL LOW (ref 3.5–5.0)
Alkaline Phosphatase: 167 U/L — ABNORMAL HIGH (ref 38–126)
Bilirubin, Direct: 0.6 mg/dL — ABNORMAL HIGH (ref 0.1–0.5)
Indirect Bilirubin: 1.2 mg/dL — ABNORMAL HIGH (ref 0.3–0.9)
TOTAL PROTEIN: 7.9 g/dL (ref 6.5–8.1)
Total Bilirubin: 1.8 mg/dL — ABNORMAL HIGH (ref 0.3–1.2)

## 2015-01-28 LAB — BASIC METABOLIC PANEL
ANION GAP: 11 (ref 5–15)
BUN: 31 mg/dL — ABNORMAL HIGH (ref 6–20)
CO2: 27 mmol/L (ref 22–32)
Calcium: 8.8 mg/dL — ABNORMAL LOW (ref 8.9–10.3)
Chloride: 96 mmol/L — ABNORMAL LOW (ref 101–111)
Creatinine, Ser: 1.93 mg/dL — ABNORMAL HIGH (ref 0.61–1.24)
GFR calc Af Amer: 40 mL/min — ABNORMAL LOW (ref >60)
GFR, EST NON AFRICAN AMERICAN: 35 mL/min — AB (ref >60)
Glucose, Bld: 349 mg/dL — ABNORMAL HIGH (ref 65–99)
Potassium: 3.2 mmol/L — ABNORMAL LOW (ref 3.5–5.1)
SODIUM: 134 mmol/L — AB (ref 135–145)

## 2015-01-28 LAB — TROPONIN I
TROPONIN I: 0.05 ng/mL — AB (ref <0.031)
Troponin I: 0.04 ng/mL — ABNORMAL HIGH (ref <0.031)

## 2015-01-28 LAB — GLUCOSE, CAPILLARY
Glucose-Capillary: 277 mg/dL — ABNORMAL HIGH (ref 65–99)
Glucose-Capillary: 312 mg/dL — ABNORMAL HIGH (ref 65–99)

## 2015-01-28 MED ORDER — ENOXAPARIN SODIUM 40 MG/0.4ML ~~LOC~~ SOLN: 40.0000 mg | SUBCUTANEOUS | Status: DC

## 2015-01-28 MED ORDER — ACETAMINOPHEN 325 MG PO TABS: 650.0000 mg | ORAL_TABLET | Freq: Four times a day (QID) | ORAL | Status: DC | PRN

## 2015-01-28 MED ORDER — SODIUM CHLORIDE 0.9 % IV BOLUS (SEPSIS)
250.0000 mL | Freq: Once | INTRAVENOUS | Status: AC
  Administered 2015-01-28: 250 mL via INTRAVENOUS

## 2015-01-28 MED ORDER — FLUTICASONE PROPIONATE 50 MCG/ACT NA SUSP: 2.0000 | Freq: Every day | NASAL | Status: DC | PRN

## 2015-01-28 MED ORDER — ONDANSETRON HCL 4 MG/2ML IJ SOLN: 4.0000 mg | Freq: Four times a day (QID) | INTRAMUSCULAR | Status: DC | PRN

## 2015-01-28 MED ORDER — LORATADINE 10 MG PO TABS
10.0000 mg | ORAL_TABLET | Freq: Every day | ORAL | Status: DC
  Administered 2015-01-29: 10 mg via ORAL
  Filled 2015-01-28: qty 1

## 2015-01-28 MED ORDER — SODIUM CHLORIDE 0.9 % IJ SOLN: 3.0000 mL | Freq: Two times a day (BID) | INTRAMUSCULAR | Status: DC

## 2015-01-28 MED ORDER — ATORVASTATIN CALCIUM 20 MG PO TABS
80.0000 mg | ORAL_TABLET | Freq: Every day | ORAL | Status: DC
  Administered 2015-01-29: 80 mg via ORAL
  Filled 2015-01-28: qty 4

## 2015-01-28 MED ORDER — ACETAMINOPHEN 650 MG RE SUPP: 650.0000 mg | Freq: Four times a day (QID) | RECTAL | Status: DC | PRN

## 2015-01-28 MED ORDER — GLUCOSE 40 % PO GEL: 1.0000 | Freq: Once | ORAL | Status: AC | PRN

## 2015-01-28 MED ORDER — VENLAFAXINE HCL ER 75 MG PO CP24
150.0000 mg | ORAL_CAPSULE | Freq: Every day | ORAL | Status: DC
  Administered 2015-01-29: 150 mg via ORAL
  Filled 2015-01-28 (×2): qty 2

## 2015-01-28 MED ORDER — TRAZODONE HCL 100 MG PO TABS
100.0000 mg | ORAL_TABLET | Freq: Every day | ORAL | Status: DC
  Administered 2015-01-29: 100 mg via ORAL
  Filled 2015-01-28: qty 1

## 2015-01-28 MED ORDER — SODIUM CHLORIDE 0.9 % IV SOLN
INTRAVENOUS | Status: AC
  Administered 2015-01-29: via INTRAVENOUS

## 2015-01-28 MED ORDER — CLOPIDOGREL BISULFATE 75 MG PO TABS
75.0000 mg | ORAL_TABLET | Freq: Every day | ORAL | Status: DC
  Administered 2015-01-29: 75 mg via ORAL
  Filled 2015-01-28: qty 1

## 2015-01-28 MED ORDER — INSULIN ASPART 100 UNIT/ML ~~LOC~~ SOLN
0.0000 [IU] | Freq: Three times a day (TID) | SUBCUTANEOUS | Status: DC
  Administered 2015-01-29: 2 [IU] via SUBCUTANEOUS
  Administered 2015-01-29: 3 [IU] via SUBCUTANEOUS
  Filled 2015-01-28: qty 3
  Filled 2015-01-28: qty 2

## 2015-01-28 MED ORDER — CARVEDILOL 12.5 MG PO TABS
12.5000 mg | ORAL_TABLET | Freq: Two times a day (BID) | ORAL | Status: DC
  Administered 2015-01-29: 12.5 mg via ORAL
  Filled 2015-01-28 (×2): qty 1

## 2015-01-28 MED ORDER — POTASSIUM CHLORIDE CRYS ER 20 MEQ PO TBCR
20.0000 meq | EXTENDED_RELEASE_TABLET | Freq: Two times a day (BID) | ORAL | Status: DC
  Administered 2015-01-29 (×2): 20 meq via ORAL
  Filled 2015-01-28 (×2): qty 1

## 2015-01-28 MED ORDER — INSULIN ASPART 100 UNIT/ML ~~LOC~~ SOLN
0.0000 [IU] | Freq: Every day | SUBCUTANEOUS | Status: DC
  Administered 2015-01-29: 4 [IU] via SUBCUTANEOUS
  Filled 2015-01-28: qty 4

## 2015-01-28 MED ORDER — ASPIRIN EC 81 MG PO TBEC
81.0000 mg | DELAYED_RELEASE_TABLET | Freq: Every day | ORAL | Status: DC
  Administered 2015-01-29: 81 mg via ORAL
  Filled 2015-01-28: qty 1

## 2015-01-28 MED ORDER — ISOSORBIDE MONONITRATE ER 30 MG PO TB24
30.0000 mg | ORAL_TABLET | Freq: Every day | ORAL | Status: DC
  Administered 2015-01-29: 30 mg via ORAL
  Filled 2015-01-28 (×2): qty 1

## 2015-01-28 MED ORDER — ALLOPURINOL 100 MG PO TABS
200.0000 mg | ORAL_TABLET | Freq: Every day | ORAL | Status: DC
  Administered 2015-01-29: 200 mg via ORAL
  Filled 2015-01-28 (×2): qty 2

## 2015-01-28 MED ORDER — SODIUM CHLORIDE 0.9 % IJ SOLN: 3.0000 mL | INTRAMUSCULAR | Status: DC | PRN

## 2015-01-28 MED ORDER — ASPIRIN EC 81 MG PO TBEC: 81.0000 mg | DELAYED_RELEASE_TABLET | Freq: Every day | ORAL | Status: DC

## 2015-01-28 MED ORDER — ONDANSETRON HCL 4 MG PO TABS: 4.0000 mg | ORAL_TABLET | Freq: Four times a day (QID) | ORAL | Status: DC | PRN

## 2015-01-28 MED ORDER — LOSARTAN POTASSIUM 25 MG PO TABS
25.0000 mg | ORAL_TABLET | Freq: Every day | ORAL | Status: DC
  Filled 2015-01-28: qty 1

## 2015-01-28 MED ORDER — INSULIN ASPART 100 UNIT/ML ~~LOC~~ SOLN: 6.0000 [IU] | Freq: Once | SUBCUTANEOUS | Status: DC

## 2015-01-28 MED ORDER — NITROGLYCERIN 0.4 MG SL SUBL: 0.4000 mg | SUBLINGUAL_TABLET | SUBLINGUAL | Status: DC | PRN

## 2015-01-28 MED ORDER — INSULIN GLARGINE 100 UNIT/ML ~~LOC~~ SOLN
25.0000 [IU] | Freq: Every day | SUBCUTANEOUS | Status: DC
  Administered 2015-01-29: 25 [IU] via SUBCUTANEOUS
  Filled 2015-01-28 (×2): qty 0.25

## 2015-01-28 MED ORDER — SPIRONOLACTONE 25 MG PO TABS
25.0000 mg | ORAL_TABLET | Freq: Every day | ORAL | Status: DC
  Administered 2015-01-29: 25 mg via ORAL
  Filled 2015-01-28 (×2): qty 1

## 2015-01-28 MED ORDER — CHLORHEXIDINE GLUCONATE 0.12 % MT SOLN: 15.0000 mL | Freq: Two times a day (BID) | OROMUCOSAL | Status: DC | PRN

## 2015-01-28 NOTE — ED Notes (Addendum)
Was showering , sudden onset of weakness, dizziness, and then fell , no loc , no head trauma, and he took his own BP at home 78/55,  approx 3 weeks ago had a cardiac cath

## 2015-01-28 NOTE — ED Notes (Addendum)
Pt alert and in NAD at time of transport. All belongings returned to pt. Pt monitored.

## 2015-01-28 NOTE — H&P (Signed)
[facility] [place] [facility]   PATIENT NAME: Kyle Morton    MR#:  [id number]  DATE OF BIRTH:  02/10/1951  DATE OF ADMISSION:  01/28/2015  PRIMARY CARE PHYSICIAN: Toya SmothersEGEN, LANCE, PA   REQUESTING/REFERRING PHYSICIAN: Dr. Inocencio HomesGayle  CHIEF COMPLAINT:   Chief Complaint  Patient presents with  . Dizziness    HISTORY OF PRESENT ILLNESS:  Kyle MilroyWillie Leja  is a 64 y.o. male with a known history of chronic systolic CHF with ejection fraction 25%, hypertension, diabetes mellitus, CK D stage III, CAD presents to the emergency room complaining of an episode of lightheadedness in his shower. Patient checked his blood pressure at home and was found to be 78/55 mm hg. . In the emergency room patient's initial blood pressure was systolic of 101 presently after 250 mL of normal saline is improved to 146/101. 2 sets of troponin have been checked which are stable. His creatinine is worsened to 1.93 from baseline of 1.4 and is being admitted for acute renal failure. Observation admission. No chest pain. Chronic shortness of breath has improved. EKG shows nothing acute. Case was discussed with Dr. Herbie BaltimoreHarding by the ED physician Dr. Inocencio HomesGayle.  PAST MEDICAL HISTORY:   Past Medical History  Diagnosis Date  . Pleural effusion   . Chronic combined systolic and diastolic CHF (congestive heart failure)     a. reported baseline EF 30-35%; b. echo 12/2014: EF 20-25%, diffuse HK, restrictive filling pattern, severely dilated LA, mod MR/TR/PR, PASP 70 mm Hg, IVC dilted c/w elevated CVP  . Coronary artery disease     a. xience stent to D1 in 2007; 2.5 x 15 mm stent placed to OM3 in 2013, w/ 3 stents placed in 2015 (details not available);  b. 01/2015 Cath: LM nl, LAD 60p, 5932m ISR, D1 95ost, 20 ISR, RI min irregs, LCX 20 ISR, OM1 small, OM2 30 ISR, OM3 40 ISR, RCA 100p, EF 15%, 2+ MR, elev R heart pressures.  . Benign essential HTN   . Hyperlipidemia   . DM2 (diabetes mellitus, type 2)   . PVC's  (premature ventricular contractions)   . Vitamin D deficiency   . GERD (gastroesophageal reflux disease)   . Carotid artery stenosis     a. carotid dopplers 12/2014: less than 39% ICA bilaterally, f/u 1 year  . Depression   . Gout     feet     PAST SURGICAL HISTORY:   Past Surgical History  Procedure Laterality Date  . Cardiac catheterization  2013,2007  . Coronary angioplasty with stent placement  06/05/2012    OM3  . Coronary angioplasty with stent placement  04/25/2006    RAD  . Cardiac catheterization N/A 01/07/2015    Procedure: Right and Left Heart Cath;  Surgeon: Iran OuchMuhammad A Arida, MD;  Location: [facility] INVASIVE CV LAB;  Service: Cardiovascular;  Laterality: N/A;    SOCIAL HISTORY:   History  Substance Use Topics  . Smoking status: Never Smoker   . Smokeless tobacco: Never Used  . Alcohol Use: No    FAMILY HISTORY:   Family History  Problem Relation Age of Onset  . Hypertension Mother   . Hypertension Father   . Hypertension Brother     DRUG ALLERGIES:   Allergies  Allergen Reactions  . Lisinopril Cough    REVIEW OF SYSTEMS:   Review of Systems  Constitutional: Positive for weight loss and malaise/fatigue. Negative for fever and chills.  HENT: Negative for hearing loss and nosebleeds.  Eyes: Negative for blurred vision, double vision and pain.  Respiratory: Negative for cough, hemoptysis, sputum production, shortness of breath and wheezing.   Cardiovascular: Negative for chest pain, palpitations, orthopnea and leg swelling.  Gastrointestinal: Negative for nausea, vomiting, abdominal pain, diarrhea and constipation.  Genitourinary: Negative for dysuria and hematuria.  Musculoskeletal: Negative for myalgias, back pain and falls.  Skin: Negative for rash.  Neurological: Positive for weakness. Negative for dizziness, tremors, sensory change, speech change, focal weakness, seizures and headaches.  Endo/Heme/Allergies: Does not bruise/bleed easily.   Psychiatric/Behavioral: Negative for depression and memory loss. The patient is not nervous/anxious.     MEDICATIONS AT HOME:   Prior to Admission medications   Medication Sig Start Date End Date Taking? Authorizing Provider  allopurinol (ZYLOPRIM) 100 MG tablet Take 200 mg by mouth daily.     Historical Provider, MD  aspirin EC 81 MG tablet Take 81 mg by mouth daily.    Historical Provider, MD  atorvastatin (LIPITOR) 80 MG tablet Take 80 mg by mouth daily.    Historical Provider, MD  carvedilol (COREG) 12.5 MG tablet Take 12.5 mg by mouth 2 (two) times daily with a meal.    Historical Provider, MD  cetirizine (ZYRTEC) 10 MG tablet Take 10 mg by mouth daily.    Historical Provider, MD  chlorhexidine (PERIDEX) 0.12 % solution Use as directed 15 mLs in the mouth or throat 2 (two) times daily as needed (for dental plaque).     Historical Provider, MD  clopidogrel (PLAVIX) 75 MG tablet Take 75 mg by mouth daily.    Historical Provider, MD  colchicine 0.6 MG tablet Take 0.6 mg by mouth as needed.     Historical Provider, MD  dextrose (GLUTOSE) 40 % GEL Take 1 Tube by mouth once as needed for low blood sugar.    Historical Provider, MD  fluticasone (FLONASE) 50 MCG/ACT nasal spray Place 2 sprays into both nostrils daily as needed for allergies or rhinitis.     Historical Provider, MD  Hexylresorcinol (THROAT LOZENGES MT) Use as directed 1 Dose in the mouth or throat every 4 (four) hours as needed (for sore throat).    Historical Provider, MD  insulin glargine (LANTUS) 100 UNIT/ML injection Inject 25 Units into the skin at bedtime.    Historical Provider, MD  isosorbide mononitrate (IMDUR) 60 MG 24 hr tablet Take 0.5 tablets (30 mg total) by mouth daily. 01/10/15   Ok Anis, NP  losartan (COZAAR) 25 MG tablet Take 1 tablet (25 mg total) by mouth daily. 01/10/15   Ok Anis, NP  Menthol-Methyl Salicylate (THERA-GESIC EX) Apply 1 Dose topically 2 (two) times daily.     Historical  Provider, MD  nitroGLYCERIN (NITROSTAT) 0.4 MG SL tablet Place 1 tablet (0.4 mg total) under the tongue every 5 (five) minutes as needed for chest pain. 01/10/15   Ok Anis, NP  potassium chloride SA (K-DUR,KLOR-CON) 20 MEQ tablet Take 1 tablet (20 mEq total) by mouth 2 (two) times daily. 01/10/15   Ok Anis, NP  sodium fluoride (LURIDE) 1.1 (0.5 F) MG/ML SOLN Take 1 drop by mouth daily as needed.    Historical Provider, MD  spironolactone (ALDACTONE) 25 MG tablet Take 25 mg by mouth daily.     Historical Provider, MD  torsemide (DEMADEX) 20 MG tablet Take 1 tablet (20 mg total) by mouth 2 (two) times daily. 09/19/14   Antonieta Iba, MD  traZODone (DESYREL) 100 MG tablet Take 100 mg by  mouth at bedtime.    Historical Provider, MD  venlafaxine XR (EFFEXOR-XR) 150 MG 24 hr capsule Take 150 mg by mouth daily with breakfast.    Historical Provider, MD  Vitamin D, Cholecalciferol, 1000 UNITS CAPS Take 2,000 Units by mouth daily.    Historical Provider, MD      VITAL SIGNS:  Blood pressure 142/99, pulse 71, temperature 98.5 F (36.9 C), temperature source Oral, resp. rate 18, height  (1.676 m), weight 64.411 kg (142 lb), SpO2 100 %.  PHYSICAL EXAMINATION:  Physical Exam  GENERAL:  64 y.o.-year-old patient lying in the bed with no acute distress.  EYES: Pupils equal, round, reactive to light and accommodation. No scleral icterus. Extraocular muscles intact.  HEENT: Head atraumatic, normocephalic. Oropharynx and nasopharynx clear. No oropharyngeal erythema, moist oral mucosa  NECK:  Supple, no jugular venous distention. No thyroid enlargement, no tenderness.  LUNGS: Normal breath sounds bilaterally, no wheezing, rales, rhonchi. No use of accessory muscles of respiration.  CARDIOVASCULAR: S1, S2 normal. No murmurs, rubs, or gallops.  ABDOMEN: Soft, nontender, nondistended. Bowel sounds present. No organomegaly or mass.  EXTREMITIES: No pedal edema, cyanosis, or clubbing. +  2 pedal & radial pulses b/l.   NEUROLOGIC: Cranial nerves II through XII are intact. No focal Motor or sensory deficits appreciated b/l PSYCHIATRIC: The patient is alert and oriented x 3. Good affect.  SKIN: No obvious rash, lesion, or ulcer.   LABORATORY PANEL:   CBC  Recent Labs Lab 01/28/15 1737  WBC 5.9  HGB 12.5*  HCT 37.0*  PLT 225   ------------------------------------------------------------------------------------------------------------------  Chemistries   Recent Labs Lab 01/28/15 1737  NA 134*  K 3.2*  CL 96*  CO2 27  GLUCOSE 349*  BUN 31*  CREATININE 1.93*  CALCIUM 8.8*  AST 26  ALT 20  ALKPHOS 167*  BILITOT 1.8*   ------------------------------------------------------------------------------------------------------------------  Cardiac Enzymes  Recent Labs Lab 01/28/15 2056  TROPONINI 0.05*   ------------------------------------------------------------------------------------------------------------------  RADIOLOGY:  Ct Head Wo Contrast  01/28/2015   CLINICAL DATA:  Weakness.  Dizziness.  Fall.  EXAM: CT HEAD WITHOUT CONTRAST  TECHNIQUE: Contiguous axial images were obtained from the base of the skull through the vertex without intravenous contrast.  COMPARISON:  None.  FINDINGS: The brainstem, cerebellum, cerebral peduncles, thalamus, basal ganglia, basilar cisterns, and ventricular system appear within normal limits. Periventricular white matter and corona radiata hypodensities favor chronic ischemic microvascular white matter disease. No intracranial hemorrhage or acute CVA.  There are bilateral calcified extra-axial masses favoring meningioma is scattered adjacent to the cerebrum including along the falx.  The porous acoustic is appears somewhat wide bilaterally although I am not certain that there is necessarily a vestibular schwannoma.  No intracranial hemorrhage, mass lesion, or acute CVA.  There is atherosclerotic calcification of the cavernous  carotid arteries bilaterally.  IMPRESSION: 1. No acute intracranial findings are observed. 2. 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 Although not urgent, MRI of the brain with and without contrast would be suggested to assess for vestibular schwannoma or other supporting evidence. 3. Periventricular white matter and corona radiata hypodensities favor chronic ischemic microvascular white matter disease.   Electronically Signed   By: Gaylyn Rong M.D.   On: 01/28/2015 18:58   US Venous Img Upper Uni Left  01/28/2015   CLINICAL DATA:  Left forearm swelling. Pain. Recent cardiac catheterization utilizing upper extremity access.  EXAM: LEFT UPPER EXTREMITY VENOUS  DOPPLER ULTRASOUND  TECHNIQUE: Gray-scale sonography with graded compression, as well as color Doppler and duplex ultrasound were performed to evaluate the upper extremity deep venous system from the level of the subclavian vein and including the jugular, axillary, basilic, radial, ulnar and upper cephalic vein. Spectral Doppler was utilized to evaluate flow at rest and with distal augmentation maneuvers.  COMPARISON:  None.  FINDINGS: Contralateral Subclavian Vein: Respiratory phasicity is normal and symmetric with the symptomatic side. No evidence of thrombus. Normal compressibility.  Internal Jugular Vein: No evidence of thrombus. Normal compressibility, respiratory phasicity and response to augmentation.  Subclavian Vein: No evidence of thrombus. Normal compressibility, respiratory phasicity and response to augmentation.  Axillary Vein: No evidence of thrombus. Normal compressibility, respiratory phasicity and response to augmentation.  Cephalic Vein: No evidence of thrombus. Normal compressibility, respiratory phasicity and response to augmentation.  Basilic Vein: No evidence of thrombus. Normal compressibility,  respiratory phasicity and response to augmentation.  Brachial Veins: No evidence of thrombus. Normal compressibility, respiratory phasicity and response to augmentation.  Radial Veins: No evidence of thrombus. Normal compressibility, respiratory phasicity and response to augmentation.  Ulnar Veins: No evidence of thrombus. Normal compressibility, respiratory phasicity and response to augmentation.  Venous Reflux:  None visualized.  Other Findings: In the area swelling in the lateral forearm there is nonocclusive thrombus within a superficial vein, likely branch of the cephalic.  IMPRESSION: 1. No evidence of deep venous thrombosis. 2. Nonocclusive superficial thrombus in area of swelling in the forearm.   Electronically Signed   By: Rubye Oaks M.D.   On: 01/28/2015 19:48   Dg Chest Portable 1 View  01/28/2015   CLINICAL DATA:  64 year old male with a history of left forearm swelling.  EXAM: PORTABLE CHEST - 1 VIEW  COMPARISON:  01/02/2015  FINDINGS: Cardiomediastinal silhouette unchanged in size and contour. Atherosclerotic calcifications of the aortic arch.  No confluent airspace disease, pneumothorax, or pleural effusion.  No displaced fracture.  IMPRESSION: No radiographic evidence of acute cardiopulmonary disease.  Atherosclerosis.  Signed,  Yvone Neu. Loreta Ave, DO  [facility]  [facility]   Electronically Signed   By: Gilmer Mor D.O.   On: 01/28/2015 18:05     IMPRESSION AND PLAN:   64 year old African-American male patient with history of chronic systolic CHF with ejection fraction of 25%, CK D stage III, hypertension, diabetes, CAD presents to the emergency room with lightheadedness and hypotension at home.  * Acute renal failure over CK D stage III This is due to increased diuresis. We will hold torsemide start him on gentle IV hydration. Will need torsemide once a day instead of twice a day at discharge. Repeat BMP in the morning. Monitor  input and output and daily weight. We'll also request cardiology to see the patient in the hospital for diabetic recommendations.  * CAD Stable. Patient needs repeat catheterization as outpatient per her last cardiology note. Troponin has been checked twice in the emergency room and is stable. No chest pain. EKG shows no acute changes.  * Hypertension Patient was hypotensive at home after his episode of lightheadedness. This is likely from his dehydration. After 250 mL of normal saline boluses blood pressure is at 146/101. We will continue his home medications for blood pressure.  * Insulin-dependent diabetes mellitus Daniel Levemir and sliding scale insulin.  * Chronic systolic CHF No signs of fluid overload at this time. No edema, no crackles, no JVD. Patient looks dehydrated and is on IV fluids.  All the records are reviewed and case discussed with ED provider. Management plans discussed with the patient, family and they are in agreement.  CODE STATUS: FULL CODE  TOTAL TIME TAKING CARE OF THIS PATIENT: 40 minutes.    Milagros Loll R M.D on 01/28/2015 at 10:45 PM  Between 7am to 6pm - Pager - [phone]  After 6pm go to [email or web address] - password [email or web address]  [facility]  Office  [phone]  CC: Primary care physician; Toya Smothers, PA

## 2015-01-28 NOTE — ED Provider Notes (Signed)
[facility] Emergency Department Provider Note  ____________________________________________  Time seen: Approximately 5:40 PM  I have reviewed the triage vital signs and the nursing notes.   HISTORY  Chief Complaint Dizziness    HPI Kyle Morton is a 64 y.o. male with history of ischemic cardiomyopathy, CHF with EF of 20%, hypertension, hyperlipidemia, type 2 diabetes, status post right and left heart catheterization on 01/07/2015 with angioplasty who presents with 2 separate complaints. For one, he had an episode of lightheadedness with near-syncope today. Patient reports that he stood up, was walking around his home when he began feeling quite lightheaded. He sat on the edge of the bathtub and fell backwards into the tub but did not hit his head, did not lose consciousness. He checked his blood pressure at that time and it was 78/55. He has had dyspnea with exertion as well as lightheadedness for the past several weeks, intermittent, gradual onset. Currently he has no lightheadedness (severity 0/10). Additionally since his most recent discharge from the hospital in mid June he has had an area of focal swelling on the left forearm which is concerning to him for a blood clot. He denies any chest pain. No vomiting or diarrhea, no fevers or chills.   Past Medical History  Diagnosis Date  . Pleural effusion   . Chronic combined systolic and diastolic CHF (congestive heart failure)     a. reported baseline EF 30-35%; b. echo 12/2014: EF 20-25%, diffuse HK, restrictive filling pattern, severely dilated LA, mod MR/TR/PR, PASP 70 mm Hg, IVC dilted c/w elevated CVP  . Coronary artery disease     a. xience stent to D1 in 2007; 2.5 x 15 mm stent placed to OM3 in 2013, w/ 3 stents placed in 2015 (details not available);  b. 01/2015 Cath: LM nl, LAD 60p, 37m ISR, D1 95ost, 20 ISR, RI min irregs, LCX 20 ISR, OM1 small, OM2 30 ISR, OM3 40 ISR, RCA 100p, EF 15%, 2+ MR, elev R heart  pressures.  . Benign essential HTN   . Hyperlipidemia   . DM2 (diabetes mellitus, type 2)   . PVC's (premature ventricular contractions)   . Vitamin D deficiency   . GERD (gastroesophageal reflux disease)   . Carotid artery stenosis     a. carotid dopplers 12/2014: less than 39% ICA bilaterally, f/u 1 year  . Depression   . Gout     feet     Patient Active Problem List   Diagnosis Date Noted  . Acute on chronic systolic CHF (congestive heart failure), NYHA class 4 01/07/2015  . Chronic combined systolic and diastolic CHF (congestive heart failure)   . Essential hypertension   . Hyperlipidemia   . DM2 (diabetes mellitus, type 2)   . CAD S/P percutaneous coronary angioplasty   . GERD (gastroesophageal reflux disease)   . SOB (shortness of breath) 09/04/2014  . History of coronary artery stent placement 09/04/2014  . Cardiomyopathy, ischemic 09/04/2014  . Bronchitis 09/04/2014    Past Surgical History  Procedure Laterality Date  . Cardiac catheterization  2013,2007  . Coronary angioplasty with stent placement  06/05/2012    OM3  . Coronary angioplasty with stent placement  04/25/2006    RAD  . Cardiac catheterization N/A 01/07/2015    Procedure: Right and Left Heart Cath;  Surgeon: Iran Ouch, MD;  Location: [facility] INVASIVE CV LAB;  Service: Cardiovascular;  Laterality: N/A;    Current Outpatient Rx  Name  Route  Sig  Dispense  Refill  . allopurinol (ZYLOPRIM) 100 MG tablet   Oral   Take 200 mg by mouth daily.          Marland Kitchen. aspirin EC 81 MG tablet   Oral   Take 81 mg by mouth daily.         Marland Kitchen. atorvastatin (LIPITOR) 80 MG tablet   Oral   Take 80 mg by mouth daily.         . carvedilol (COREG) 12.5 MG tablet   Oral   Take 12.5 mg by mouth 2 (two) times daily with a meal.         . cetirizine (ZYRTEC) 10 MG tablet   Oral   Take 10 mg by mouth daily.         . chlorhexidine (PERIDEX) 0.12 % solution   Mouth/Throat   Use as directed 15 mLs in the mouth or  throat 2 (two) times daily as needed (for dental plaque).          . clopidogrel (PLAVIX) 75 MG tablet   Oral   Take 75 mg by mouth daily.         . colchicine 0.6 MG tablet   Oral   Take 0.6 mg by mouth as needed.          Marland Kitchen. dextrose (GLUTOSE) 40 % GEL   Oral   Take 1 Tube by mouth once as needed for low blood sugar.         . fluticasone (FLONASE) 50 MCG/ACT nasal spray   Each Nare   Place 2 sprays into both nostrils daily as needed for allergies or rhinitis.          Marland Kitchen. Hexylresorcinol (THROAT LOZENGES MT)   Mouth/Throat   Use as directed 1 Dose in the mouth or throat every 4 (four) hours as needed (for sore throat).         . insulin glargine (LANTUS) 100 UNIT/ML injection   Subcutaneous   Inject 25 Units into the skin at bedtime.         . isosorbide mononitrate (IMDUR) 60 MG 24 hr tablet   Oral   Take 0.5 tablets (30 mg total) by mouth daily.         Marland Kitchen. losartan (COZAAR) 25 MG tablet   Oral   Take 1 tablet (25 mg total) by mouth daily.   30 tablet   6   . Menthol-Methyl Salicylate (THERA-GESIC EX)   Apply externally   Apply 1 Dose topically 2 (two) times daily.          . nitroGLYCERIN (NITROSTAT) 0.4 MG SL tablet   Sublingual   Place 1 tablet (0.4 mg total) under the tongue every 5 (five) minutes as needed for chest pain.   25 tablet   3   . potassium chloride SA (K-DUR,KLOR-CON) 20 MEQ tablet   Oral   Take 1 tablet (20 mEq total) by mouth 2 (two) times daily.   60 tablet   6   . sodium fluoride (LURIDE) 1.1 (0.5 F) MG/ML SOLN   Oral   Take 1 drop by mouth daily as needed.         Marland Kitchen. spironolactone (ALDACTONE) 25 MG tablet   Oral   Take 25 mg by mouth daily.          Marland Kitchen. torsemide (DEMADEX) 20 MG tablet   Oral   Take 1 tablet (20 mg total) by mouth 2 (two) times daily.   180 tablet  3   . traZODone (DESYREL) 100 MG tablet   Oral   Take 100 mg by mouth at bedtime.         Marland Kitchen venlafaxine XR (EFFEXOR-XR) 150 MG 24 hr  capsule   Oral   Take 150 mg by mouth daily with breakfast.         . Vitamin D, Cholecalciferol, 1000 UNITS CAPS   Oral   Take 2,000 Units by mouth daily.           Allergies Lisinopril  Family History  Problem Relation Age of Onset  . Hypertension Mother   . Hypertension Father   . Hypertension Brother     Social History History  Substance Use Topics  . Smoking status: Never Smoker   . Smokeless tobacco: Never Used  . Alcohol Use: No    Review of Systems Constitutional: No fever/chills Eyes: No visual changes. ENT: No sore throat. Cardiovascular: Denies chest pain. Respiratory: + intermittent shortness of breath. Gastrointestinal: No abdominal pain.  No nausea, no vomiting.  No diarrhea.  No constipation. Genitourinary: Negative for dysuria. Musculoskeletal: Negative for back pain. Skin: Negative for rash. Neurological: Negative for headaches, focal weakness or numbness.  10-point ROS otherwise negative.  ____________________________________________   PHYSICAL EXAM:  VITAL SIGNS: ED Triage Vitals  Enc Vitals Group     BP 01/28/15 1716 110/90 mmHg     Pulse Rate 01/28/15 1716 76     Resp 01/28/15 1716 20     Temp 01/28/15 1716 98.5 F (36.9 C)     Temp Source 01/28/15 1716 Oral     SpO2 01/28/15 1716 100 %     Weight 01/28/15 1716 142 lb (64.411 kg)     Height 01/28/15 1716 5\' 6"  (1.676 m)     Head Cir --      Peak Flow --      Pain Score 01/28/15 1718 0     Pain Loc --      Pain Edu? --      Excl. in GC? --     Constitutional: Alert and oriented. Well appearing and in no acute distress. Eyes: Conjunctivae are normal. PERRL. EOMI. Head: Atraumatic. Nose: No congestion/rhinnorhea. Mouth/Throat: Mucous membranes are moist.  Oropharynx non-erythematous. Neck: No stridor.  Cardiovascular: Normal rate, regular rhythm. Grossly normal heart sounds.  Good peripheral circulation. Respiratory: Normal respiratory effort.  No retractions. Lungs  CTAB. Gastrointestinal: Soft and nontender. No distention. No abdominal bruits. No CVA tenderness. Genitourinary: deferred Musculoskeletal: No lower extremity tenderness nor edema.  No joint effusions. Very small area of linear nontender, non-erythematous swelling in the left forearm. Neurologic:  Normal speech and language. No gross focal neurologic deficits are appreciated. Speech is normal. No gait instability. Skin:  Skin is warm, dry and intact. No rash noted. Psychiatric: Mood and affect are normal. Speech and behavior are normal.  ____________________________________________   LABS (all labs ordered are listed, but only abnormal results are displayed)  Labs Reviewed  CBC - Abnormal; Notable for the following:    RBC 3.72 (*)    Hemoglobin 12.5 (*)    HCT 37.0 (*)    All other components within normal limits  BASIC METABOLIC PANEL - Abnormal; Notable for the following:    Sodium 134 (*)    Potassium 3.2 (*)    Chloride 96 (*)    Glucose, Bld 349 (*)    BUN 31 (*)    Creatinine, Ser 1.93 (*)    Calcium 8.8 (*)  GFR calc non Af Amer 35 (*)    GFR calc Af Amer 40 (*)    All other components within normal limits  TROPONIN I - Abnormal; Notable for the following:    Troponin I 0.04 (*)    All other components within normal limits  HEPATIC FUNCTION PANEL - Abnormal; Notable for the following:    Albumin 3.4 (*)    Alkaline Phosphatase 167 (*)    Total Bilirubin 1.8 (*)    Bilirubin, Direct 0.6 (*)    Indirect Bilirubin 1.2 (*)    All other components within normal limits  URINALYSIS COMPLETEWITH MICROSCOPIC ([facility] ONLY) - Abnormal; Notable for the following:    Color, Urine YELLOW (*)    APPearance HAZY (*)    Glucose, UA 50 (*)    Squamous Epithelial / LPF 0-5 (*)    All other components within normal limits  TROPONIN I - Abnormal; Notable for the following:    Troponin I 0.05 (*)    All other components within normal limits  GLUCOSE, CAPILLARY - Abnormal; Notable  for the following:    Glucose-Capillary 277 (*)    All other components within normal limits   ____________________________________________  EKG  ED ECG REPORT I, Gayla Doss, the attending physician, personally viewed and interpreted this ECG.   Date: 01/28/2015  EKG Time: 17:27  Rate: 77  Rhythm: sinus rhythm with occasional PACs and PVCs  Axis: left  Intervals:left bundle branch block  ST&T Change: No acute ST segment elevation  ____________________________________________  RADIOLOGY  CXR IMPRESSION: No radiographic evidence of acute cardiopulmonary disease. Atherosclerosis.  CT head IMPRESSION: 1. No acute intracranial findings are observed. 2. 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 Although not urgent, MRI of the brain with and without contrast would be suggested to assess for vestibular schwannoma or other supporting evidence. 3. Periventricular white matter and corona radiata hypodensities favor chronic ischemic microvascular white matter disease.   Venous doppler US of left arm IMPRESSION: 1. No evidence of deep venous thrombosis. 2. Nonocclusive superficial thrombus in area of swelling in the forearm.     ____________________________________________   PROCEDURES  Procedure(s) performed: None  Critical Care performed: No  ____________________________________________   INITIAL IMPRESSION / ASSESSMENT AND PLAN / ED COURSE  Pertinent labs & imaging results that were available during my care of the patient were reviewed by me and considered in my medical decision making (see chart for details).  Kyle Morton is a 64 y.o. male with history of ischemic cardiomyopathy, CHF with EF of 20%, hypertension, hyperlipidemia, type 2 diabetes, status post right and left heart catheterization on 01/07/2015 with angioplasty  who presents with lightheadedness/near syncope and fall as well as focal area of swelling to the left forearm for the past 2 weeks. On exam, he is nontoxic appearing and in no acute distress. Vital signs are stable, he is afebrile. He has a very small focal area of swelling on the left arm which may be consistent with a superficial phlebitis and I doubt DVT however will obtain ultrasound given his concern. Additionally, he has had positional lightheadedness and shortness of breath on and off since discharge from the hospital. He was hypotensive when he checked his blood pressure today however here he is been normotensive to mildly hypotensive. Suspect he may be over diuresed and his cardiology doctor has been attempting to titrate his diuretics as an outpatient to  mitigate this. He has an intact neurological exam. We'll plan for basic cardiac labs, chest x-ray, CT head. Reassess for disposition.   ----------------------------------------- 9:49 PM on 01/28/2015 ----------------------------------------- Patient with mild elevations in BUN creatinine, suspect dehydration secondary to overdiuresis, intravascular depletion. I gave a very small 250 mL bolus of normal saline with improvement of his blood pressure. His initial troponin was elevated at 0.04. He has never had an elevated troponin here before. Second troponin continues to be elevated and is increasing at 0.05. According to his most recent catheterization he has severe ostial first diagonal disease that would likely benefit from PCI however this was not accomplished previously due to his tenuous fluid balance. Given his exertional dyspnea and lightheadedness, elevated and increasing troponin, I have consulted  the hospitalist for admission. Doppler ultrasound of the left upper extremity is notable for superficial not occlusive thrombus, no DVT. ASA ordered.   ____________________________________________   FINAL CLINICAL IMPRESSION(S) / ED  DIAGNOSES  Final diagnoses:  Swelling  SOB (shortness of breath) on exertion  Lightheadedness  NSTEMI (non-ST elevated myocardial infarction)      Gayla Doss, MD 01/28/15 2152

## 2015-01-28 NOTE — ED Notes (Signed)
Noticed swelling to left forearm ..had IV placed last week..wants to be checked for blood clot

## 2015-01-28 NOTE — ED Notes (Signed)
Dr.Gayle made aware on Troponin lab value 0.04

## 2015-01-29 ENCOUNTER — Ambulatory Visit: Payer: Non-veteran care

## 2015-01-29 DIAGNOSIS — N179 Acute kidney failure, unspecified: Secondary | ICD-10-CM | POA: Diagnosis not present

## 2015-01-29 DIAGNOSIS — I25119 Atherosclerotic heart disease of native coronary artery with unspecified angina pectoris: Secondary | ICD-10-CM

## 2015-01-29 DIAGNOSIS — I255 Ischemic cardiomyopathy: Secondary | ICD-10-CM | POA: Diagnosis not present

## 2015-01-29 DIAGNOSIS — R42 Dizziness and giddiness: Secondary | ICD-10-CM | POA: Diagnosis not present

## 2015-01-29 LAB — BASIC METABOLIC PANEL
ANION GAP: 7 (ref 5–15)
BUN: 30 mg/dL — AB (ref 6–20)
CHLORIDE: 101 mmol/L (ref 101–111)
CO2: 27 mmol/L (ref 22–32)
CREATININE: 1.5 mg/dL — AB (ref 0.61–1.24)
Calcium: 8.6 mg/dL — ABNORMAL LOW (ref 8.9–10.3)
GFR, EST AFRICAN AMERICAN: 54 mL/min — AB (ref >60)
GFR, EST NON AFRICAN AMERICAN: 47 mL/min — AB (ref >60)
Glucose, Bld: 284 mg/dL — ABNORMAL HIGH (ref 65–99)
Potassium: 3.2 mmol/L — ABNORMAL LOW (ref 3.5–5.1)
Sodium: 135 mmol/L (ref 135–145)

## 2015-01-29 LAB — GLUCOSE, CAPILLARY
Glucose-Capillary: 196 mg/dL — ABNORMAL HIGH (ref 65–99)
Glucose-Capillary: 144 mg/dL — ABNORMAL HIGH (ref 65–99)

## 2015-01-29 MED ORDER — POTASSIUM CHLORIDE 20 MEQ PO PACK
20.0000 meq | PACK | Freq: Two times a day (BID) | ORAL | Status: DC
  Administered 2015-01-29: 20 meq via ORAL
  Filled 2015-01-29: qty 1

## 2015-01-29 MED ORDER — TORSEMIDE 20 MG PO TABS
20.0000 mg | ORAL_TABLET | Freq: Every day | ORAL | Status: DC
Start: 2015-01-29 — End: 2015-02-20

## 2015-01-29 NOTE — Progress Notes (Signed)
Pt discharged home after cards consult, MD Juliene PinaMody ok with DC per telephone, pt given all his f/u appts and scrip, IV site DCd, bleeding controlled, tele monitor turned in. Pt verbalized understanding of his instructions, left hospital in car with his daughter

## 2015-01-29 NOTE — Discharge Summary (Signed)
[facility]   PATIENT NAME: Kyle Morton    MR#:  [id number]  DATE OF BIRTH:  Apr 27, 1951  DATE OF ADMISSION:  01/28/2015 ADMITTING PHYSICIAN: Milagros Loll, MD  DATE OF DISCHARGE: 01/29/2015   PRIMARY CARE PHYSICIAN: Toya Smothers, PA    ADMISSION DIAGNOSIS:  Lightheadedness [R42] Swelling [R60.9] SOB (shortness of breath) on exertion [R06.02] NSTEMI (non-ST elevated myocardial infarction) [I21.4]  DISCHARGE DIAGNOSIS:  Active Problems:   ARF (acute renal failure)   SECONDARY DIAGNOSIS:   Past Medical History  Diagnosis Date  . Pleural effusion   . Chronic combined systolic and diastolic CHF (congestive heart failure)     a. reported baseline EF 30-35%; b. echo 12/2014: EF 20-25%, diffuse HK, restrictive filling pattern, severely dilated LA, mod MR/TR/PR, PASP 70 mm Hg, IVC dilted c/w elevated CVP  . Coronary artery disease     a. xience stent to D1 in 2007; 2.5 x 15 mm stent placed to OM3 in 2013, w/ 3 stents placed in 2015 (details not available);  b. 01/2015 Cath: LM nl, LAD 60p, 36m ISR, D1 95ost, 20 ISR, RI min irregs, LCX 20 ISR, OM1 small, OM2 30 ISR, OM3 40 ISR, RCA 100p, EF 15%, 2+ MR, elev R heart pressures.  . Benign essential HTN   . Hyperlipidemia   . DM2 (diabetes mellitus, type 2)   . PVC's (premature ventricular contractions)   . Vitamin D deficiency   . GERD (gastroesophageal reflux disease)   . Carotid artery stenosis     a. carotid dopplers 12/2014: less than 39% ICA bilaterally, f/u 1 year  . Depression   . Gout     feet     HOSPITAL COURSE:  This a very pleasant 64 year old male with a history of chronic systolic heart failure EF 25%, chronic kidney disease stage III, diabetes and hypertension who presented with hypotension and lightheadedness. For further details please further H&P.  1. Acute on chronic renal failure stage III: Patient was admitted to the hospital service. He was started on gentle  hydration. Torsemide was held. Creatinine has improved.   2. CAD: Stable. Patient needs repeat catheterization as outpatient per her last cardiology note. Troponin has been checked twice in the emergency room and is stable. No chest pain. EKG shows no acute changes.  3.  Hypertension: Blood pressure is improved with IV fluids. Patient may resume his outpatient occasions. He does state that with losartan he has a cough. I will hold this medication until he has follow-up with cardiology.  4. Insulin-dependent diabetes mellitus He will continue his outpatient insulin.  5. Chronic systolic CHF: Patient may resume his outpatient medications. I'm holding losartan due to the fact the patient is complaining of a cough. Patient will continue with his torsemide as previously scheduled by Dr. Orpah Clinton.   DISCHARGE CONDITIONS AND DIET:  Heart healthy  CONSULTS OBTAINED:  Treatment Team:  Antonieta Iba, MD  DRUG ALLERGIES:  No Known Allergies  DISCHARGE MEDICATIONS:   Current Discharge Medication List    CONTINUE these medications which have CHANGED   Details  torsemide (DEMADEX) 20 MG tablet Take 1 tablet (20 mg total) by mouth daily. Patient will take this medication according to his weight, he already has plan for medication dosing given by Dr Benetta Spar: 180 tablet, Refills: 3      CONTINUE these medications which have NOT CHANGED   Details  allopurinol (ZYLOPRIM) 100 MG tablet Take 200 mg by mouth daily.  aspirin EC 81 MG tablet Take 81 mg by mouth daily.    atorvastatin (LIPITOR) 80 MG tablet Take 80 mg by mouth daily.    carvedilol (COREG) 12.5 MG tablet Take 12.5 mg by mouth 2 (two) times daily with a meal.    cetirizine (ZYRTEC) 10 MG tablet Take 10 mg by mouth daily.    clopidogrel (PLAVIX) 75 MG tablet Take 75 mg by mouth daily.    colchicine 0.6 MG tablet Take 0.6 mg by mouth as needed.     dextrose (GLUTOSE) 40 % GEL Take 1 Tube by mouth once as needed for low  blood sugar.    fluticasone (FLONASE) 50 MCG/ACT nasal spray Place 2 sprays into both nostrils daily as needed for allergies or rhinitis.     Hexylresorcinol (THROAT LOZENGES MT) Use as directed 1 Dose in the mouth or throat every 4 (four) hours as needed (for sore throat).    insulin glargine (LANTUS) 100 UNIT/ML injection Inject 25 Units into the skin at bedtime.    isosorbide mononitrate (IMDUR) 60 MG 24 hr tablet Take 0.5 tablets (30 mg total) by mouth daily.    Menthol-Methyl Salicylate (THERA-GESIC EX) Apply 1 Dose topically 2 (two) times daily.     nitroGLYCERIN (NITROSTAT) 0.4 MG SL tablet Place 1 tablet (0.4 mg total) under the tongue every 5 (five) minutes as needed for chest pain. Qty: 25 tablet, Refills: 3    potassium chloride SA (K-DUR,KLOR-CON) 20 MEQ tablet Take 1 tablet (20 mEq total) by mouth 2 (two) times daily. Qty: 60 tablet, Refills: 6    sodium fluoride (LURIDE) 1.1 (0.5 F) MG/ML SOLN Take 1 drop by mouth daily as needed.    spironolactone (ALDACTONE) 25 MG tablet Take 25 mg by mouth daily.     traZODone (DESYREL) 100 MG tablet Take 100 mg by mouth at bedtime.    venlafaxine XR (EFFEXOR-XR) 150 MG 24 hr capsule Take 150 mg by mouth daily with breakfast.    Vitamin D, Cholecalciferol, 1000 UNITS CAPS Take 2,000 Units by mouth daily.      STOP taking these medications     losartan (COZAAR) 25 MG tablet      chlorhexidine (PERIDEX) 0.12 % solution               Today   CHIEF COMPLAINT:  Patient is doing well this morning. Patient denies shortness of breath or chest in.   VITAL SIGNS:  Blood pressure 124/91, pulse 75, temperature 98.2 F (36.8 C), temperature source Oral, resp. rate 16, height 5\' 6"  (1.676 m), weight 65.273 kg (143 lb 14.4 oz), SpO2 100 %.   REVIEW OF SYSTEMS:  Review of Systems  Constitutional: Negative for fever, chills and malaise/fatigue.  HENT: Negative for sore throat.   Eyes: Negative for blurred vision.   Respiratory: Negative for cough, hemoptysis, shortness of breath and wheezing.   Cardiovascular: Negative for chest pain, palpitations and leg swelling.  Gastrointestinal: Negative for nausea, vomiting, abdominal pain, diarrhea and blood in stool.  Genitourinary: Negative for dysuria.  Musculoskeletal: Negative for back pain.  Neurological: Negative for dizziness, tremors and headaches.  Endo/Heme/Allergies: Does not bruise/bleed easily.     PHYSICAL EXAMINATION:  GENERAL:  64 y.o.-year-old patient lying in the bed with no acute distress.  NECK:  Supple, no jugular venous distention. No thyroid enlargement, no tenderness.  LUNGS: Normal breath sounds bilaterally, no wheezing, rales,rhonchi  No use of accessory muscles of respiration.  CARDIOVASCULAR: S1, S2 normal. No murmurs, rubs,  or gallops.  ABDOMEN: Soft, non-tender, non-distended. Bowel sounds present. No organomegaly or mass.  EXTREMITIES: No pedal edema, cyanosis, or clubbing.  PSYCHIATRIC: The patient is alert and oriented x 3.  SKIN: No obvious rash, lesion, or ulcer.   DATA REVIEW:   CBC  Recent Labs Lab 01/28/15 1737  WBC 5.9  HGB 12.5*  HCT 37.0*  PLT 225    Chemistries   Recent Labs Lab 01/28/15 1737 01/29/15 0351  NA 134* 135  K 3.2* 3.2*  CL 96* 101  CO2 27 27  GLUCOSE 349* 284*  BUN 31* 30*  CREATININE 1.93* 1.50*  CALCIUM 8.8* 8.6*  AST 26  --   ALT 20  --   ALKPHOS 167*  --   BILITOT 1.8*  --     Cardiac Enzymes  Recent Labs Lab 01/28/15 1737 01/28/15 2056  TROPONINI 0.04* 0.05*    Microbiology Results  @[email or web address] @  RADIOLOGY:  Ct Head Wo Contrast  01/28/2015     IMPRESSION: 1. No acute intracranial findings are observed. 2. 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 Although not urgent, MRI of the brain with and without contrast would be  suggested to assess for vestibular schwannoma or other supporting evidence. 3. Periventricular white matter and corona radiata hypodensities favor chronic ischemic microvascular white matter disease.   Electronically Signed   By: Gaylyn Rong M.D.   On: 01/28/2015 18:58   US Venous Img Upper Uni Left  01/28/2015   C  IMPRESSION: 1. No evidence of deep venous thrombosis. 2. Nonocclusive superficial thrombus in area of swelling in the forearm.   Electronically Signed   By: Rubye Oaks M.D.   On: 01/28/2015 19:48   Dg Chest Portable 1 View  01/28/2015   CLINICAL DATA:  64 year old male with a history of left forearm swelling.  EXAM: PORTABLE CHEST - 1 VIEW  COMPARISON:  01/02/2015  FINDINGS: Cardiomediastinal silhouette unchanged in size and contour. Atherosclerotic calcifications of the aortic arch.  No confluent airspace disease, pneumothorax, or pleural effusion.  No displaced fracture.  IMPRESSION: No radiographic evidence of acute cardiopulmonary disease.  Atherosclerosis.  Signed,  Yvone Neu. Loreta Ave, DO  [facility]  [facility]   Electronically Signed   By: Gilmer Mor D.O.   On: 01/28/2015 18:05      Management plans discussed with the patient and he is in agreement. Stable for discharge home  Patient should follow up with PCP in one week and Cardiology.  CODE STATUS:     Code Status Orders        Start     Ordered   01/28/15 2240  Full code   Continuous     01/28/15 2241      TOTAL TIME TAKING CARE OF THIS PATIENT: 35 minutes.    Mylynn Dinh M.D on 01/29/2015 at 10:41 AM  Between 7am to 6pm - Pager - [phone] After 6pm go to [email or web address] - password [email or web address] [facility]  [facility]  Office  [phone]  CC: Primary care physician; Toya Smothers, PA

## 2015-01-29 NOTE — Consult Note (Addendum)
Cardiology Consultation Note  Patient ID: Kyle Morton, MRN: [id number], DOB/AGE: [birth year] 64 y.o. Admit date: 01/28/2015   Date of Consult: 01/29/2015 Primary Physician: Toya Smothers, PA Primary Cardiologist: Dr. Mariah Milling, MD  Chief Complaint: Dizziness Reason for Consult: Management of chronic systolic CHF in the setting of acute on CKD stage III and hypotension   HPI: 64 y.o. male with h/o CAD s/p multiple PCI's of the LAD, D1, LCX, OM2, and OM3 with EF of 15% by LV gram on 01/07/2015.He also has a h/o ischemic cardiomyopathy and chronic combined CHF.EF on echo in May 2016, was found to be 20-25% with right sided pressures found to be 70 mm Hg. He has a history of CKD stage III with a baseline SCr of approximately 1.4 to 1.5.   He was seen in clinic on 6/2 with complaints of progressive fatigue, dyspnea, and dizziness - all occurring with minimal activity. Due to concern for low output CHF, decision was made to pursue right and left heart cardiac catheterization. Patient presented to the [facility] cath lab on 6/7 and underwent diagnostic catheterization revealing patent LAD, D1, LCx, OM2, and OM3 stents with a chronic total occlusion of the RCA. The D1 had a new 95% ostial stenosis. It was felt that this would require a complex PCI at the bifurcation of the LAD and D1.Decision was made to defer PCI to a later date as he was found to be volume overloaded with low output heart failure (CO 3.14 L/min; CI 1.75 L/min/m^2) and a LVEDP of 27 mmHg. As a result, he was admitted for aggressive IV diuresis. He was placed on IV Lasix and responded well.For this admission, he diuresed 8.29 L.His weight came down from 165 lbs on admission to 154 lbs on discharge.He was transitioned back to torsemide at 40 mg BID on 6/9.This was double his prior home dose.With this, his creatinine was elevated slightly from 1.33 to 1.43.As a result, he was discharged back on torsemide 20 mg BID and continued on spironolactone at  25 mg daily. He did call the office on 6/14 stating he felt like torsemide 20 mg bid was too much and he was worried about dehydration. He was placed on a sliding scale regimen based on his weight at that time. For weight <146 he would take 20 mg daily. For weight 146-150 he would take 20 mg bid. For weight >150 he would take 40 mg bid. He was continued on spironolactone and KCl.  He presented to [facility] on 6/29 with an episode of lightheadedness while in the shower with associated near-syncope. He had to sit on the edge of the bathtub and ended up falling backwards, however he did not end up hitting his head or suffer LOC. He checked his BP at home and found it to be 78/55. He has felt like the torsemide 20 mg daily may have been too much diuretic in the setting of recently increased spironolactone from 12.5 mg to 25 mg. His weight has averaged around 142 to 143 since his discharge. He is breathing much better since his discharge. He is able to sleep fully supine. No lower extremity edema, abdominal fullness, cough, or early satiety. He is tolerating all of his medications without issues.   Upon his arrival he was found to be hypotensive with BP of 110/90 which improved with a 250 mL NS bolus. Initial troponin was found to be mildly elevated at 0.04-->0.05 in the setting of his hypotension. He has remained chest pain free. Head CT  showed no acute intracranial process, though there did appear to be multiple bilateral meningiomas to raise the possibility of neurofibromatosis. An MRI was recommended. CXR showed no acute cardiopulmonary disease. SCr was elevated at 1.93 (baseline of 1.4-1.5). BUN 31. SCr has improved this afternoon to 1.50 after hydration. Potassium was found to be low at 3.2-->3.2. WBC 5.9. HGB 12.5. His torsemide has been held at this time. He has been continued on his remaining cardiac medications, including spironolactone. He is -310 mL for the admission to date. He also underwent upper extremity  US to evaluate swelling of the left arm which was negative for left upper extremity DVT. Study did show nonocclusive superficial thrombus in area of swelling of the forearm.         Past Medical History  Diagnosis Date  . Pleural effusion   . Chronic combined systolic and diastolic CHF (congestive heart failure)     a. reported baseline EF 30-35%; b. echo 12/2014: EF 20-25%, diffuse HK, restrictive filling pattern, severely dilated LA, mod MR/TR/PR, PASP 70 mm Hg, IVC dilted c/w elevated CVP  . Coronary artery disease     a. xience stent to D1 in 2007; 2.5 x 15 mm stent placed to OM3 in 2013, w/ 3 stents placed in 2015 (details not available);  b. 01/2015 Cath: LM nl, LAD 60p, 35m ISR, D1 95ost, 20 ISR, RI min irregs, LCX 20 ISR, OM1 small, OM2 30 ISR, OM3 40 ISR, RCA 100p, EF 15%, 2+ MR, elev R heart pressures.  . Benign essential HTN   . Hyperlipidemia   . DM2 (diabetes mellitus, type 2)   . PVC's (premature ventricular contractions)   . Vitamin D deficiency   . GERD (gastroesophageal reflux disease)   . Carotid artery stenosis     a. carotid dopplers 12/2014: less than 39% ICA bilaterally, f/u 1 year  . Depression   . Gout     feet       Most Recent Cardiac Studies: Cardiac catheterization 01-19-15:  Coronary Findings    Dominance: Left   Left Main  The vessel is angiographically normal.     Left Anterior Descending   . Prox LAD lesion, 60% stenosed. The lesion is type C, discrete located at major branch.   . Mid LAD lesion, 20% stenosed. The lesion was previously treated with a stent (unknown type) .   Marland Kitchen First Product manager   . Ost 1st Diag to 1st Diag lesion, 95% stenosed. The lesion is type C, discrete .   . 1st Diag lesion, 20% stenosed. The lesion was previously treated with a stent (unknown type) .     [employer]  There is mildthe vessel.     Left Circumflex   . Prox Cx to Mid Cx lesion, 20% stenosed. diffuse . The lesion was previously treated with a  stent (unknown type) .   Marland Kitchen First Obtuse Marginal Branch   The vessel is small in size. There is mild.   . Second Obtuse Marginal Branch   . Ost 2nd Mrg to 2nd Mrg lesion, 30% stenosed. The lesion was previously treated with a stent (unknown type) .   Marland Kitchen Third Biomedical engineer   . Ost 3rd Mrg to 3rd Mrg lesion, 40% stenosed. The lesion was previously treated with a stent (unknown type) .   Marland Kitchen Fourth Obtuse Marginal Branch   There is mild.   . Left Posterior Descending Artery   There is mild.     Right Coronary  Artery   . Prox RCA lesion, 100% stenosed.       Right Heart Pressures Hemodynamic findings consistent with moderate pulmonary hypertension. Elevated LV EDP consistent with volume overload. Cardiac output: 3.14 .  Cardiac index: 1.75    Echo 12/24/2014:  Study Conclusions  - Left ventricle: The cavity size was normal. Wall thickness was normal. Systolic function was severely reduced. The estimated ejection fraction was in the range of 20% to 25%. Diffuse hypokinesis. Doppler parameters are consistent with restrictive physiology, indicative of decreased left ventricular diastolic compliance and/or increased left atrial pressure. - Mitral valve: There was moderate regurgitation. - Left atrium: The atrium was severely dilated. - Tricuspid valve: There was moderate regurgitation. - Pulmonic valve: There was moderate regurgitation. - Pulmonary arteries: Systolic pressure was severely increased. PA peak pressure: 70 mm Hg (S). - Inferior vena cava: The vessel was dilated. The respirophasic diameter changes were blunted (< 50%), consistent with elevated central venous pressure.   Surgical History:  Past Surgical History  Procedure Laterality Date  . Cardiac catheterization  2013,2007  . Coronary angioplasty with stent placement  06/05/2012    OM3  . Coronary angioplasty with stent placement  04/25/2006    RAD  . Cardiac catheterization N/A 01/07/2015      Procedure: Right and Left Heart Cath;  Surgeon: Iran Ouch, MD;  Location: [facility] INVASIVE CV LAB;  Service: Cardiovascular;  Laterality: N/A;     Home Meds: Prior to Admission medications   Medication Sig Start Date End Date Taking? Authorizing Provider  allopurinol (ZYLOPRIM) 100 MG tablet Take 200 mg by mouth daily.    Yes Historical Provider, MD  aspirin EC 81 MG tablet Take 81 mg by mouth daily.   Yes Historical Provider, MD  atorvastatin (LIPITOR) 80 MG tablet Take 80 mg by mouth daily.   Yes Historical Provider, MD  carvedilol (COREG) 12.5 MG tablet Take 12.5 mg by mouth 2 (two) times daily with a meal.   Yes Historical Provider, MD  cetirizine (ZYRTEC) 10 MG tablet Take 10 mg by mouth daily.   Yes Historical Provider, MD  clopidogrel (PLAVIX) 75 MG tablet Take 75 mg by mouth daily.   Yes Historical Provider, MD  colchicine 0.6 MG tablet Take 0.6 mg by mouth as needed.    Yes Historical Provider, MD  dextrose (GLUTOSE) 40 % GEL Take 1 Tube by mouth once as needed for low blood sugar.   Yes Historical Provider, MD  fluticasone (FLONASE) 50 MCG/ACT nasal spray Place 2 sprays into both nostrils daily as needed for allergies or rhinitis.    Yes Historical Provider, MD  Hexylresorcinol (THROAT LOZENGES MT) Use as directed 1 Dose in the mouth or throat every 4 (four) hours as needed (for sore throat).   Yes Historical Provider, MD  insulin glargine (LANTUS) 100 UNIT/ML injection Inject 25 Units into the skin at bedtime.   Yes Historical Provider, MD  isosorbide mononitrate (IMDUR) 60 MG 24 hr tablet Take 0.5 tablets (30 mg total) by mouth daily. 01/10/15  Yes Ok Anis, NP  losartan (COZAAR) 25 MG tablet Take 1 tablet (25 mg total) by mouth daily. 01/10/15  Yes Ok Anis, NP  Menthol-Methyl Salicylate (THERA-GESIC EX) Apply 1 Dose topically 2 (two) times daily.    Yes Historical Provider, MD  nitroGLYCERIN (NITROSTAT) 0.4 MG SL tablet Place 1 tablet (0.4 mg total) under  the tongue every 5 (five) minutes as needed for chest pain. 01/10/15  Yes Ok Anis,  NP  potassium chloride SA (K-DUR,KLOR-CON) 20 MEQ tablet Take 1 tablet (20 mEq total) by mouth 2 (two) times daily. 01/10/15  Yes Ok Anis, NP  sodium fluoride (LURIDE) 1.1 (0.5 F) MG/ML SOLN Take 1 drop by mouth daily as needed.   Yes Historical Provider, MD  spironolactone (ALDACTONE) 25 MG tablet Take 25 mg by mouth daily.    Yes Historical Provider, MD  traZODone (DESYREL) 100 MG tablet Take 100 mg by mouth at bedtime.   Yes Historical Provider, MD  venlafaxine XR (EFFEXOR-XR) 150 MG 24 hr capsule Take 150 mg by mouth daily with breakfast.   Yes Historical Provider, MD  Vitamin D, Cholecalciferol, 1000 UNITS CAPS Take 2,000 Units by mouth daily.   Yes Historical Provider, MD  torsemide (DEMADEX) 20 MG tablet Take 1 tablet (20 mg total) by mouth daily. Patient will take this medication according to his weight, he already has plan for medication dosing given by Dr Mariah Milling 01/29/15   Adrian Saran, MD    Inpatient Medications:  . allopurinol  200 mg Oral Daily  . aspirin EC  81 mg Oral Daily  . atorvastatin  80 mg Oral Daily  . carvedilol  12.5 mg Oral BID WC  . clopidogrel  75 mg Oral Daily  . enoxaparin (LOVENOX) injection  40 mg Subcutaneous Q24H  . insulin aspart  0-15 Units Subcutaneous TID WC  . insulin aspart  0-5 Units Subcutaneous QHS  . insulin glargine  25 Units Subcutaneous QHS  . isosorbide mononitrate  30 mg Oral Daily  . loratadine  10 mg Oral Daily  . losartan  25 mg Oral Daily  . potassium chloride  20 mEq Oral BID  . potassium chloride SA  20 mEq Oral BID  . spironolactone  25 mg Oral Daily  . traZODone  100 mg Oral QHS  . venlafaxine XR  150 mg Oral Q breakfast      Allergies: No Known Allergies  History   Social History  . Marital Status: Divorced    Spouse Name: N/A  . Number of Children: N/A  . Years of Education: N/A   Occupational History  . Not on  file.   Social History Main Topics  . Smoking status: Never Smoker   . Smokeless tobacco: Never Used  . Alcohol Use: No  . Drug Use: No  . Sexual Activity: No   Other Topics Concern  . Not on file   Social History Narrative     Family History  Problem Relation Age of Onset  . Hypertension Mother   . Hypertension Father   . Hypertension Brother      Review of Systems: Review of Systems  Constitutional: Positive for weight loss and malaise/fatigue. Negative for fever, chills and diaphoresis.       Down to low of 142   HENT: Negative for congestion.   Eyes: Negative for blurred vision, discharge and redness.  Respiratory: Negative for cough, hemoptysis, sputum production, shortness of breath and wheezing.   Cardiovascular: Negative for chest pain, palpitations, orthopnea, claudication, leg swelling and PND.  Gastrointestinal: Negative for heartburn, nausea, vomiting and abdominal pain.  Genitourinary: Negative for hematuria.  Musculoskeletal: Negative for myalgias, joint pain and falls.  Skin: Negative for itching and rash.  Neurological: Positive for dizziness and weakness. Negative for sensory change, speech change, focal weakness, seizures, loss of consciousness and headaches.  Endo/Heme/Allergies: Does not bruise/bleed easily.  Psychiatric/Behavioral: Negative for depression and substance abuse. The patient is not nervous/anxious.  All other systems reviewed and are negative.    Labs:  Recent Labs  01/28/15 1737 01/28/15 2056  TROPONINI 0.04* 0.05*   Lab Results  Component Value Date   WBC 5.9 01/28/2015   HGB 12.5* 01/28/2015   HCT 37.0* 01/28/2015   MCV 99.6 01/28/2015   PLT 225 01/28/2015    Recent Labs Lab 01/28/15 1737 01/29/15 0351  NA 134* 135  K 3.2* 3.2*  CL 96* 101  CO2 27 27  BUN 31* 30*  CREATININE 1.93* 1.50*  CALCIUM 8.8* 8.6*  PROT 7.9  --   BILITOT 1.8*  --   ALKPHOS 167*  --   ALT 20  --   AST 26  --   GLUCOSE 349* 284*   No  results found for: CHOL, HDL, LDLCALC, TRIG No results found for: DDIMER  Radiology/Studies:  Dg Chest 2 View  01/03/2015   CLINICAL DATA:  Pre cardiac catheterization  EXAM: CHEST  2 VIEW  COMPARISON:  None.  FINDINGS: Mild cardiac enlargement. No pleural effusion or edema. Both lungs are clear. The visualized skeletal structures are unremarkable.  IMPRESSION: 1. No acute cardiopulmonary abnormalities.   Electronically Signed   By: Signa Kellaylor  Stroud M.D.   On: 01/03/2015 07:55   Ct Head Wo Contrast  01/28/2015   CLINICAL DATA:  Weakness.  Dizziness.  Fall.  EXAM: CT HEAD WITHOUT CONTRAST  TECHNIQUE: Contiguous axial images were obtained from the base of the skull through the vertex without intravenous contrast.  COMPARISON:  None.  FINDINGS: The brainstem, cerebellum, cerebral peduncles, thalamus, basal ganglia, basilar cisterns, and ventricular system appear within normal limits. Periventricular white matter and corona radiata hypodensities favor chronic ischemic microvascular white matter disease. No intracranial hemorrhage or acute CVA.  There are bilateral calcified extra-axial masses favoring meningioma is scattered adjacent to the cerebrum including along the falx.  The porous acoustic is appears somewhat wide bilaterally although I am not certain that there is necessarily a vestibular schwannoma.  No intracranial hemorrhage, mass lesion, or acute CVA.  There is atherosclerotic calcification of the cavernous carotid arteries bilaterally.  IMPRESSION: 1. No acute intracranial findings are observed. 2. 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 Although not urgent, MRI of the brain with and without contrast would be suggested to assess for vestibular schwannoma or other supporting evidence. 3. Periventricular white matter and corona radiata hypodensities favor chronic ischemic  microvascular white matter disease.   Electronically Signed   By: Gaylyn RongWalter  Liebkemann M.D.   On: 01/28/2015 18:58   [place] Venous Img Upper Uni Left  01/28/2015   CLINICAL DATA:  Left forearm swelling. Pain. Recent cardiac catheterization utilizing upper extremity access.  EXAM: LEFT UPPER EXTREMITY VENOUS DOPPLER ULTRASOUND  TECHNIQUE: Gray-scale sonography with graded compression, as well as color Doppler and duplex ultrasound were performed to evaluate the upper extremity deep venous system from the level of the subclavian vein and including the jugular, axillary, basilic, radial, ulnar and upper cephalic vein. Spectral Doppler was utilized to evaluate flow at rest and with distal augmentation maneuvers.  COMPARISON:  None.  FINDINGS: Contralateral Subclavian Vein: Respiratory phasicity is normal and symmetric with the symptomatic side. No evidence of thrombus. Normal compressibility.  Internal Jugular Vein: No evidence of thrombus. Normal compressibility, respiratory phasicity and response to augmentation.  Subclavian Vein: No evidence of thrombus. Normal compressibility, respiratory phasicity and response to augmentation.  Axillary Vein: No evidence of  thrombus. Normal compressibility, respiratory phasicity and response to augmentation.  Cephalic Vein: No evidence of thrombus. Normal compressibility, respiratory phasicity and response to augmentation.  Basilic Vein: No evidence of thrombus. Normal compressibility, respiratory phasicity and response to augmentation.  Brachial Veins: No evidence of thrombus. Normal compressibility, respiratory phasicity and response to augmentation.  Radial Veins: No evidence of thrombus. Normal compressibility, respiratory phasicity and response to augmentation.  Ulnar Veins: No evidence of thrombus. Normal compressibility, respiratory phasicity and response to augmentation.  Venous Reflux:  None visualized.  Other Findings: In the area swelling in the lateral forearm there is  nonocclusive thrombus within a superficial vein, likely branch of the cephalic.  IMPRESSION: 1. No evidence of deep venous thrombosis. 2. Nonocclusive superficial thrombus in area of swelling in the forearm.   Electronically Signed   By: Rubye Oaks M.D.   On: 01/28/2015 19:48   Dg Chest Portable 1 View  01/28/2015   CLINICAL DATA:  64 year old male with a history of left forearm swelling.  EXAM: PORTABLE CHEST - 1 VIEW  COMPARISON:  01/02/2015  FINDINGS: Cardiomediastinal silhouette unchanged in size and contour. Atherosclerotic calcifications of the aortic arch.  No confluent airspace disease, pneumothorax, or pleural effusion.  No displaced fracture.  IMPRESSION: No radiographic evidence of acute cardiopulmonary disease.  Atherosclerosis.  Signed,  Yvone Neu. Loreta Ave, DO  [facility]  [facility]   Electronically Signed   By: Gilmer Mor D.O.   On: 01/28/2015 18:05    EKG: NSR, 77 bpm, left axis deviation, occasional PVCs and PACs, incomplete LBBB, prolonged QTc, inferior Q wave, nonspecific lateral st/t changes   Weights: Filed Weights   01/28/15 1716 01/28/15 2322  Weight: 142 lb (64.411 kg) 143 lb 14.4 oz (65.273 kg)     Physical Exam: Blood pressure 124/91, pulse 75, temperature 98.2 F (36.8 C), temperature source Oral, resp. rate 16, height 5\' 6"  (1.676 m), weight 143 lb 14.4 oz (65.273 kg), SpO2 100 %. Body mass index is 23.24 kg/(m^2). General: Well developed, well nourished, in no acute distress. Head: Normocephalic, atraumatic, sclera non-icteric, no xanthomas, nares are without discharge.  Neck: Negative for carotid bruits. JVD not elevated. Lungs: Clear bilaterally to auscultation without wheezes, rales, or rhonchi. Breathing is unlabored. Heart: RRR with S1 S2. No murmurs, rubs, or gallops appreciated. Abdomen: Soft, non-tender, non-distended with normoactive bowel sounds. No hepatomegaly. No rebound/guarding. No obvious  abdominal masses. Msk:  Strength and tone appear normal for age. Extremities: No clubbing or cyanosis. No edema.  Distal pedal pulses are 2+ and equal bilaterally. Neuro: Alert and oriented X 3. No facial asymmetry. No focal deficit. Moves all extremities spontaneously. Psych:  Responds to questions appropriately with a normal affect.    Assessment and Plan:  64 y.o. male with h/o CAD s/p multiple PCI's of the LAD, D1, LCX, OM2, and OM3 with EF of 15% by LV gram on 01/07/2015.He also has a h/o ischemic cardiomyopathy and chronic combined CHF with an.EF on echo in May 2016, was found to be 20-25% with right sided pressures found to be 70 mm Hg, CKD stage III with a baseline SCr of approximately 1.4 to 1.5, HTN, HLD, and DM2 who presented to [facility] hypotensive and in acute on chronic CKD stage III secondary to dehydration likely in the setting of increased diuresis and changed lifestyle.   1. Ischemic cardiomyopathy/chronic combined CHF: -Patient presented dehydrated with SCr 1.9, baseline approximately 1.4 to 1.5, likely in the setting of increased diuresis  and changed lifestyle as he is now fluid restricting and eating better monitoring his salt intake  -He was initially discharged on torsemide 20 mg bid, though on 6/14 he felt like he was beginning to get dehydrated on this and was changed to a sliding scale torsemide per HPI -He was doing well up until 6/29 when he got lightheaded in the shower and had a near-syncopal event -Weight has remained around 142 to 143 since discharge with improved breathing -Patient expresses concerns about going back to torsemide 10 mg daily -Would not want to regress on improvement made during last admission with over 8L diuresed as on this dosage he ended up quite volume overloaded. Though, he is now on spironolactone 25 mg daily so that may aid in extra diuresis should he ultimately go back on torsemide 10 mg down the road  -For now could continue torsemide 20 mg  daily. If weight dips down below baseline weight could decrease to 1/2 dose of torsemide. Continue remaining sliding scale dosing as outlined by Dr. Mariah Milling previously and in HPI.  -Losartan has been stopped -Continue Coreg 12.5 mg bid -If blood pressure remains stable in follow up could possibly consider Entresto at a later date vs starting low dose Entresto prior discharge given his cardiomyopathy   2. CAD: -Status post cardiac cath 6/7  -Severe ostial diag dzs with 60% LAD dzs @ bifurcation. Dr. Kirke Corin to consider flow wire guided PCI at later date. No chest pain. Cont asa, statin, bb, plavix.  3. HTN: -Hypotensive at home prior to presentation  -Status post rehydration  -Improved -Losartan has been discontinued -Continue Coreg 12.5 mg bid and Imdur 60 mg (if needed for BP could decrease Imdur)  4. Superficial thrombophlebitis: -[place] negative for DVT -No signs of infection  -Warm compresses -Advised patient this will take time to resolve, approximately 6-8 weeks  5. DM2: -Per IM  6. Hypokalemia: -On spironolactone and KCl   Signed, Eula Listen, PA-C Pager: [phone] 01/29/2015, 10:43 AM    Attending Note Patient seen and examined, agree with detailed note above,  Patient presentation and plan discussed on rounds.   Patient reports he has been very strict with his fluid intake, Aggressive with his weight He reports weight down to 144 pounds at home. This is below his previous baseline Creatinine up to 1.9 consistent with dehydration. Recommended he hold torsemide for weight less than 146 pounds -Losartan could be held and entresto 25/25 mg dose started twice a day Alternatively this could be done in the office -Small region of phlebitis the from previous IV. Recommended warm compresses   Signed: Dossie Arbour  M.D., Ph.D.

## 2015-01-29 NOTE — Care Management Note (Signed)
Case Management Note  Patient Details  Name: Kyle Morton MRN: [id number] Date of Birth: 08/20/1950  Subjective/Objective:        Pt. Refused to sign OBSV note. States he never has had to before, and states he has been in OBSV status. He states the [facility] is his insurance coverage for everything.            Action/Plan:had form co-signed by CNA in room.   Expected Discharge Date:                  Expected Discharge Plan:     In-House Referral:     Discharge planning Services     Post Acute Care Choice:    Choice offered to:     DME Arranged:    DME Agency:     HH Arranged:    HH Agency:     Status of Service:     Medicare Important Message Given:    Date Medicare IM Given:    Medicare IM give by:    Date Additional Medicare IM Given:    Additional Medicare Important Message give by:     If discussed at Long Length of Stay Meetings, dates discussed:    Additional Comments:  Berna BueCheryl Zanaria Morell, RN 01/29/2015, 10:12 AM

## 2015-01-31 ENCOUNTER — Ambulatory Visit: Payer: Non-veteran care

## 2015-02-07 ENCOUNTER — Encounter: Payer: Self-pay | Admitting: Cardiovascular Disease

## 2015-02-07 ENCOUNTER — Ambulatory Visit (INDEPENDENT_AMBULATORY_CARE_PROVIDER_SITE_OTHER): Payer: Non-veteran care | Admitting: Cardiovascular Disease

## 2015-02-07 VITALS — BP 128/70 | HR 83 | Ht 66.0 in | Wt 152.8 lb

## 2015-02-07 DIAGNOSIS — I2510 Atherosclerotic heart disease of native coronary artery without angina pectoris: Secondary | ICD-10-CM

## 2015-02-07 DIAGNOSIS — R0602 Shortness of breath: Secondary | ICD-10-CM | POA: Diagnosis not present

## 2015-02-07 DIAGNOSIS — I5023 Acute on chronic systolic (congestive) heart failure: Secondary | ICD-10-CM | POA: Diagnosis not present

## 2015-02-07 DIAGNOSIS — I255 Ischemic cardiomyopathy: Secondary | ICD-10-CM | POA: Diagnosis not present

## 2015-02-07 DIAGNOSIS — N179 Acute kidney failure, unspecified: Secondary | ICD-10-CM

## 2015-02-07 DIAGNOSIS — Z9861 Coronary angioplasty status: Secondary | ICD-10-CM

## 2015-02-07 NOTE — Assessment & Plan Note (Signed)
We will hold his losartan and start entresto 25/25 milligrams by mouth twice a day In one month we will evaluate his blood pressure, repeat BMP and consider increasing up to 49/51 mg twice a day

## 2015-02-07 NOTE — Assessment & Plan Note (Signed)
Recent episode of acute renal failure in the setting of over diuresis Repeat BMP today. Creatinine improved from 1.9 down to 1.5 in the hospital

## 2015-02-07 NOTE — Patient Instructions (Signed)
You are doing well.  Hold the losartan Start entresto 25/25 mg twice a day  We will check labs today  For weight < 146, hold the torsemide  Please call us if you have new issues that need to be addressed before your next appt.  Your physician wants you to follow-up in: 1 month.

## 2015-02-07 NOTE — Progress Notes (Signed)
Patient ID: Kyle MilroyWillie Morton, male    DOB: 07/30/1951, 64 y.o.   MRN: [id number]  HPI Comments: Kyle Morton is a very pleasant 64 year old gentleman with a history of coronary artery disease, xience stent placed to his diagonal #1 in 2007, 2.5 x 15 mm stent placed to his OM 3 in 2013, with 3 stents placed in 2015 previously  seen for shortness of breath, chronic systolic CHF. Baseline ejection fraction  previously 30-35%, repeat echocardiogram recently showing ejection fraction 15%  Catheterization by Dr. Kirke Morton in June 2016 showing occluded RCA, 60% proximal LAD disease, 90% ostial diagonal disease, no intervention done at that time secondary to the difficulty of the lesion and acute on chronic systolic CHF. He underwent aggressive diuresis with improvement of his shortness of breath symptoms Presenting to the hospital last week 01/28/2015 with lightheadedness, found to be dehydrated with creatinine 1.9. He had been very strict with his by mouth intake, taking torsemide 20 mg daily with spironolactone. Potassium was 3.2. He presents after recent discharge from the hospital  In follow-up he reports that he feels well. Weight has been 148 pounds at home. Does initially 146 pounds when he arrived home from the hospital. He's been taking torsemide 10 mg daily with Aldactone, twice per week he is taking torsemide 20 mg. He continues to limit his fluid intake. Reports that he is scheduled [facility] with multiple visits at the end of July to August. Currently is on aspirin and Plavix. He denies any significant chest pain concerning for angina  EKG on today's visit shows normal sinus rhythm with rates 83 bpm, ST and T wave abnormality in the anterolateral leads, old anterior MI, left anterior fascicular block  Other past medical history On his initial visit, he was having fatigue, shortness of breath, cough with sputum. Symptoms got worse in December and January 2016. He  was started on antibiotics with some improvement of his symptoms. Also given prednisone, albuterol inhaler, medications for his sinuses, Mucinex. He continues to have tightness in his chest, shortness of breath, occasional cough.       No Known Allergies  Outpatient Encounter Prescriptions as of 02/07/2015  Medication Sig  . allopurinol (ZYLOPRIM) 100 MG tablet Take 200 mg by mouth daily.   Marland Kitchen. aspirin EC 81 MG tablet Take 81 mg by mouth daily.  Marland Kitchen. atorvastatin (LIPITOR) 80 MG tablet Take 80 mg by mouth daily.  . carvedilol (COREG) 12.5 MG tablet Take 12.5 mg by mouth 2 (two) times daily with a meal.  . cetirizine (ZYRTEC) 10 MG tablet Take 10 mg by mouth daily.  . clopidogrel (PLAVIX) 75 MG tablet Take 75 mg by mouth daily.  . colchicine 0.6 MG tablet Take 0.6 mg by mouth as needed.   Marland Kitchen. dextrose (GLUTOSE) 40 % GEL Take 1 Tube by mouth once as needed for low blood sugar.  . fluticasone (FLONASE) 50 MCG/ACT nasal spray Place 2 sprays into both nostrils daily as needed for allergies or rhinitis.   Marland Kitchen. Hexylresorcinol (THROAT LOZENGES MT) Use as directed 1 Dose in the mouth or throat every 4 (four) hours as needed (for sore throat).  . insulin glargine (LANTUS) 100 UNIT/ML injection Inject 25 Units into the skin at bedtime.  . isosorbide mononitrate (IMDUR) 60 MG 24 hr tablet Take 0.5 tablets (30 mg total) by mouth daily.  . Menthol-Methyl Salicylate (THERA-GESIC EX) Apply 1 Dose topically 2 (two) times daily.   . nitroGLYCERIN (NITROSTAT)  0.4 MG SL tablet Place 1 tablet (0.4 mg total) under the tongue every 5 (five) minutes as needed for chest pain.  . potassium chloride SA (K-DUR,KLOR-CON) 20 MEQ tablet Take 1 tablet (20 mEq total) by mouth 2 (two) times daily.  . sodium fluoride (LURIDE) 1.1 (0.5 F) MG/ML SOLN Take 1 drop by mouth daily as needed.  Marland Kitchen. spironolactone (ALDACTONE) 25 MG tablet Take 25 mg by mouth daily.   Marland Kitchen. torsemide (DEMADEX) 20 MG tablet Take 1 tablet (20 mg total) by mouth daily.  Patient will take this medication according to his weight, he already has plan for medication dosing given by Dr Kyle Morton  . traZODone (DESYREL) 100 MG tablet Take 100 mg by mouth at bedtime.  Marland Kitchen. venlafaxine XR (EFFEXOR-XR) 150 MG 24 hr capsule Take 150 mg by mouth daily with breakfast.  . Vitamin D, Cholecalciferol, 1000 UNITS CAPS Take 2,000 Units by mouth daily.   No facility-administered encounter medications on file as of 02/07/2015.    Past Medical History  Diagnosis Date  . Pleural effusion   . Chronic combined systolic and diastolic CHF (congestive heart failure)     a. reported baseline EF 30-35%; b. echo 12/2014: EF 20-25%, diffuse HK, restrictive filling pattern, severely dilated LA, mod MR/TR/PR, PASP 70 mm Hg, IVC dilted c/w elevated CVP  . Coronary artery disease     a. xience stent to D1 in 2007; 2.5 x 15 mm stent placed to OM3 in 2013, w/ 3 stents placed in 2015 (details not available);  b. 01/2015 Cath: LM nl, LAD 60p, 6780m ISR, D1 95ost, 20 ISR, RI min irregs, LCX 20 ISR, OM1 small, OM2 30 ISR, OM3 40 ISR, RCA 100p, EF 15%, 2+ MR, elev R heart pressures.  . Benign essential HTN   . Hyperlipidemia   . DM2 (diabetes mellitus, type 2)   . PVC's (premature ventricular contractions)   . Vitamin D deficiency   . GERD (gastroesophageal reflux disease)   . Carotid artery stenosis     a. carotid dopplers 12/2014: less than 39% ICA bilaterally, f/u 1 year  . Depression   . Gout     feet   . CKD (chronic kidney disease), stage III     Past Surgical History  Procedure Laterality Date  . Cardiac catheterization  2013,2007  . Coronary angioplasty with stent placement  06/05/2012    OM3  . Coronary angioplasty with stent placement  04/25/2006    RAD  . Cardiac catheterization N/A 01/07/2015    Procedure: Right and Left Heart Cath;  Surgeon: Kyle OuchMuhammad A Arida, MD;  Location: [facility] INVASIVE CV LAB;  Service: Cardiovascular;  Laterality: N/A;    Social History  reports that he has never  smoked. He has never used smokeless tobacco. He reports that he does not drink alcohol or use illicit drugs.  Family History family history includes Hypertension in his brother, father, and mother.   Review of Systems  Constitutional: Negative.   Respiratory: Negative.   Cardiovascular: Negative.   Gastrointestinal: Negative.   Musculoskeletal: Negative.   Skin: Positive for rash.  Neurological: Negative.   Hematological: Negative.   Psychiatric/Behavioral: Negative.   All other systems reviewed and are negative.   BP 128/70 mmHg  Pulse 83  Ht 5\' 6"  (1.676 m)  Wt 152 lb 12 oz (69.287 kg)  BMI 24.67 kg/m2  Physical Exam  Constitutional: He is oriented to person, place, and time. He appears well-developed and well-nourished.  HENT:  Head: Normocephalic.  Nose: Nose normal.  Mouth/Throat: Oropharynx is clear and moist.  Eyes: Conjunctivae are normal. Pupils are equal, round, and reactive to light.  Neck: Normal range of motion. Neck supple. No JVD present.  Cardiovascular: Normal rate, regular rhythm, S1 normal, S2 normal, normal heart sounds and intact distal pulses.  Exam reveals no gallop and no friction rub.   No murmur heard. Trace edema  Pulmonary/Chest: Effort normal and breath sounds normal. No respiratory distress. He has no wheezes. He has no rales. He exhibits no tenderness.  Abdominal: Soft. Bowel sounds are normal. He exhibits no distension. There is no tenderness.  Musculoskeletal: Normal range of motion. He exhibits no edema or tenderness.  Lymphadenopathy:    He has no cervical adenopathy.  Neurological: He is alert and oriented to person, place, and time. Coordination normal.  Skin: Skin is warm and dry. No rash noted. No erythema.  Psychiatric: He has a normal mood and affect. His behavior is normal. Judgment and thought content normal.      Assessment and Plan   Nursing note and vitals reviewed.       

## 2015-02-07 NOTE — Assessment & Plan Note (Signed)
Recommended he try to keep his weight around 148 pounds (his home weight). Stay on torsemide 10 mg daily with Aldactone. Repeat BMP today to check potassium He is current taking potassium 20 mEq daily

## 2015-02-07 NOTE — Assessment & Plan Note (Signed)
Case was discussed with Dr. Kirke CorinArida today.  [facility]plan[facility] his [facility] can be delayed for one year or whether the appointments can be moved up to facilitate having his catheterization. This scheduling  for possible intervention on the proximal LAD and ostial diagonal stenosis. She will call us after he talks to the [facility]

## 2015-02-08 LAB — BASIC METABOLIC PANEL
BUN/Creatinine Ratio: 14 (ref 10–22)
BUN: 21 mg/dL (ref 8–27)
CALCIUM: 9.3 mg/dL (ref 8.6–10.2)
CHLORIDE: 100 mmol/L (ref 97–108)
CO2: 20 mmol/L (ref 18–29)
Creatinine, Ser: 1.53 mg/dL — ABNORMAL HIGH (ref 0.76–1.27)
GFR calc Af Amer: 54 mL/min/{1.73_m2} — ABNORMAL LOW (ref >59)
GFR calc non Af Amer: 47 mL/min/{1.73_m2} — ABNORMAL LOW (ref >59)
GLUCOSE: 245 mg/dL — AB (ref 65–99)
POTASSIUM: 4.9 mmol/L (ref 3.5–5.2)
Sodium: 137 mmol/L (ref 134–144)

## 2015-02-10 ENCOUNTER — Other Ambulatory Visit: Payer: Self-pay

## 2015-02-10 DIAGNOSIS — R931 Abnormal findings on diagnostic imaging of heart and coronary circulation: Secondary | ICD-10-CM

## 2015-02-10 DIAGNOSIS — R0602 Shortness of breath: Secondary | ICD-10-CM

## 2015-02-10 DIAGNOSIS — I5022 Chronic systolic (congestive) heart failure: Secondary | ICD-10-CM

## 2015-02-10 DIAGNOSIS — R0989 Other specified symptoms and signs involving the circulatory and respiratory systems: Secondary | ICD-10-CM

## 2015-02-14 ENCOUNTER — Other Ambulatory Visit: Payer: Self-pay

## 2015-02-14 ENCOUNTER — Telehealth: Payer: Self-pay

## 2015-02-14 DIAGNOSIS — E785 Hyperlipidemia, unspecified: Secondary | ICD-10-CM

## 2015-02-14 DIAGNOSIS — Z01812 Encounter for preprocedural laboratory examination: Secondary | ICD-10-CM

## 2015-02-14 NOTE — Telephone Encounter (Signed)
Per Dr. Kirke CorinArida, [facility]  [facility] Left message on pt VM to confirm date/time/location and needed labs. Request pt to call back to confirm receipt of message.  Will leave all paperwork at front desk

## 2015-02-14 NOTE — Telephone Encounter (Signed)
S/w pt and confirmed date/time/location of procedure.  Reviewed medications to be held. Pt states he will come by our office Monday, 7/18 to pick up paperwork and have [facility]. Verbalized understanding of all instructions with no further questions.

## 2015-02-17 ENCOUNTER — Other Ambulatory Visit
Admission: RE | Admit: 2015-02-17 | Discharge: 2015-02-17 | Disposition: A | Discharge status: Home / Self Care | Payer: Non-veteran care | Source: Ambulatory Visit | Attending: Cardiovascular Disease | Admitting: Cardiovascular Disease

## 2015-02-17 DIAGNOSIS — E785 Hyperlipidemia, unspecified: Secondary | ICD-10-CM | POA: Diagnosis present

## 2015-02-17 DIAGNOSIS — Z01812 Encounter for preprocedural laboratory examination: Secondary | ICD-10-CM | POA: Diagnosis present

## 2015-02-17 LAB — BASIC METABOLIC PANEL
Anion gap: 5 (ref 5–15)
BUN: 16 mg/dL (ref 6–20)
CALCIUM: 8.7 mg/dL — AB (ref 8.9–10.3)
CHLORIDE: 105 mmol/L (ref 101–111)
CO2: 27 mmol/L (ref 22–32)
Creatinine, Ser: 1.13 mg/dL (ref 0.61–1.24)
GFR calc Af Amer: >60 mL/min (ref >60)
GFR calc non Af Amer: >60 mL/min (ref >60)
Glucose, Bld: 137 mg/dL — ABNORMAL HIGH (ref 65–99)
Potassium: 3.3 mmol/L — ABNORMAL LOW (ref 3.5–5.1)
Sodium: 137 mmol/L (ref 135–145)

## 2015-02-17 LAB — CBC
HCT: 31.8 % — ABNORMAL LOW (ref 40.0–52.0)
Hemoglobin: 10.7 g/dL — ABNORMAL LOW (ref 13.0–18.0)
MCH: 33.3 pg (ref 26.0–34.0)
MCHC: 33.6 g/dL (ref 32.0–36.0)
MCV: 99.1 fL (ref 80.0–100.0)
PLATELETS: 187 10*3/uL (ref 150–440)
RBC: 3.21 MIL/uL — ABNORMAL LOW (ref 4.40–5.90)
RDW: 13.9 % (ref 11.5–14.5)
WBC: 4.5 10*3/uL (ref 3.8–10.6)

## 2015-02-17 LAB — PROTIME-INR
INR: 1.19
Prothrombin Time: 15.3 seconds — ABNORMAL HIGH (ref 11.4–15.0)

## 2015-02-18 ENCOUNTER — Encounter: Payer: Self-pay | Admitting: *Deleted

## 2015-02-18 ENCOUNTER — Ambulatory Visit: Payer: Non-veteran care | Admitting: Family

## 2015-02-18 DIAGNOSIS — I255 Ischemic cardiomyopathy: Secondary | ICD-10-CM

## 2015-02-18 NOTE — Progress Notes (Signed)
Cardiac Individual Treatment Plan  Patient Details  Name: Kyle Morton MRN: [id number] Date of Birth: 09/27/1950 Referring Provider:  Antonieta IbaGollan, Timothy J, MD  Initial Encounter Date:    Visit Diagnosis: Ischemic cardiomyopathy  Patient's Home Medications on Admission:  Current outpatient prescriptions:  .  allopurinol (ZYLOPRIM) 100 MG tablet, Take 200 mg by mouth daily. , Disp: , Rfl:  .  aspirin EC 81 MG tablet, Take 81 mg by mouth daily., Disp: , Rfl:  .  atorvastatin (LIPITOR) 80 MG tablet, Take 80 mg by mouth daily., Disp: , Rfl:  .  carvedilol (COREG) 12.5 MG tablet, Take 12.5 mg by mouth 2 (two) times daily with a meal., Disp: , Rfl:  .  cetirizine (ZYRTEC) 10 MG tablet, Take 10 mg by mouth daily., Disp: , Rfl:  .  clopidogrel (PLAVIX) 75 MG tablet, Take 75 mg by mouth daily., Disp: , Rfl:  .  colchicine 0.6 MG tablet, Take 0.6 mg by mouth as needed. , Disp: , Rfl:  .  dextrose (GLUTOSE) 40 % GEL, Take 1 Tube by mouth once as needed for low blood sugar., Disp: , Rfl:  .  fluticasone (FLONASE) 50 MCG/ACT nasal spray, Place 2 sprays into both nostrils daily as needed for allergies or rhinitis. , Disp: , Rfl:  .  Hexylresorcinol (THROAT LOZENGES MT), Use as directed 1 Dose in the mouth or throat every 4 (four) hours as needed (for sore throat)., Disp: , Rfl:  .  insulin glargine (LANTUS) 100 UNIT/ML injection, Inject 25 Units into the skin at bedtime., Disp: , Rfl:  .  isosorbide mononitrate (IMDUR) 60 MG 24 hr tablet, Take 0.5 tablets (30 mg total) by mouth daily., Disp: , Rfl:  .  Menthol-Methyl Salicylate (THERA-GESIC EX), Apply 1 Dose topically 2 (two) times daily. , Disp: , Rfl:  .  nitroGLYCERIN (NITROSTAT) 0.4 MG SL tablet, Place 1 tablet (0.4 mg total) under the tongue every 5 (five) minutes as needed for chest pain., Disp: 25 tablet, Rfl: 3 .  potassium chloride SA (K-DUR,KLOR-CON) 20 MEQ tablet, Take 1 tablet (20 mEq total) by mouth 2 (two) times daily., Disp: 60 tablet, Rfl:  6 .  sodium fluoride (LURIDE) 1.1 (0.5 F) MG/ML SOLN, Take 1 drop by mouth daily as needed., Disp: , Rfl:  .  spironolactone (ALDACTONE) 25 MG tablet, Take 25 mg by mouth daily. , Disp: , Rfl:  .  torsemide (DEMADEX) 20 MG tablet, Take 1 tablet (20 mg total) by mouth daily. Patient will take this medication according to his weight, he already has plan for medication dosing given by Dr Mariah MillingGollan, Disp: 180 tablet, Rfl: 3 .  traZODone (DESYREL) 100 MG tablet, Take 100 mg by mouth at bedtime., Disp: , Rfl:  .  venlafaxine XR (EFFEXOR-XR) 150 MG 24 hr capsule, Take 150 mg by mouth daily with breakfast., Disp: , Rfl:  .  Vitamin D, Cholecalciferol, 1000 UNITS CAPS, Take 2,000 Units by mouth daily., Disp: , Rfl:   Past Medical History: Past Medical History  Diagnosis Date  . Pleural effusion   . Chronic combined systolic and diastolic CHF (congestive heart failure)     a. reported baseline EF 30-35%; b. echo 12/2014: EF 20-25%, diffuse HK, restrictive filling pattern, severely dilated LA, mod MR/TR/PR, PASP 70 mm Hg, IVC dilted c/w elevated CVP  . Coronary artery disease     a. xience stent to D1 in 2007; 2.5 x 15 mm stent placed to OM3 in 2013, w/ 3 stents placed in  2015 (details not available);  b. 01/2015 Cath: LM nl, LAD 60p, 18m ISR, D1 95ost, 20 ISR, RI min irregs, LCX 20 ISR, OM1 small, OM2 30 ISR, OM3 40 ISR, RCA 100p, EF 15%, 2+ MR, elev R heart pressures.  . Benign essential HTN   . Hyperlipidemia   . DM2 (diabetes mellitus, type 2)   . PVC's (premature ventricular contractions)   . Vitamin D deficiency   . GERD (gastroesophageal reflux disease)   . Carotid artery stenosis     a. carotid dopplers 12/2014: less than 39% ICA bilaterally, f/u 1 year  . Depression   . Gout     feet   . CKD (chronic kidney disease), stage III     Tobacco Use: History  Smoking status  . Never Smoker   Smokeless tobacco  . Never Used    Labs: Recent Review Flowsheet Data    Labs for ITP Cardiac and  Pulmonary Rehab Latest Ref Rng 01/07/2015   Hemoglobin A1c 4.0 - 6.0 % 6.7(H)       Exercise Target Goals:    Exercise Program Goal: Individual exercise prescription set with THRR, safety & activity barriers. Participant demonstrates ability to understand and report RPE using BORG scale, to self-measure pulse accurately, and to acknowledge the importance of the exercise prescription.  Exercise Prescription Goal: Starting with aerobic activity 30 plus minutes a day, 3 days per week for initial exercise prescription. Provide home exercise prescription and guidelines that participant acknowledges understanding prior to discharge.  Activity Barriers & Risk Stratification:     Activity Barriers & Risk Stratification - 12/22/14 1301    Activity Barriers & Risk Stratification   Activity Barriers History of Falls;Assistive Device;Other (comment)   Comments Uses cane   Risk Stratification High      6 Minute Walk:   Initial Exercise Prescription:   Exercise Prescription Changes:     Exercise Prescription Changes      12/24/14 0700 01/20/15 1600         Exercise Review   Progression No  Absent since last review No  Absent since last review         Discharge Exercise Prescription (Final Exercise Prescription Changes):     Exercise Prescription Changes - 01/20/15 1600    Exercise Review   Progression No  Absent since last review      Nutrition:  Target Goals: Understanding of nutrition guidelines, daily intake of sodium 1500mg , cholesterol 200mg , calories 30% from fat and 7% or less from saturated fats, daily to have 5 or more servings of fruits and vegetables.  Biometrics:    Nutrition Therapy Plan and Nutrition Goals:   Nutrition Discharge: Rate Your Plate Scores:   Nutrition Goals Re-Evaluation:   Psychosocial: Target Goals: Acknowledge presence or absence of depression, maximize coping skills, provide positive support system. Participant is able to  verbalize types and ability to use techniques and skills needed for reducing stress and depression.  Initial Review & Psychosocial Screening:   Quality of Life Scores:   PHQ-9:     Recent Review Flowsheet Data    There is no flowsheet data to display.      Psychosocial Evaluation and Intervention:   Psychosocial Re-Evaluation:   Vocational Rehabilitation: Provide vocational rehab assistance to qualifying candidates.   Vocational Rehab Evaluation & Intervention:     Vocational Rehab - 12/22/14 1303    Initial Vocational Rehab Evaluation & Intervention   Assessment shows need for Vocational Rehabilitation No  Education: Education Goals: Education classes will be provided on a weekly basis, covering required topics. Participant will state understanding/return demonstration of topics presented.  Learning Barriers/Preferences:     Learning Barriers/Preferences - 12/22/14 1302    Learning Barriers/Preferences   Learning Barriers Exercise Concerns   Learning Preferences Group Instruction;Video;Written Material      Education Topics: General Nutrition Guidelines/Fats and Fiber: -Group instruction provided by verbal, written material, models and posters to present the general guidelines for heart healthy nutrition. Gives an explanation and review of dietary fats and fiber.   Controlling Sodium/Reading Food Labels: -Group verbal and written material supporting the discussion of sodium use in heart healthy nutrition. Review and explanation with models, verbal and written materials for utilization of the food label.   Exercise Physiology & Risk Factors: - Group verbal and written instruction with models to review the exercise physiology of the cardiovascular system and associated critical values. Details cardiovascular disease risk factors and the goals associated with each risk factor.   Aerobic Exercise & Resistance Training: - Gives group verbal and written  discussion on the health impact of inactivity. On the components of aerobic and resistive training programs and the benefits of this training and how to safely progress through these programs.   Flexibility, Balance, General Exercise Guidelines: - Provides group verbal and written instruction on the benefits of flexibility and balance training programs. Provides general exercise guidelines with specific guidelines to those with heart or lung disease. Demonstration and skill practice provided.   Stress Management: - Provides group verbal and written instruction about the health risks of elevated stress, cause of high stress, and healthy ways to reduce stress.   Depression: - Provides group verbal and written instruction on the correlation between heart/lung disease and depressed mood, treatment options, and the stigmas associated with seeking treatment.   Anatomy & Physiology of the Heart: - Group verbal and written instruction and models provide basic cardiac anatomy and physiology, with the coronary electrical and arterial systems. Review of: AMI, Angina, Valve disease, Heart Failure, Cardiac Arrhythmia, Pacemakers, and the ICD.   Cardiac Procedures: - Group verbal and written instruction and models to describe the testing methods done to diagnose heart disease. Reviews the outcomes of the test results. Describes the treatment choices: Medical Management, Angioplasty, or Coronary Bypass Surgery.   Cardiac Medications: - Group verbal and written instruction to review commonly prescribed medications for heart disease. Reviews the medication, class of the drug, and side effects. Includes the steps to properly store meds and maintain the prescription regimen.   Go Sex-Intimacy & Heart Disease, Get SMART - Goal Setting: - Group verbal and written instruction through game format to discuss heart disease and the return to sexual intimacy. Provides group verbal and written material to discuss  and apply goal setting through the application of the S.M.A.R.T. Method.   Other Matters of the Heart: - Provides group verbal, written materials and models to describe Heart Failure, Angina, Valve Disease, and Diabetes in the realm of heart disease. Includes description of the disease process and treatment options available to the cardiac patient.   Exercise & Equipment Safety: - Individual verbal instruction and demonstration of equipment use and safety with use of the equipment.   Infection Prevention: - Provides verbal and written material to individual with discussion of infection control including proper hand washing and proper equipment cleaning during exercise session.   Falls Prevention: - Provides verbal and written material to individual with discussion of falls prevention and safety.  Diabetes: - Individual verbal and written instruction to review signs/symptoms of diabetes, desired ranges of glucose level fasting, after meals and with exercise. Advice that pre and post exercise glucose checks will be done for 3 sessions at entry of program.    Knowledge Questionnaire Score:   Personal Goals and Risk Factors at Admission:   Personal Goals and Risk Factors Review:    Personal Goals Discharge:     Comments: 30 day review [place] remains out for medical concerns. Cath scheduled for 02/19/2015. Continue with ITP

## 2015-02-19 ENCOUNTER — Ambulatory Visit (HOSPITAL_COMMUNITY)
Admission: RE | Admit: 2015-02-19 | Discharge: 2015-02-20 | Disposition: A | Discharge status: Home / Self Care | Payer: Non-veteran care | Source: Ambulatory Visit | Attending: Cardiovascular Disease | Admitting: Cardiovascular Disease

## 2015-02-19 ENCOUNTER — Encounter (HOSPITAL_COMMUNITY): Payer: Self-pay | Admitting: Cardiovascular Disease

## 2015-02-19 ENCOUNTER — Other Ambulatory Visit: Payer: Self-pay | Admitting: *Deleted

## 2015-02-19 ENCOUNTER — Encounter (HOSPITAL_COMMUNITY)
Admission: RE | Disposition: A | Discharge status: Home / Self Care | Payer: Medicare Other | Source: Ambulatory Visit | Attending: Cardiovascular Disease

## 2015-02-19 DIAGNOSIS — I255 Ischemic cardiomyopathy: Secondary | ICD-10-CM | POA: Insufficient documentation

## 2015-02-19 DIAGNOSIS — Z794 Long term (current) use of insulin: Secondary | ICD-10-CM | POA: Insufficient documentation

## 2015-02-19 DIAGNOSIS — I2510 Atherosclerotic heart disease of native coronary artery without angina pectoris: Secondary | ICD-10-CM | POA: Diagnosis present

## 2015-02-19 DIAGNOSIS — M25569 Pain in unspecified knee: Secondary | ICD-10-CM

## 2015-02-19 DIAGNOSIS — Z7982 Long term (current) use of aspirin: Secondary | ICD-10-CM | POA: Insufficient documentation

## 2015-02-19 DIAGNOSIS — Z7902 Long term (current) use of antithrombotics/antiplatelets: Secondary | ICD-10-CM | POA: Insufficient documentation

## 2015-02-19 DIAGNOSIS — D649 Anemia, unspecified: Secondary | ICD-10-CM | POA: Insufficient documentation

## 2015-02-19 DIAGNOSIS — Z955 Presence of coronary angioplasty implant and graft: Secondary | ICD-10-CM

## 2015-02-19 DIAGNOSIS — E785 Hyperlipidemia, unspecified: Secondary | ICD-10-CM | POA: Insufficient documentation

## 2015-02-19 DIAGNOSIS — N183 Chronic kidney disease, stage 3 (moderate): Secondary | ICD-10-CM | POA: Insufficient documentation

## 2015-02-19 DIAGNOSIS — I25118 Atherosclerotic heart disease of native coronary artery with other forms of angina pectoris: Secondary | ICD-10-CM | POA: Diagnosis not present

## 2015-02-19 DIAGNOSIS — E119 Type 2 diabetes mellitus without complications: Secondary | ICD-10-CM | POA: Insufficient documentation

## 2015-02-19 DIAGNOSIS — I129 Hypertensive chronic kidney disease with stage 1 through stage 4 chronic kidney disease, or unspecified chronic kidney disease: Secondary | ICD-10-CM | POA: Insufficient documentation

## 2015-02-19 DIAGNOSIS — I5042 Chronic combined systolic (congestive) and diastolic (congestive) heart failure: Secondary | ICD-10-CM | POA: Insufficient documentation

## 2015-02-19 HISTORY — DX: Ischemic cardiomyopathy: I25.5

## 2015-02-19 HISTORY — PX: CORONARY STENT PLACEMENT: SHX1402

## 2015-02-19 HISTORY — DX: Abnormal electrocardiogram (ECG) (EKG): R94.31

## 2015-02-19 HISTORY — PX: CORONARY ANGIOPLASTY: SHX604

## 2015-02-19 HISTORY — PX: CARDIAC CATHETERIZATION: SHX172

## 2015-02-19 LAB — GLUCOSE, CAPILLARY
GLUCOSE-CAPILLARY: 134 mg/dL — AB (ref 65–99)
GLUCOSE-CAPILLARY: 112 mg/dL — AB (ref 65–99)
Glucose-Capillary: 112 mg/dL — ABNORMAL HIGH (ref 65–99)
Glucose-Capillary: 172 mg/dL — ABNORMAL HIGH (ref 65–99)
Glucose-Capillary: 294 mg/dL — ABNORMAL HIGH (ref 65–99)
Glucose-Capillary: 279 mg/dL — ABNORMAL HIGH (ref 65–99)

## 2015-02-19 LAB — POCT ACTIVATED CLOTTING TIME: ACTIVATED CLOTTING TIME: 436 s

## 2015-02-19 SURGERY — CORONARY STENT INTERVENTION

## 2015-02-19 MED ORDER — ADENOSINE 12 MG/4ML IV SOLN
12.0000 mL | Freq: Once | INTRAVENOUS | Status: DC
  Filled 2015-02-19: qty 12

## 2015-02-19 MED ORDER — SODIUM CHLORIDE 0.9 % IJ SOLN: 3.0000 mL | INTRAMUSCULAR | Status: DC | PRN

## 2015-02-19 MED ORDER — CLOPIDOGREL BISULFATE 75 MG PO TABS
ORAL_TABLET | ORAL | Status: DC | PRN
  Administered 2015-02-19: 300 mg via ORAL

## 2015-02-19 MED ORDER — SODIUM CHLORIDE 0.9 % IV SOLN: INTRAVENOUS | Status: AC

## 2015-02-19 MED ORDER — SODIUM CHLORIDE 0.9 % IJ SOLN: 3.0000 mL | Freq: Two times a day (BID) | INTRAMUSCULAR | Status: DC

## 2015-02-19 MED ORDER — SODIUM CHLORIDE 0.9 % IV SOLN
INTRAVENOUS | Status: DC
  Administered 2015-02-19: 07:00:00 via INTRAVENOUS

## 2015-02-19 MED ORDER — HEPARIN (PORCINE) IN NACL 2-0.9 UNIT/ML-% IJ SOLN
INTRAMUSCULAR | Status: AC
  Filled 2015-02-19: qty 1000

## 2015-02-19 MED ORDER — NITROGLYCERIN 1 MG/10 ML FOR IR/CATH LAB
INTRA_ARTERIAL | Status: DC | PRN
  Administered 2015-02-19 (×3): 200 ug via INTRACORONARY

## 2015-02-19 MED ORDER — FENTANYL CITRATE (PF) 100 MCG/2ML IJ SOLN
50.0000 ug | Freq: Once | INTRAMUSCULAR | Status: AC
  Administered 2015-02-19: 50 ug via INTRAVENOUS

## 2015-02-19 MED ORDER — CLOPIDOGREL BISULFATE 75 MG PO TABS
75.0000 mg | ORAL_TABLET | Freq: Every day | ORAL | Status: DC
  Administered 2015-02-20: 09:00:00 75 mg via ORAL
  Filled 2015-02-19: qty 1

## 2015-02-19 MED ORDER — ASPIRIN 81 MG PO CHEW: 81.0000 mg | CHEWABLE_TABLET | ORAL | Status: DC

## 2015-02-19 MED ORDER — ADENOSINE (DIAGNOSTIC) 140MCG/KG/MIN
INTRAVENOUS | Status: DC | PRN
  Administered 2015-02-19: 140 ug/kg/min via INTRAVENOUS

## 2015-02-19 MED ORDER — BIVALIRUDIN 250 MG IV SOLR
INTRAVENOUS | Status: AC
  Filled 2015-02-19: qty 250

## 2015-02-19 MED ORDER — ATORVASTATIN CALCIUM 80 MG PO TABS
80.0000 mg | ORAL_TABLET | Freq: Every day | ORAL | Status: DC
  Administered 2015-02-19 – 2015-02-20 (×2): 80 mg via ORAL
  Filled 2015-02-19 (×2): qty 1

## 2015-02-19 MED ORDER — POTASSIUM CHLORIDE CRYS ER 20 MEQ PO TBCR
20.0000 meq | EXTENDED_RELEASE_TABLET | Freq: Two times a day (BID) | ORAL | Status: DC
  Administered 2015-02-19 – 2015-02-20 (×2): 20 meq via ORAL
  Filled 2015-02-19 (×2): qty 1

## 2015-02-19 MED ORDER — LORATADINE 10 MG PO TABS
10.0000 mg | ORAL_TABLET | Freq: Every day | ORAL | Status: DC
  Administered 2015-02-19 – 2015-02-20 (×2): 10 mg via ORAL
  Filled 2015-02-19 (×2): qty 1

## 2015-02-19 MED ORDER — CARVEDILOL 12.5 MG PO TABS
12.5000 mg | ORAL_TABLET | Freq: Two times a day (BID) | ORAL | Status: DC
  Administered 2015-02-19 – 2015-02-20 (×3): 12.5 mg via ORAL
  Filled 2015-02-19 (×3): qty 1

## 2015-02-19 MED ORDER — TRAZODONE HCL 100 MG PO TABS
100.0000 mg | ORAL_TABLET | Freq: Every day | ORAL | Status: DC
  Administered 2015-02-19: 100 mg via ORAL
  Filled 2015-02-19 (×2): qty 1

## 2015-02-19 MED ORDER — VERAPAMIL HCL 2.5 MG/ML IV SOLN
INTRAVENOUS | Status: DC | PRN
  Administered 2015-02-19: 09:00:00 via INTRA_ARTERIAL

## 2015-02-19 MED ORDER — TRAMADOL HCL 50 MG PO TABS
50.0000 mg | ORAL_TABLET | ORAL | Status: AC
  Administered 2015-02-19: 50 mg via ORAL
  Filled 2015-02-19: qty 1

## 2015-02-19 MED ORDER — ALLOPURINOL 100 MG PO TABS
200.0000 mg | ORAL_TABLET | Freq: Every day | ORAL | Status: DC
  Administered 2015-02-20: 09:00:00 200 mg via ORAL
  Filled 2015-02-19: qty 2

## 2015-02-19 MED ORDER — GLUCOSE 40 % PO GEL: 1.0000 | Freq: Once | ORAL | Status: AC | PRN

## 2015-02-19 MED ORDER — ANGIOPLASTY BOOK
Freq: Once | Status: AC
  Administered 2015-02-19: 21:00:00
  Filled 2015-02-19: qty 1

## 2015-02-19 MED ORDER — TORSEMIDE 20 MG PO TABS
20.0000 mg | ORAL_TABLET | Freq: Every day | ORAL | Status: DC
  Administered 2015-02-19: 16:00:00 20 mg via ORAL
  Filled 2015-02-19 (×2): qty 1

## 2015-02-19 MED ORDER — MIDAZOLAM HCL 2 MG/2ML IJ SOLN
INTRAMUSCULAR | Status: DC | PRN
  Administered 2015-02-19: 1 mg via INTRAVENOUS

## 2015-02-19 MED ORDER — POTASSIUM CHLORIDE CRYS ER 20 MEQ PO TBCR
EXTENDED_RELEASE_TABLET | ORAL | Status: AC
  Administered 2015-02-19: 40 meq
  Filled 2015-02-19: qty 2

## 2015-02-19 MED ORDER — LIDOCAINE HCL (PF) 1 % IJ SOLN
INTRAMUSCULAR | Status: DC | PRN
  Administered 2015-02-19: 3 mL via SUBCUTANEOUS

## 2015-02-19 MED ORDER — NITROGLYCERIN 1 MG/10 ML FOR IR/CATH LAB
INTRA_ARTERIAL | Status: AC
  Filled 2015-02-19: qty 10

## 2015-02-19 MED ORDER — MIDAZOLAM HCL 2 MG/2ML IJ SOLN
INTRAMUSCULAR | Status: AC
  Filled 2015-02-19: qty 2

## 2015-02-19 MED ORDER — CLOPIDOGREL BISULFATE 300 MG PO TABS
ORAL_TABLET | ORAL | Status: AC
  Filled 2015-02-19: qty 1

## 2015-02-19 MED ORDER — BIVALIRUDIN BOLUS VIA INFUSION - CUPID
INTRAVENOUS | Status: DC | PRN
  Administered 2015-02-19: 50.7 mg via INTRAVENOUS

## 2015-02-19 MED ORDER — INSULIN GLARGINE 100 UNIT/ML ~~LOC~~ SOLN
25.0000 [IU] | Freq: Every day | SUBCUTANEOUS | Status: DC
  Administered 2015-02-19: 21:00:00 25 [IU] via SUBCUTANEOUS
  Filled 2015-02-19 (×2): qty 0.25

## 2015-02-19 MED ORDER — ASPIRIN EC 81 MG PO TBEC
81.0000 mg | DELAYED_RELEASE_TABLET | Freq: Every day | ORAL | Status: DC
  Administered 2015-02-20: 81 mg via ORAL
  Filled 2015-02-19: qty 1

## 2015-02-19 MED ORDER — ASPIRIN 81 MG PO CHEW
CHEWABLE_TABLET | ORAL | Status: AC
  Administered 2015-02-19: 81 mg
  Filled 2015-02-19: qty 1

## 2015-02-19 MED ORDER — VERAPAMIL HCL 2.5 MG/ML IV SOLN
INTRAVENOUS | Status: AC
  Filled 2015-02-19: qty 2

## 2015-02-19 MED ORDER — ISOSORBIDE MONONITRATE ER 30 MG PO TB24
30.0000 mg | ORAL_TABLET | Freq: Every day | ORAL | Status: DC
  Administered 2015-02-19 – 2015-02-20 (×2): 30 mg via ORAL
  Filled 2015-02-19 (×2): qty 1

## 2015-02-19 MED ORDER — FENTANYL CITRATE (PF) 100 MCG/2ML IJ SOLN
INTRAMUSCULAR | Status: DC | PRN
  Administered 2015-02-19: 25 ug via INTRAVENOUS

## 2015-02-19 MED ORDER — FENTANYL CITRATE (PF) 100 MCG/2ML IJ SOLN
INTRAMUSCULAR | Status: AC
  Filled 2015-02-19: qty 2

## 2015-02-19 MED ORDER — SACUBITRIL-VALSARTAN 24-26 MG PO TABS
1.0000 | ORAL_TABLET | Freq: Two times a day (BID) | ORAL | Status: DC
  Administered 2015-02-19 – 2015-02-20 (×3): 1 via ORAL
  Filled 2015-02-19 (×4): qty 1

## 2015-02-19 MED ORDER — FLUTICASONE PROPIONATE 50 MCG/ACT NA SUSP
2.0000 | Freq: Every day | NASAL | Status: DC | PRN
  Filled 2015-02-19: qty 16

## 2015-02-19 MED ORDER — NITROGLYCERIN 0.4 MG SL SUBL: 0.4000 mg | SUBLINGUAL_TABLET | SUBLINGUAL | Status: DC | PRN

## 2015-02-19 MED ORDER — ONDANSETRON HCL 4 MG/2ML IJ SOLN
4.0000 mg | Freq: Four times a day (QID) | INTRAMUSCULAR | Status: DC | PRN
  Administered 2015-02-20: 4 mg via INTRAVENOUS
  Filled 2015-02-19: qty 2

## 2015-02-19 MED ORDER — HYDRALAZINE HCL 20 MG/ML IJ SOLN
10.0000 mg | Freq: Once | INTRAMUSCULAR | Status: AC
  Administered 2015-02-19: 18:00:00 10 mg via INTRAVENOUS
  Filled 2015-02-19: qty 1

## 2015-02-19 MED ORDER — VENLAFAXINE HCL ER 150 MG PO CP24
150.0000 mg | ORAL_CAPSULE | Freq: Every day | ORAL | Status: DC
  Administered 2015-02-20: 150 mg via ORAL
  Filled 2015-02-19 (×2): qty 1

## 2015-02-19 MED ORDER — SODIUM CHLORIDE 0.9 % IV SOLN: 250.0000 mL | INTRAVENOUS | Status: DC | PRN

## 2015-02-19 MED ORDER — POTASSIUM CHLORIDE CRYS ER 20 MEQ PO TBCR: 40.0000 meq | EXTENDED_RELEASE_TABLET | Freq: Once | ORAL | Status: DC

## 2015-02-19 MED ORDER — LIDOCAINE HCL (PF) 1 % IJ SOLN
INTRAMUSCULAR | Status: AC
  Filled 2015-02-19: qty 30

## 2015-02-19 MED ORDER — SPIRONOLACTONE 25 MG PO TABS
25.0000 mg | ORAL_TABLET | Freq: Every day | ORAL | Status: DC
  Administered 2015-02-19 – 2015-02-20 (×2): 25 mg via ORAL
  Filled 2015-02-19 (×2): qty 1

## 2015-02-19 MED ORDER — SODIUM CHLORIDE 0.9 % IV SOLN
250.0000 mg | INTRAVENOUS | Status: DC | PRN
  Administered 2015-02-19: 1.75 mg/kg/h via INTRAVENOUS

## 2015-02-19 MED ORDER — ACETAMINOPHEN 325 MG PO TABS
650.0000 mg | ORAL_TABLET | ORAL | Status: DC | PRN
  Filled 2015-02-19: qty 2

## 2015-02-19 SURGICAL SUPPLY — 27 items
BALLN ANGIOSCULPT RX 3.0X10 (BALLOONS) ×3
BALLN EMERGE MR 2.5X12 (BALLOONS) ×3
BALLN EMERGE MR PUSH 1.20X20 (BALLOONS) ×3 IMPLANT
BALLN EUPHORA RX 2.5X10 (BALLOONS) ×3
BALLN ~~LOC~~ EMERGE MR 3.25X12 (BALLOONS) ×3
BALLN ~~LOC~~ EUPHORA RX 3.0X12 (BALLOONS) ×3
BALLOON ANGIOSCULPT RX 3.0X10 (BALLOONS) ×1 IMPLANT
BALLOON EMERGE MR 2.5X12 (BALLOONS) ×1 IMPLANT
BALLOON EUPHORA RX 2.5X10 (BALLOONS) ×1 IMPLANT
BALLOON ~~LOC~~ EMERGE MR 3.25X12 (BALLOONS) ×1 IMPLANT
BALLOON ~~LOC~~ EUPHORA RX 3.0X12 (BALLOONS) ×1 IMPLANT
CATH MICROCATH NAVVUS (MICROCATHETER) ×1 IMPLANT
CATH OPTITORQUE JACKY 4.0 5F (CATHETERS) ×3 IMPLANT
CATH VISTA GUIDE 6FR XBLAD3.5 (CATHETERS) ×3 IMPLANT
DEVICE RAD COMP TR BAND LRG (VASCULAR PRODUCTS) ×3 IMPLANT
GLIDESHEATH SLEND SS 6F .021 (SHEATH) ×3 IMPLANT
KIT ENCORE 26 ADVANTAGE (KITS) ×3 IMPLANT
KIT HEART LEFT (KITS) ×3 IMPLANT
MICROCATHETER NAVVUS (MICROCATHETER) ×3
PACK CARDIAC CATHETERIZATION (CUSTOM PROCEDURE TRAY) ×3 IMPLANT
STENT XIENCE ALPINE RX 3.0X15 (Permanent Stent) ×3 IMPLANT
SYR MEDRAD MARK V 150ML (SYRINGE) ×3 IMPLANT
TRANSDUCER W/STOPCOCK (MISCELLANEOUS) ×3 IMPLANT
TUBING CIL FLEX 10 FLL-RA (TUBING) ×3 IMPLANT
WIRE INTUITION PROPEL ST 180CM (WIRE) ×3 IMPLANT
WIRE RUNTHROUGH .014X180CM (WIRE) ×3 IMPLANT
WIRE SAFE-T 1.5MM-J .035X260CM (WIRE) ×3 IMPLANT

## 2015-02-19 NOTE — Progress Notes (Signed)
Family in to visit. Ate [place] sandwich.

## 2015-02-19 NOTE — H&P (View-Only) (Signed)
Patient ID: Kyle Morton, male    DOB: 07/30/1951, 64 y.o.   MRN: [id number]  HPI Comments: Mr. Kyle Morton is a very pleasant 64 year old gentleman with a history of coronary artery disease, xience stent placed to his diagonal #1 in 2007, 2.5 x 15 mm stent placed to his OM 3 in 2013, with 3 stents placed in 2015 previously  seen for shortness of breath, chronic systolic CHF. Baseline ejection fraction  previously 30-35%, repeat echocardiogram recently showing ejection fraction 15%  Catheterization by Dr. Kirke CorinArida in June 2016 showing occluded RCA, 60% proximal LAD disease, 90% ostial diagonal disease, no intervention done at that time secondary to the difficulty of the lesion and acute on chronic systolic CHF. He underwent aggressive diuresis with improvement of his shortness of breath symptoms Presenting to the hospital last week 01/28/2015 with lightheadedness, found to be dehydrated with creatinine 1.9. He had been very strict with his by mouth intake, taking torsemide 20 mg daily with spironolactone. Potassium was 3.2. He presents after recent discharge from the hospital  In follow-up he reports that he feels well. Weight has been 148 pounds at home. Does initially 146 pounds when he arrived home from the hospital. He's been taking torsemide 10 mg daily with Aldactone, twice per week he is taking torsemide 20 mg. He continues to limit his fluid intake. Reports that he is scheduled for deep cleaning of his teeth at the [facility] with multiple visits at the end of July to August. Currently is on aspirin and Plavix. He denies any significant chest pain concerning for angina  EKG on today's visit shows normal sinus rhythm with rates 83 bpm, ST and T wave abnormality in the anterolateral leads, old anterior MI, left anterior fascicular block  Other past medical history On his initial visit, he was having fatigue, shortness of breath, cough with sputum. Symptoms got worse in December and January 2016. He  was started on antibiotics with some improvement of his symptoms. Also given prednisone, albuterol inhaler, medications for his sinuses, Mucinex. He continues to have tightness in his chest, shortness of breath, occasional cough.       No Known Allergies  Outpatient Encounter Prescriptions as of 02/07/2015  Medication Sig  . allopurinol (ZYLOPRIM) 100 MG tablet Take 200 mg by mouth daily.   Marland Kitchen. aspirin EC 81 MG tablet Take 81 mg by mouth daily.  Marland Kitchen. atorvastatin (LIPITOR) 80 MG tablet Take 80 mg by mouth daily.  . carvedilol (COREG) 12.5 MG tablet Take 12.5 mg by mouth 2 (two) times daily with a meal.  . cetirizine (ZYRTEC) 10 MG tablet Take 10 mg by mouth daily.  . clopidogrel (PLAVIX) 75 MG tablet Take 75 mg by mouth daily.  . colchicine 0.6 MG tablet Take 0.6 mg by mouth as needed.   Marland Kitchen. dextrose (GLUTOSE) 40 % GEL Take 1 Tube by mouth once as needed for low blood sugar.  . fluticasone (FLONASE) 50 MCG/ACT nasal spray Place 2 sprays into both nostrils daily as needed for allergies or rhinitis.   Marland Kitchen. Hexylresorcinol (THROAT LOZENGES MT) Use as directed 1 Dose in the mouth or throat every 4 (four) hours as needed (for sore throat).  . insulin glargine (LANTUS) 100 UNIT/ML injection Inject 25 Units into the skin at bedtime.  . isosorbide mononitrate (IMDUR) 60 MG 24 hr tablet Take 0.5 tablets (30 mg total) by mouth daily.  . Menthol-Methyl Salicylate (THERA-GESIC EX) Apply 1 Dose topically 2 (two) times daily.   . nitroGLYCERIN (NITROSTAT)  0.4 MG SL tablet Place 1 tablet (0.4 mg total) under the tongue every 5 (five) minutes as needed for chest pain.  . potassium chloride SA (K-DUR,KLOR-CON) 20 MEQ tablet Take 1 tablet (20 mEq total) by mouth 2 (two) times daily.  . sodium fluoride (LURIDE) 1.1 (0.5 F) MG/ML SOLN Take 1 drop by mouth daily as needed.  Marland Kitchen. spironolactone (ALDACTONE) 25 MG tablet Take 25 mg by mouth daily.   Marland Kitchen. torsemide (DEMADEX) 20 MG tablet Take 1 tablet (20 mg total) by mouth daily.  Patient will take this medication according to his weight, he already has plan for medication dosing given by Dr Mariah MillingGollan  . traZODone (DESYREL) 100 MG tablet Take 100 mg by mouth at bedtime.  Marland Kitchen. venlafaxine XR (EFFEXOR-XR) 150 MG 24 hr capsule Take 150 mg by mouth daily with breakfast.  . Vitamin D, Cholecalciferol, 1000 UNITS CAPS Take 2,000 Units by mouth daily.   No facility-administered encounter medications on file as of 02/07/2015.    Past Medical History  Diagnosis Date  . Pleural effusion   . Chronic combined systolic and diastolic CHF (congestive heart failure)     a. reported baseline EF 30-35%; b. echo 12/2014: EF 20-25%, diffuse HK, restrictive filling pattern, severely dilated LA, mod MR/TR/PR, PASP 70 mm Hg, IVC dilted c/w elevated CVP  . Coronary artery disease     a. xience stent to D1 in 2007; 2.5 x 15 mm stent placed to OM3 in 2013, w/ 3 stents placed in 2015 (details not available);  b. 01/2015 Cath: LM nl, LAD 60p, 6780m ISR, D1 95ost, 20 ISR, RI min irregs, LCX 20 ISR, OM1 small, OM2 30 ISR, OM3 40 ISR, RCA 100p, EF 15%, 2+ MR, elev R heart pressures.  . Benign essential HTN   . Hyperlipidemia   . DM2 (diabetes mellitus, type 2)   . PVC's (premature ventricular contractions)   . Vitamin D deficiency   . GERD (gastroesophageal reflux disease)   . Carotid artery stenosis     a. carotid dopplers 12/2014: less than 39% ICA bilaterally, f/u 1 year  . Depression   . Gout     feet   . CKD (chronic kidney disease), stage III     Past Surgical History  Procedure Laterality Date  . Cardiac catheterization  2013,2007  . Coronary angioplasty with stent placement  06/05/2012    OM3  . Coronary angioplasty with stent placement  04/25/2006    RAD  . Cardiac catheterization N/A 01/07/2015    Procedure: Right and Left Heart Cath;  Surgeon: Iran OuchMuhammad A Arida, MD;  Location: [facility];  Service: Cardiovascular;  Laterality: N/A;    Social History  reports that he has never  smoked. He has never used smokeless tobacco. He reports that he does not drink alcohol or use illicit drugs.  Family History family history includes Hypertension in his brother, father, and mother.   Review of Systems  Constitutional: Negative.   Respiratory: Negative.   Cardiovascular: Negative.   Gastrointestinal: Negative.   Musculoskeletal: Negative.   Skin: Positive for rash.  Neurological: Negative.   Hematological: Negative.   Psychiatric/Behavioral: Negative.   All other systems reviewed and are negative.   BP 128/70 mmHg  Pulse 83  Ht 5\' 6"  (1.676 m)  Wt 152 lb 12 oz (69.287 kg)  BMI 24.67 kg/m2  Physical Exam  Constitutional: He is oriented to person, place, and time. He appears well-developed and well-nourished.  HENT:  Head: Normocephalic.  Nose: Nose normal.  Mouth/Throat: Oropharynx is clear and moist.  Eyes: Conjunctivae are normal. Pupils are equal, round, and reactive to light.  Neck: Normal range of motion. Neck supple. No JVD present.  Cardiovascular: Normal rate, regular rhythm, S1 normal, S2 normal, normal heart sounds and intact distal pulses.  Exam reveals no gallop and no friction rub.   No murmur heard. Trace edema  Pulmonary/Chest: Effort normal and breath sounds normal. No respiratory distress. He has no wheezes. He has no rales. He exhibits no tenderness.  Abdominal: Soft. Bowel sounds are normal. He exhibits no distension. There is no tenderness.  Musculoskeletal: Normal range of motion. He exhibits no edema or tenderness.  Lymphadenopathy:    He has no cervical adenopathy.  Neurological: He is alert and oriented to person, place, and time. Coordination normal.  Skin: Skin is warm and dry. No rash noted. No erythema.  Psychiatric: He has a normal mood and affect. His behavior is normal. Judgment and thought content normal.      Assessment and Plan   Nursing note and vitals reviewed.

## 2015-02-19 NOTE — Interval H&P Note (Signed)
History and Physical Interval Note:  02/19/2015 8:35 AM  Kyle Morton  has presented today for surgery, with the diagnosis of cp  The various methods of treatment have been discussed with the patient and family. After consideration of risks, benefits and other options for treatment, the patient has consented to  Procedure(s): Left Heart Cath and Coronary Angiography (N/A) as a surgical intervention .  The patient's history has been reviewed, patient examined, no change in status, stable for surgery.  I have reviewed the patient's chart and labs.  Questions were answered to the patient's satisfaction.     Lorine BearsMuhammad Marielle Mantione

## 2015-02-20 ENCOUNTER — Encounter (HOSPITAL_COMMUNITY): Payer: Self-pay | Admitting: Physician Assistant

## 2015-02-20 ENCOUNTER — Ambulatory Visit (HOSPITAL_COMMUNITY): Payer: Non-veteran care

## 2015-02-20 DIAGNOSIS — I25119 Atherosclerotic heart disease of native coronary artery with unspecified angina pectoris: Secondary | ICD-10-CM

## 2015-02-20 DIAGNOSIS — N183 Chronic kidney disease, stage 3 (moderate): Secondary | ICD-10-CM

## 2015-02-20 DIAGNOSIS — I255 Ischemic cardiomyopathy: Secondary | ICD-10-CM

## 2015-02-20 LAB — BASIC METABOLIC PANEL
Anion gap: 8 (ref 5–15)
BUN: 14 mg/dL (ref 6–20)
CO2: 26 mmol/L (ref 22–32)
Calcium: 9.3 mg/dL (ref 8.9–10.3)
Chloride: 104 mmol/L (ref 101–111)
Creatinine, Ser: 1.26 mg/dL — ABNORMAL HIGH (ref 0.61–1.24)
GFR, EST NON AFRICAN AMERICAN: 58 mL/min — AB (ref >60)
Glucose, Bld: 204 mg/dL — ABNORMAL HIGH (ref 65–99)
Potassium: 4 mmol/L (ref 3.5–5.1)
SODIUM: 138 mmol/L (ref 135–145)

## 2015-02-20 LAB — CBC
HCT: 31.2 % — ABNORMAL LOW (ref 39.0–52.0)
Hemoglobin: 10.3 g/dL — ABNORMAL LOW (ref 13.0–17.0)
MCH: 32.4 pg (ref 26.0–34.0)
MCHC: 33 g/dL (ref 30.0–36.0)
MCV: 98.1 fL (ref 78.0–100.0)
PLATELETS: 178 10*3/uL (ref 150–400)
RBC: 3.18 MIL/uL — AB (ref 4.22–5.81)
RDW: 13.4 % (ref 11.5–15.5)
WBC: 4.4 10*3/uL (ref 4.0–10.5)

## 2015-02-20 LAB — GLUCOSE, CAPILLARY
Glucose-Capillary: 178 mg/dL — ABNORMAL HIGH (ref 65–99)
Glucose-Capillary: 193 mg/dL — ABNORMAL HIGH (ref 65–99)

## 2015-02-20 LAB — MAGNESIUM: MAGNESIUM: 1.7 mg/dL (ref 1.7–2.4)

## 2015-02-20 MED ORDER — TORSEMIDE 10 MG PO TABS
10.0000 mg | ORAL_TABLET | Freq: Every day | ORAL | Status: DC
  Administered 2015-02-20: 10 mg via ORAL
  Filled 2015-02-20: qty 1

## 2015-02-20 MED ORDER — MAGNESIUM OXIDE 400 (241.3 MG) MG PO TABS
400.0000 mg | ORAL_TABLET | Freq: Once | ORAL | Status: AC
  Administered 2015-02-20: 400 mg via ORAL
  Filled 2015-02-20: qty 1

## 2015-02-20 MED ORDER — CLOPIDOGREL BISULFATE 75 MG PO TABS: 75.0000 mg | ORAL_TABLET | Freq: Every day | ORAL | Status: AC

## 2015-02-20 MED FILL — Heparin Sodium (Porcine) 2 Unit/ML in Sodium Chloride 0.9%: INTRAMUSCULAR | Qty: 1000 | Status: AC

## 2015-02-20 MED FILL — Clopidogrel Bisulfate Tab 300 MG (Base Equiv): ORAL | Qty: 1 | Status: AC

## 2015-02-20 NOTE — Consult Note (Signed)
Reason for Consult: Right knee acute pain Referring Physician: Jamori Biggar is an 64 y.o. male.  HPI: Patient reports a 3 day history of right anterior knee pain.  No history of trauma.  He had an episode of knee pain in the right knee a year ago which was managed conservatively with a knee brace.  Patient reports generalized weakness for which he ambulates with a walker normally.  He has had the knee buckle a few times on him in the last three days.  Past Medical History  Diagnosis Date  . Pleural effusion   . Chronic combined systolic and diastolic CHF (congestive heart failure)     a. reported baseline EF 30-35%; b. echo 12/2014: EF 20-25%, diffuse HK, restrictive filling pattern, severely dilated LA, mod MR/TR/PR, PASP 70 mm Hg, IVC dilted c/w elevated CVP. c. EF 15% by cath in 01/2015.  Marland Kitchen Coronary artery disease     a. xience stent to D1 in 2007; 2.5 x 15 mm stent to OM3 in 2013, w/ 3 stents in 2015 (details not available);  b. 01/2015 Cath: LM nl, LAD 60p, 21mISR, D1 95ost, 20 ISR, RI min irregs, LCX 20 ISR, OM1 small, OM2 30 ISR, OM3 40 ISR, RCA 100p, EF 15%, 2+ MR, elev R heart pres. c. 01/2015: s/p complex LAD diagonal bifurcation angioplasty/stenting with balloon angioplasty of the D1 & DES to prox LAD.  .Marland KitchenBenign essential HTN   . Hyperlipidemia   . DM2 (diabetes mellitus, type 2)   . PVC's (premature ventricular contractions)   . Vitamin D deficiency   . GERD (gastroesophageal reflux disease)   . Carotid artery stenosis     a. carotid dopplers 12/2014: less than 39% ICA bilaterally, f/u 1 year  . Depression   . Gout     feet   . CKD (chronic kidney disease), stage III   . QT prolongation   . Ischemic cardiomyopathy     Past Surgical History  Procedure Laterality Date  . Cardiac catheterization  2013,2007  . Coronary angioplasty with stent placement  06/05/2012    OM3  . Coronary angioplasty with stent placement  04/25/2006    RAD  . Cardiac catheterization N/A  01/07/2015    Procedure: Right and Left Heart Cath;  Surgeon: MWellington Hampshire MD;  [facility];  Service: Cardiovascular;  Laterality: N/A;  . Cardiac catheterization N/A 02/19/2015    Procedure: Coronary Stent Intervention;  Surgeon: MWellington Hampshire MD;  [facility];  Service: Cardiovascular;  Laterality: N/A;  . Coronary stent placement  02/19/2015    lad   . Coronary angioplasty  02/19/2015    Family History  Problem Relation Age of Onset  . Hypertension Mother   . Hypertension Father   . Hypertension Brother     Social History:  reports that he has never smoked. He has never used smokeless tobacco. He reports that he does not drink alcohol or use illicit drugs.  Allergies: No Known Allergies  Medications: I have reviewed the patient's current medications.  Results for orders placed or performed during the hospital encounter of 02/19/15 (from the past 48 hour(s))  Glucose, capillary     Status: Abnormal   Collection Time: 02/19/15  6:39 AM  Result Value Ref Range   Glucose-Capillary 134 (H) 65 - 99 mg/dL  POCT Activated clotting time     Status: None   Collection Time: 02/19/15  9:04 AM  Result Value Ref  Range   Activated Clotting Time 436 seconds  Glucose, capillary     Status: Abnormal   Collection Time: 02/19/15 11:15 AM  Result Value Ref Range   Glucose-Capillary 112 (H) 65 - 99 mg/dL  Glucose, capillary     Status: Abnormal   Collection Time: 02/19/15 11:15 AM  Result Value Ref Range   Glucose-Capillary 112 (H) 65 - 99 mg/dL  Glucose, capillary     Status: Abnormal   Collection Time: 02/19/15  1:43 PM  Result Value Ref Range   Glucose-Capillary 172 (H) 65 - 99 mg/dL  Glucose, capillary     Status: Abnormal   Collection Time: 02/19/15  7:23 PM  Result Value Ref Range   Glucose-Capillary 294 (H) 65 - 99 mg/dL  Glucose, capillary     Status: Abnormal   Collection Time: 02/19/15  8:23 PM  Result Value Ref Range    Glucose-Capillary 279 (H) 65 - 99 mg/dL   Comment 1 Notify RN    Comment 2 Document in Chart   Basic metabolic panel     Status: Abnormal   Collection Time: 02/20/15  2:29 AM  Result Value Ref Range   Sodium 138 135 - 145 mmol/L   Potassium 4.0 3.5 - 5.1 mmol/L   Chloride 104 101 - 111 mmol/L   CO2 26 22 - 32 mmol/L   Glucose, Bld 204 (H) 65 - 99 mg/dL   BUN 14 6 - 20 mg/dL   Creatinine, Ser 1.26 (H) 0.61 - 1.24 mg/dL   Calcium 9.3 8.9 - 10.3 mg/dL   GFR calc non Af Amer 58 (L) >60 mL/min   GFR calc Af Amer >60 >60 mL/min    Comment: (NOTE) The eGFR has been calculated using the CKD EPI equation. This calculation has not been validated in all clinical situations. eGFR's persistently <60 mL/min signify possible Chronic Kidney Disease.    Anion gap 8 5 - 15  CBC     Status: Abnormal   Collection Time: 02/20/15  2:29 AM  Result Value Ref Range   WBC 4.4 4.0 - 10.5 K/uL   RBC 3.18 (L) 4.22 - 5.81 MIL/uL   Hemoglobin 10.3 (L) 13.0 - 17.0 g/dL   HCT 31.2 (L) 39.0 - 52.0 %   MCV 98.1 78.0 - 100.0 fL   MCH 32.4 26.0 - 34.0 pg   MCHC 33.0 30.0 - 36.0 g/dL   RDW 13.4 11.5 - 15.5 %   Platelets 178 150 - 400 K/uL  Magnesium     Status: None   Collection Time: 02/20/15  2:29 AM  Result Value Ref Range   Magnesium 1.7 1.7 - 2.4 mg/dL  Glucose, capillary     Status: Abnormal   Collection Time: 02/20/15  6:48 AM  Result Value Ref Range   Glucose-Capillary 178 (H) 65 - 99 mg/dL   Comment 1 Notify RN    Comment 2 Document in Chart   Glucose, capillary     Status: Abnormal   Collection Time: 02/20/15 12:05 PM  Result Value Ref Range   Glucose-Capillary 193 (H) 65 - 99 mg/dL    Dg Knee 1-2 Views Right  02/20/2015   CLINICAL DATA:  NO INJURY .ANTERIOR AND SLIGHTLY LATERAL RIGHT KNEE PAIN FOR 3 DAYS  EXAM: RIGHT KNEE - 1-2 VIEW  COMPARISON:  None.  FINDINGS: No fracture. No bone lesion. Knee joint normally spaced and aligned. No arthropathic change. No convincing joint effusion.   Vascular calcifications are noted posteriorly.  IMPRESSION: 1.  No fracture or bone lesion. 2. No arthropathic change.   Electronically Signed   By: Lajean Manes M.D.   On: 02/20/2015 13:46    ROS Blood pressure 98/76, pulse 72, temperature 98 F (36.7 C), temperature source Oral, resp. rate 20, height 5' 6"  (1.676 m), weight 68 kg (149 lb 14.6 oz), SpO2 99 %. Physical Exam  Healthy appearing male in no apparent distress. Right knee with no swelling and ROM from 0-120 degrees pain free.  No effusion, stable to ligamentous exam, neg drawer and lachman.  Tender to palpation over the prepatellar area. Non tender over the joint line. No calf swelling, neg cords, NVI distally  Assessment/Plan: Acute right knee pain, unknown etiology. Patient has no abnormalities on his xrays and his exam other than the tenderness is normal.  I suspect this will go away with observation and supportive care.  He will use the brace he was given before for the knee and will continue to use the walker for balance and support.  If the symptoms do not resolve in two weeks then I would like to see him in the office. [phone]  Thanks for this consult!  Helena Sardo,STEVEN R 02/20/2015, 4:11 PM

## 2015-02-20 NOTE — Discharge Summary (Signed)
Discharge Summary   Patient ID: Kyle Morton MRN: [id number], DOB/AGE: 1950-09-21 64 y.o. Admit date: 02/19/2015 D/C date:     02/20/2015  Primary Care Provider: Toya Smothers, PA Primary Cardiologist: Dr. Mariah Milling  Primary Discharge Diagnoses:  1. CAD s/p multiple prior PCIs of the LAD, D1, LCx, OM2, and OM3  - this admission s/p staged complex LAD diagonal bifurcation angioplasty and stenting with balloon angioplasty of the first diagonal and drug-eluting stent placement to the proximal LAD. There was still significant residual stenosis and ostial diagonal which MD could not cross with a balloon after stent placement in the LAD. However, balloon angioplasty was performed before stent placement in the LAD.  2. CKD stage III (baseline Cr 1.4-1.5) 3. Ischemic cardiomyopathy/chronic combined CHF 4. Hypertension 5. Mild anemia 6. Mildly prolonged QT in the setting of QRS 7. H/o PVCs  PMH:  Past Medical History  Diagnosis Date  . Pleural effusion   . Chronic combined systolic and diastolic CHF (congestive heart failure)     a. reported baseline EF 30-35%; b. echo 12/2014: EF 20-25%, diffuse HK, restrictive filling pattern, severely dilated LA, mod MR/TR/PR, PASP 70 mm Hg, IVC dilted c/w elevated CVP. c. EF 15% by cath in 01/2015.  Marland Kitchen Coronary artery disease     a. xience stent to D1 in 2007; 2.5 x 15 mm stent to OM3 in 2013, w/ 3 stents in 2015 (details not available);  b. 01/2015 Cath: LM nl, LAD 60p, 53m ISR, D1 95ost, 20 ISR, RI min irregs, LCX 20 ISR, OM1 small, OM2 30 ISR, OM3 40 ISR, RCA 100p, EF 15%, 2+ MR, elev R heart pres. c. 01/2015: s/p complex LAD diagonal bifurcation angioplasty/stenting with balloon angioplasty of the D1 & DES to prox LAD.  Marland Kitchen Benign essential HTN   . Hyperlipidemia   . DM2 (diabetes mellitus, type 2)   . PVC's (premature ventricular contractions)   . Vitamin D deficiency   . GERD (gastroesophageal reflux disease)   . Carotid artery stenosis     a. carotid  dopplers 12/2014: less than 39% ICA bilaterally, f/u 1 year  . Depression   . Gout     feet   . CKD (chronic kidney disease), stage III   . QT prolongation   . Ischemic cardiomyopathy     Hospital Course: Kyle Morton is a 64 y/o M with history of CAD (s/p multiple PCI's of the LAD, D1, LCX, OM2, and OM3), ICM (EF 15% by cath in 01/2015 and 20-25% by echo), CKD stage III (baseline 1.4-1.5), PVCs, HTN, DM, GERD who presented to [facility] for planned cath. He was seen last month in clinic with progressive fatigue, dyspnea, and dizziness - all occurring with minimal activity. Due to concern for low output CHF, decision was made to pursue right and left heart cardiac catheterization. He presented to the [facility] cath lab on 6/7 and underwent diagnostic catheterization revealing patent LAD, D1, LCx, OM2, and OM3 stents with a chronic total occlusion of the RCA. The D1 had a new 95% ostial stenosis. It was felt that this would require a complex PCI at the bifurcation of the LAD and D1.Decision was made to defer PCI to a later date as he was found to be volume overloaded with low output heart failure (CO 3.14 L/min; CI 1.75 L/min/m^2) and a LVEDP of 27 mmHg. As a result, he was admitted for aggressive IV diuresis and discharged home in improved condition. He was readmitted 6/29 with lightheadedness  and near-syncope in the setting of possible dehydration with elevated Cr 1.9. Head CT showed no acute intracranial process, though there did appear to be multiple bilateral meningiomas to raise the possibility of neurofibromatosis. He also underwent upper extremity US to evaluate swelling of the left arm which was negative for left upper extremity DVT. Study did show nonocclusive superficial thrombus in area of swelling of the forearm.He was hydrated and torsemide was held temporarily then cut back to once daily dosing. It was then recommended to the patient that he hold this if weight <146lb. Losartan was changed  to [facility].   He was seen in the office 02/07/15 by Dr. Mariah Milling and had reported taking torsemide 10mg  daily except twice a week was taking 20mg  daily. Dr. Windell Hummingbird office note recommended to continue torsemide 10mg  daily and hold if weight <146lb. It was recommended he keep his weight around 148lb. He was set up for outpatient cath to address the previously reported CAD. Yesterday he underwent staged complex LAD diagonal bifurcation angioplasty and stenting with balloon angioplasty of the first diagonal and drug-eluting stent placement to the proximal LAD. There was still significant residual stenosis and ostial diagonal which MD could not cross with a balloon after stent placement in the LAD. However, balloon angioplasty was performed before stent placement in the LAD. He tolerated this procedure well. His blood pressure was quite high post-cath but eventually leveled out. Pre-cath Cr was 1.13 and post-cath Cr was 1.26. Although there was minimal rise he is still well below his baseline Cr of 1.4-1.5. Hgb dipped slightly down to 10.3, possibly procedurally related - would consider rechecking in follow-up. There was no reported bleeding. EKGs were also reviewed which showed mild QT prolongation of 507, however, occurring in the setting of baseline QRS widening of QRS . K was normal. Magnesium was 1.7 and was repleted in-house. He is not on standing mag due to renal dysfunction. Would also recommend following this as an outpatient. We recommended Mr. Meissner speak with the prescribing doctor of his trazodone to discuss cutting down the dose versus switching to an alternative, given that this is the only agent which can cause QT prolongation. Of note, most recent EF 20-25% by echo 12/2014, 15% by cath 01/07/15. Now that he is s/p revascularization will likely need echo in 90 days to determine candidacy for ICD. The cath note says 4-6 weeks although EP sometimes waits 3 months post-PCI. Will defer timing of f/u echo  to primary cardiologist in follow-up - already has appt on 03/03/15. We will continue torsemide at 10mg  daily as previously instructed by Dr. Mariah Milling, holding for weight <146lb. The patient ambulated well with cardiac rehab except for some slight knee instability. He walks with a walker and will be seen by PT prior to discharge. Dr. Eldridge Dace has seen and examined the patient today and feels he is stable for discharge.  Discharge Vitals: Blood pressure 105/71 (by cardiac rehab), pulse 72, temperature 98 F (36.7 C), temperature source Oral, resp. rate 20, height 5\' 6"  (1.676 m), weight 149 lb 14.6 oz (68 kg), SpO2 99 %.  Labs: Lab Results  Component Value Date   WBC 4.4 02/20/2015   HGB 10.3* 02/20/2015   HCT 31.2* 02/20/2015   MCV 98.1 02/20/2015   PLT 178 02/20/2015    Recent Labs Lab 02/20/15 0229  NA 138  K 4.0  CL 104  CO2 26  BUN 14  CREATININE 1.26*  CALCIUM 9.3  GLUCOSE 204*    Diagnostic  Studies/Procedures   Cardiac catheterization this admission, please see full report and above for summary.   Discharge Medications   Current Discharge Medication List    CONTINUE these medications which have CHANGED   Details  clopidogrel (PLAVIX) 75 MG tablet Take 1 tablet (75 mg total) by mouth daily. - not changed, just refilled Qty: 30 tablet, Refills: 11      CONTINUE these medications which have NOT CHANGED   Details  allopurinol (ZYLOPRIM) 100 MG tablet Take 200 mg by mouth daily.     aspirin EC 81 MG tablet Take 81 mg by mouth daily.    atorvastatin (LIPITOR) 80 MG tablet Take 80 mg by mouth daily.    carvedilol (COREG) 12.5 MG tablet Take 12.5 mg by mouth 2 (two) times daily with a meal.    cetirizine (ZYRTEC) 10 MG tablet Take 10 mg by mouth daily.    colchicine 0.6 MG tablet Take 0.6 mg by mouth as needed.     fluticasone (FLONASE) 50 MCG/ACT nasal spray Place 2 sprays into both nostrils daily as needed for allergies or rhinitis.     insulin glargine (LANTUS)  100 UNIT/ML injection Inject 25 Units into the skin at bedtime.    isosorbide mononitrate (IMDUR) 60 MG 24 hr tablet Take 0.5 tablets (30 mg total) by mouth daily.    Menthol-Methyl Salicylate (THERA-GESIC EX) Apply 1 Dose topically 2 (two) times daily.     potassium chloride SA (K-DUR,KLOR-CON) 20 MEQ tablet Take 1 tablet (20 mEq total) by mouth 2 (two) times daily.     sacubitril-valsartan (ENTRESTO) 24-26 MG Take 1 tablet by mouth 2 (two) times daily.    spironolactone (ALDACTONE) 25 MG tablet Take 25 mg by mouth daily.     torsemide (DEMADEX) 20 MG tablet Take 10 mg by mouth daily. Take  daily by mouth for now. Hold dose if weight is less than 146 pounds.    traZODone (DESYREL) 100 MG tablet Take 100 mg by mouth at bedtime.    venlafaxine XR (EFFEXOR-XR) 150 MG 24 hr capsule Take 150 mg by mouth daily with breakfast.    Vitamin D, Cholecalciferol, 1000 UNITS CAPS Take 2,000 Units by mouth daily.    dextrose (GLUTOSE) 40 % GEL Take 1 Tube by mouth once as needed for low blood sugar.    Hexylresorcinol (THROAT LOZENGES MT) Use as directed 1 Dose in the mouth or throat every 4 (four) hours as needed (for sore throat).    nitroGLYCERIN (NITROSTAT) 0.4 MG SL tablet Place 1 tablet (0.4 mg total) under the tongue every 5 (five) minutes as needed for chest pain    sodium fluoride (LURIDE) 1.1 (0.5 F) MG/ML SOLN Take 1 drop by mouth daily as needed.         Disposition   The patient will be discharged in stable condition to home. Discharge Instructions    Diet - low sodium heart healthy    Complete by:  As directed      Increase activity slowly    Complete by:  As directed   No driving for 2 days. No lifting over 5 lbs for 1 week. No sexual activity for 1 week. Keep procedure site clean & dry. If you notice increased pain, swelling, bleeding or pus, call/return!  You may shower, but no soaking baths/hot tubs/pools for 1 week.   One of your intervals on your EKG was a little  long - your "QT interval." The only medicine you are currently on that  can affect this is Trazodone. Please call the doctor that prescribed this medicine to discuss cutting down the dose or changing to an alternative medicine.              Duration of Discharge Encounter: Greater than 30 minutes including physician and PA time.  Signed, Kriste Basque, Dunn PA-C 02/20/2015, 9:00 AM   I have examined the patient and reviewed assessment and plan and discussed with patient. Agree with above as stated. DOing well post PCI. QRS mildly widened which contributes to the QT interval. F/u with cardiologist to determine whether ICD would be needed.  Plan for discharge later today if he does well with cardiac rehab. COntinue clopidogrel for a year without interruption.  F/u anemia and renal function as outpatient.  Sruti Ayllon S.

## 2015-02-20 NOTE — Progress Notes (Addendum)
PT saw patient for right knee pain and noted he has continued pain with ambulation as well as posterior joint swelling which they feel may benefit from aspiration. Patient denies having seen orthopedics in the past. Will get plain films and consult ortho. Their assessment is appreciated. Suggestions for ambulation will be important since he just had radial cath and needs to be careful about putting weight on that arm while using walker due to the cath procedure. Hold DC until we know plan. Azarian Starace PA-C

## 2015-02-20 NOTE — Evaluation (Addendum)
Physical Therapy Evaluation Patient Details Name: Kyle Morton MRN: [id number] DOB: 02-20-1951 Today's Date: 02/20/2015   History of Present Illness  Seen for cardiac cath with angioplasty; post-procedure noted to have difficulty walking due to Rt knee pain   Clinical Impression  Pt with above diagnosis. Currently with extremely point tender inferior Rt patella with slight edema and ?fluid behind patella limiting his ability to walk safely (also limited due to inability to extend wrist beyond neutral or put more than 5 lbs of pressure on Rt wrist s/p radial artery cath). Pt currently with functional limitations due to the deficits listed below (see PT Problem List).  Pt will benefit from skilled PT to increase their independence and safety with mobility to allow discharge to the venue listed below.    Note-Spoke with Ronie Spies, PA regarding pt's condition and recommendation for orthopedic consult. She agreed and plans to due xrays as well.     Follow Up Recommendations Home health PT;Supervision for mobility/OOB (depending on progress; decr pain)    Equipment Recommendations  None recommended by PT    Recommendations for Other Services Other (comment) (Orthopedic consult)     Precautions / Restrictions Precautions Precautions: Fall;Other (comment) (radial artery cath site--limit wrist extension) Restrictions Weight Bearing Restrictions: Yes RUE Weight Bearing: Touch down weight bearing (PA stated no more than 5 lbs pressure with wrist in neutral )      Mobility  Bed Mobility                  Transfers Overall transfer level: Needs assistance Equipment used: 4-wheeled walker Transfers: Sit to/from Stand Sit to Stand: Min assist         General transfer comment: limited ability to bear weight on RLE; required assist to power up using only Lt hand due to Rt radial precautions  Ambulation/Gait Ambulation/Gait assistance: Min assist Ambulation Distance (Feet): 15  Feet (seated rest, 15) Assistive device: 4-wheeled walker Gait Pattern/deviations: Step-to pattern;Step-through pattern;Decreased stride length;Antalgic Gait velocity: decr   General Gait Details: pt with significant antalgic gait with step-through; educated on step-to, however requires max cues to maintain  Stairs            Wheelchair Mobility    Modified Rankin (Stroke Patients Only)       Balance Overall balance assessment: Needs assistance         Standing balance support: Single extremity supported Standing balance-Leahy Scale: Poor                               Pertinent Vitals/Pain Pain Assessment: 0-10 Pain Score: 8  Pain Location: inferior portion Rt patella Pain Descriptors / Indicators: Sharp Pain Intervention(s): Limited activity within patient's tolerance;Monitored during session;Repositioned    Home Living Family/patient expects to be discharged to:: Private residence Living Arrangements: Alone Available Help at Discharge: Friend(s) (plans to be with pt overnight) Type of Home: Apartment (townhouse) Home Access: Level entry     Home Layout: One level Home Equipment: Environmental consultant - 2 wheels;Walker - 4 wheels;Cane - single point      Prior Function Level of Independence: Independent with assistive device(s)         Comments: using cane vs rollator due to SOB/CHF     Hand Dominance        Extremity/Trunk Assessment   Upper Extremity Assessment: Overall WFL for tasks assessed (Rt radial artery cath limitations)  Lower Extremity Assessment: RLE deficits/detail RLE Deficits / Details: noted slight edema superior and lateral to patella; extremely pain to palpation over inferior patella; no incr warmth; knee flexion to 90 with pain increasing with incr knee flexion    Cervical / Trunk Assessment: Normal  Communication   Communication: No difficulties  Cognition Arousal/Alertness: Awake/alert Behavior During  Therapy: WFL for tasks assessed/performed Overall Cognitive Status: Within Functional Limits for tasks assessed                      General Comments      Exercises        Assessment/Plan    PT Assessment Patient needs continued PT services  PT Diagnosis Difficulty walking;Acute pain   PT Problem List Decreased range of motion;Decreased activity tolerance;Decreased balance;Decreased mobility;Decreased knowledge of use of DME;Pain  PT Treatment Interventions DME instruction;Gait training;Functional mobility training;Therapeutic activities;Patient/family education   PT Goals (Current goals can be found in the Care Plan section) Acute Rehab PT Goals Patient Stated Goal: decr pain so he can walk PT Goal Formulation: With patient Time For Goal Achievement: 02/24/15 Potential to Achieve Goals: Good    Frequency Min 5X/week   Barriers to discharge        Co-evaluation               End of Session Equipment Utilized During Treatment: Gait belt Activity Tolerance: Patient limited by pain;Treatment limited secondary to medical complications (Comment) (began vomiting) Patient left: in chair;with call bell/phone within reach;with family/visitor present Nurse Communication: Mobility status;Other (comment) (vomiting)    Functional Assessment Tool Used: clinical jugement Functional Limitation: Mobility: Walking and moving around Mobility: Walking and Moving Around Current Status [phone]): At least 20 percent but less than 40 percent impaired, limited or restricted Mobility: Walking and Moving Around Goal Status [phone]): At least 1 percent but less than 20 percent impaired, limited or restricted    Time: 1226-1254 PT Time Calculation (min) (ACUTE ONLY): 28 min   Charges:   PT Evaluation $Initial PT Evaluation Tier I: 1 Procedure PT Treatments $Gait Training: 8-22 mins   PT G Codes:   PT G-Codes **NOT FOR INPATIENT CLASS** Functional Assessment Tool Used: clinical  jugement Functional Limitation: Mobility: Walking and moving around Mobility: Walking and Moving Around Current Status ([id number]): At least 20 percent but less than 40 percent impaired, limited or restricted Mobility: Walking and Moving Around Goal Status [phone]): At least 1 percent but less than 20 percent impaired, limited or restricted    Sheketa Ende 02/20/2015, 1:26 PM Pager [phone]

## 2015-02-20 NOTE — Progress Notes (Addendum)
CARDIAC REHAB PHASE I   PRE:  Rate/Rhythm: 70 SR  BP:  Sitting: 98/76        SaO2: 96 RA  MODE:  Ambulation: 480 ft   POST:  Rate/Rhythm: 75 SR  BP:  Sitting: 105/81         SaO2: 99 RA  Pt ambulated 480 ft on RA, handheld assist, rollator, mildly unsteady gait, tolerated well.  Pt c/o of mild dizziness, fatigue, denies cp, DOE, sitting rest x1. Pt did have some loss of balance r/t knee R knee pain and buckling. Pt states the pain is new and rates it as 8/10.  Pt would benefit from PT for increased strengthening. Cardiology PA notified. Completed PCI/stent/CHF education.  Reviewed anti-platelet therapy, stent card, activity restrictions, ntg, exercise, heart healthy diet, carb counting, portion control, heart failure book and zone tool, sodium and fluid restrictions, daily weights and phase 2 cardiac rehab. Pt verbalized understanding. Pt agrees to phase 2 cardiac rehab,states he was set up to being phase 2 at [facility] and was to call to follow-up with them when discharged from the hospital. Will send referral to [facility], gave pt brochure.  Pt declined CHF video states he just watched it at [facility]. Pt to recliner after walk, call bell within reach.  [phone]     Joylene Grapes, RN, BSN 02/20/2015 9:23 AM

## 2015-02-20 NOTE — Progress Notes (Signed)
Patient: Kyle Morton / Admit Date: 02/19/2015 / Date of Encounter: 02/20/2015, 7:11 AM   Subjective: No CP or SOB. Feeling well.   Objective: Telemetry: NSR occasional PVCs, rare couplets and triplets Physical Exam: Blood pressure 101/73, pulse 73, temperature 98 F (36.7 C), temperature source Oral, resp. rate 20, height 5\' 6"  (1.676 m), weight 149 lb 14.6 oz (68 kg), SpO2 99 %. General: Well developed, well nourished AAM in no acute distress. Head: Normocephalic, atraumatic, sclera non-icteric, no xanthomas, nares are without discharge. Neck: Negative for carotid bruits. JVP not elevated. Lungs: Clear bilaterally to auscultation without wheezes, rales, or rhonchi. Breathing is unlabored. Heart: RRR S1 S2 without murmurs, rubs, or gallops.  Abdomen: Soft, non-tender, non-distended with normoactive bowel sounds. No rebound/guarding. Extremities: No clubbing or cyanosis. No edema. Distal pedal pulses are 2+ and equal bilaterally. Right radial cath site without hematoma, ecchymosis. Good pulse Neuro: Alert and oriented X 3. Moves all extremities spontaneously. Psych:  Responds to questions appropriately with a normal affect.   Intake/Output Summary (Last 24 hours) at 02/20/15 0711 Last data filed at 02/20/15 0639  Gross per 24 hour  Intake    480 ml  Output   3175 ml  Net  -2695 ml    Inpatient Medications:  . adenosine  12 mL Intravenous Once  . allopurinol  200 mg Oral Daily  . aspirin EC  81 mg Oral Daily  . atorvastatin  80 mg Oral q1800  . carvedilol  12.5 mg Oral BID WC  . clopidogrel  75 mg Oral Daily  . insulin glargine  25 Units Subcutaneous QHS  . isosorbide mononitrate  30 mg Oral Daily  . loratadine  10 mg Oral Daily  . potassium chloride SA  20 mEq Oral BID  . sacubitril-valsartan  1 tablet Oral BID  . sodium chloride  3 mL Intravenous Q12H  . spironolactone  25 mg Oral Daily  . torsemide  20 mg Oral Daily  . traZODone  100 mg Oral QHS  . venlafaxine XR  150  mg Oral Q breakfast   Infusions:    Labs:  Recent Labs  02/17/15 1036 02/20/15 0229  NA 137 138  K 3.3* 4.0  CL 105 104  CO2 27 26  GLUCOSE 137* 204*  BUN 16 14  CREATININE 1.13 1.26*  CALCIUM 8.7* 9.3   No results for input(s): AST, ALT, ALKPHOS, BILITOT, PROT, ALBUMIN in the last 72 hours.  Recent Labs  02/17/15 1036 02/20/15 0229  WBC 4.5 4.4  HGB 10.7* 10.3*  HCT 31.8* 31.2*  MCV 99.1 98.1  PLT 187 178   No results for input(s): CKTOTAL, CKMB, TROPONINI in the last 72 hours. Invalid input(s): POCBNP No results for input(s): HGBA1C in the last 72 hours.   Radiology/Studies:  Ct Head Wo Contrast  01/28/2015   CLINICAL DATA:  Weakness.  Dizziness.  Fall.  EXAM: CT HEAD WITHOUT CONTRAST  TECHNIQUE: Contiguous axial images were obtained from the base of the skull through the vertex without intravenous contrast.  COMPARISON:  None.  FINDINGS: The brainstem, cerebellum, cerebral peduncles, thalamus, basal ganglia, basilar cisterns, and ventricular system appear within normal limits. Periventricular white matter and corona radiata hypodensities favor chronic ischemic microvascular white matter disease. No intracranial hemorrhage or acute CVA.  There are bilateral calcified extra-axial masses favoring meningioma is scattered adjacent to the cerebrum including along the falx.  The porous acoustic is appears somewhat wide bilaterally although I am not certain that there is necessarily a  vestibular schwannoma.  No intracranial hemorrhage, mass lesion, or acute CVA.  There is atherosclerotic calcification of the cavernous carotid arteries bilaterally.  IMPRESSION: 1. No acute intracranial findings are observed. 2. 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 Although not urgent, MRI of the brain with and without contrast would be suggested to assess for  vestibular schwannoma or other supporting evidence. 3. Periventricular white matter and corona radiata hypodensities favor chronic ischemic microvascular white matter disease.   Electronically Signed   By: Gaylyn Rong M.D.   On: 01/28/2015 18:58   US Venous Img Upper Uni Left  01/28/2015   CLINICAL DATA:  Left forearm swelling. Pain. Recent cardiac catheterization utilizing upper extremity access.  EXAM: LEFT UPPER EXTREMITY VENOUS DOPPLER ULTRASOUND  TECHNIQUE: Gray-scale sonography with graded compression, as well as color Doppler and duplex ultrasound were performed to evaluate the upper extremity deep venous system from the level of the subclavian vein and including the jugular, axillary, basilic, radial, ulnar and upper cephalic vein. Spectral Doppler was utilized to evaluate flow at rest and with distal augmentation maneuvers.  COMPARISON:  None.  FINDINGS: Contralateral Subclavian Vein: Respiratory phasicity is normal and symmetric with the symptomatic side. No evidence of thrombus. Normal compressibility.  Internal Jugular Vein: No evidence of thrombus. Normal compressibility, respiratory phasicity and response to augmentation.  Subclavian Vein: No evidence of thrombus. Normal compressibility, respiratory phasicity and response to augmentation.  Axillary Vein: No evidence of thrombus. Normal compressibility, respiratory phasicity and response to augmentation.  Cephalic Vein: No evidence of thrombus. Normal compressibility, respiratory phasicity and response to augmentation.  Basilic Vein: No evidence of thrombus. Normal compressibility, respiratory phasicity and response to augmentation.  Brachial Veins: No evidence of thrombus. Normal compressibility, respiratory phasicity and response to augmentation.  Radial Veins: No evidence of thrombus. Normal compressibility, respiratory phasicity and response to augmentation.  Ulnar Veins: No evidence of thrombus. Normal compressibility, respiratory  phasicity and response to augmentation.  Venous Reflux:  None visualized.  Other Findings: In the area swelling in the lateral forearm there is nonocclusive thrombus within a superficial vein, likely branch of the cephalic.  IMPRESSION: 1. No evidence of deep venous thrombosis. 2. Nonocclusive superficial thrombus in area of swelling in the forearm.   Electronically Signed   By: Rubye Oaks M.D.   On: 01/28/2015 19:48   Dg Chest Portable 1 View  01/28/2015   CLINICAL DATA:  64 year old male with a history of left forearm swelling.  EXAM: PORTABLE CHEST - 1 VIEW  COMPARISON:  01/02/2015  FINDINGS: Cardiomediastinal silhouette unchanged in size and contour. Atherosclerotic calcifications of the aortic arch.  No confluent airspace disease, pneumothorax, or pleural effusion.  No displaced fracture.  IMPRESSION: No radiographic evidence of acute cardiopulmonary disease.  Atherosclerosis.  Signed,  Yvone Neu. Loreta Ave, DO  [facility]  [facility]   Electronically Signed   By: Gilmer Mor D.O.   On: 01/28/2015 18:05     Assessment and Plan   1. CAD s/p multiple prior PCIs of the LAD, D1, LCx, OM2, and OM3 - this admission s/p staged complex LAD diagonal bifurcation angioplasty and stenting with balloon angioplasty of the first diagonal and drug-eluting stent placement to the proximal LAD. There was still significant residual stenosis and ostial diagonal which MD could not cross with a balloon after stent placement in the LAD. However, balloon angioplasty was performed before stent placement in  the LAD. Continue ASA, Plavix, BB, statin.  2. CKD stage III (baseline Cr 1.4-1.5) - pre cath Cr 1.13 (previously 1.5), post cath Cr 1.26. Minimal post-cath rise but generally stable compared to baseline.  3. Ischemic cardiomyopathy/chronic combined CHF - most recent EF 20-25% by echo 12/2014, 15% by cath 01/07/15. Now that he is s/p revascularization will likely need echo  in 90 days to determine candidacy for ICD. The cath note says 4-6 weeks but to meet criteria for ICD it should probably be 3 months post-PCI. Will defer timing of f/u echo to primary cardiologist in follow-up.  4. Hypertension - continue current regimen.  5. Anemia - Hgb down slightly from last admission, may be procedurally related. Can consider repeating at f/u appt.  6. Mildly prolonged QT - in the setting of QRS . K wnl. Check Mg. Will discuss further management with MD. Only offending agent here would be trazodone.  Signed, Ronie Spies PA-C Pager: [phone]   I have examined the patient and reviewed assessment and plan and discussed with patient.  Agree with above as stated.  DOing well post PCI.  QRS mildly widened which contributes to the QT interval.  F/u with cardiologist to determine whether ICD would be needed.  VARANASI,JAYADEEP S.

## 2015-03-03 ENCOUNTER — Ambulatory Visit: Payer: Non-veteran care | Admitting: Cardiovascular Disease

## 2015-03-10 ENCOUNTER — Encounter: Payer: Self-pay | Admitting: *Deleted

## 2015-03-11 ENCOUNTER — Telehealth: Payer: Self-pay | Admitting: *Deleted

## 2015-03-11 NOTE — Telephone Encounter (Signed)
Patient wants to know if he can go to cardiac rehab?

## 2015-03-11 NOTE — Telephone Encounter (Signed)
Looks like pt was referred to cardiac rehab during his last hospitalization.

## 2015-03-12 ENCOUNTER — Telehealth: Payer: Self-pay | Admitting: *Deleted

## 2015-03-12 NOTE — Telephone Encounter (Signed)
Called to talk to [facility]  to set up his class times and dates. No voicemail available.

## 2015-03-13 ENCOUNTER — Encounter: Payer: Self-pay | Admitting: *Deleted

## 2015-03-13 DIAGNOSIS — Z9861 Coronary angioplasty status: Secondary | ICD-10-CM

## 2015-03-13 DIAGNOSIS — I255 Ischemic cardiomyopathy: Secondary | ICD-10-CM

## 2015-03-13 NOTE — Progress Notes (Signed)
Cardiac Individual Treatment Plan  Patient Details  Name: Kyle Morton MRN: [id number] Date of Birth: May 02, 1951 Referring Provider:  Iran Ouch, MD  Initial Encounter Date:    Visit Diagnosis: Ischemic cardiomyopathy  S/P PTCA (percutaneous transluminal coronary angioplasty)  Patient's Home Medications on Admission:  Current outpatient prescriptions:  .  allopurinol (ZYLOPRIM) 100 MG tablet, Take 200 mg by mouth daily. , Disp: , Rfl:  .  aspirin EC 81 MG tablet, Take 81 mg by mouth daily., Disp: , Rfl:  .  atorvastatin (LIPITOR) 80 MG tablet, Take 80 mg by mouth daily., Disp: , Rfl:  .  carvedilol (COREG) 12.5 MG tablet, Take 12.5 mg by mouth 2 (two) times daily with a meal., Disp: , Rfl:  .  cetirizine (ZYRTEC) 10 MG tablet, Take 10 mg by mouth daily., Disp: , Rfl:  .  clopidogrel (PLAVIX) 75 MG tablet, Take 1 tablet (75 mg total) by mouth daily., Disp: 30 tablet, Rfl: 11 .  colchicine 0.6 MG tablet, Take 0.6 mg by mouth as needed. , Disp: , Rfl:  .  dextrose (GLUTOSE) 40 % GEL, Take 1 Tube by mouth once as needed for low blood sugar., Disp: , Rfl:  .  fluticasone (FLONASE) 50 MCG/ACT nasal spray, Place 2 sprays into both nostrils daily as needed for allergies or rhinitis. , Disp: , Rfl:  .  Hexylresorcinol (THROAT LOZENGES MT), Use as directed 1 Dose in the mouth or throat every 4 (four) hours as needed (for sore throat)., Disp: , Rfl:  .  insulin glargine (LANTUS) 100 UNIT/ML injection, Inject 25 Units into the skin at bedtime., Disp: , Rfl:  .  isosorbide mononitrate (IMDUR) 60 MG 24 hr tablet, Take 0.5 tablets (30 mg total) by mouth daily., Disp: , Rfl:  .  Menthol-Methyl Salicylate (THERA-GESIC EX), Apply 1 Dose topically 2 (two) times daily. , Disp: , Rfl:  .  nitroGLYCERIN (NITROSTAT) 0.4 MG SL tablet, Place 1 tablet (0.4 mg total) under the tongue every 5 (five) minutes as needed for chest pain., Disp: 25 tablet, Rfl: 3 .  potassium chloride SA (K-DUR,KLOR-CON) 20 MEQ  tablet, Take 1 tablet (20 mEq total) by mouth 2 (two) times daily., Disp: 60 tablet, Rfl: 6 .  sacubitril-valsartan (ENTRESTO) 24-26 MG, Take 1 tablet by mouth 2 (two) times daily., Disp: , Rfl:  .  sodium fluoride (LURIDE) 1.1 (0.5 F) MG/ML SOLN, Take 1 drop by mouth daily as needed., Disp: , Rfl:  .  spironolactone (ALDACTONE) 25 MG tablet, Take 25 mg by mouth daily. , Disp: , Rfl:  .  torsemide (DEMADEX) 20 MG tablet, Take 10 mg by mouth daily. Take  daily by mouth for now. Hold dose if weight is less than 146 pounds., Disp: , Rfl:  .  traZODone (DESYREL) 100 MG tablet, Take 100 mg by mouth at bedtime., Disp: , Rfl:  .  venlafaxine XR (EFFEXOR-XR) 150 MG 24 hr capsule, Take 150 mg by mouth daily with breakfast., Disp: , Rfl:  .  Vitamin D, Cholecalciferol, 1000 UNITS CAPS, Take 2,000 Units by mouth daily., Disp: , Rfl:   Past Medical History: Past Medical History  Diagnosis Date  . Pleural effusion   . Chronic combined systolic and diastolic CHF (congestive heart failure)     a. reported baseline EF 30-35%; b. echo 12/2014: EF 20-25%, diffuse HK, restrictive filling pattern, severely dilated LA, mod MR/TR/PR, PASP 70 mm Hg, IVC dilted c/w elevated CVP. c. EF 15% by cath in 01/2015.  Marland Kitchen  Coronary artery disease     a. xience stent to D1 in 2007; 2.5 x 15 mm stent to OM3 in 2013, w/ 3 stents in 2015 (details not available);  b. 01/2015 Cath: LM nl, LAD 60p, 20m ISR, D1 95ost, 20 ISR, RI min irregs, LCX 20 ISR, OM1 small, OM2 30 ISR, OM3 40 ISR, RCA 100p, EF 15%, 2+ MR, elev R heart pres. c. 01/2015: s/p complex LAD diagonal bifurcation angioplasty/stenting with balloon angioplasty of the D1 & DES to prox LAD.  Marland Kitchen Benign essential HTN   . Hyperlipidemia   . DM2 (diabetes mellitus, type 2)   . PVC's (premature ventricular contractions)   . Vitamin D deficiency   . GERD (gastroesophageal reflux disease)   . Carotid artery stenosis     a. carotid dopplers 12/2014: less than 39% ICA bilaterally, f/u  1 year  . Depression   . Gout     feet   . CKD (chronic kidney disease), stage III   . QT prolongation   . Ischemic cardiomyopathy     Tobacco Use: History  Smoking status  . Never Smoker   Smokeless tobacco  . Never Used    Labs: Recent Review Flowsheet Data    Labs for ITP Cardiac and Pulmonary Rehab Latest Ref Rng 01/07/2015   Hemoglobin A1c 4.0 - 6.0 % 6.7(H)       Exercise Target Goals:    Exercise Program Goal: Individual exercise prescription set with THRR, safety & activity barriers. Participant demonstrates ability to understand and report RPE using BORG scale, to self-measure pulse accurately, and to acknowledge the importance of the exercise prescription.  Exercise Prescription Goal: Starting with aerobic activity 30 plus minutes a day, 3 days per week for initial exercise prescription. Provide home exercise prescription and guidelines that participant acknowledges understanding prior to discharge.  Activity Barriers & Risk Stratification:   6 Minute Walk:   Initial Exercise Prescription:   Exercise Prescription Changes:     Exercise Prescription Changes      01/20/15 1600 03/10/15 0800         Exercise Review   Progression No  Absent since last review No  Absent since last review         Discharge Exercise Prescription (Final Exercise Prescription Changes):     Exercise Prescription Changes - 03/10/15 0800    Exercise Review   Progression No  Absent since last review      Nutrition:  Target Goals: Understanding of nutrition guidelines, daily intake of sodium 1500mg , cholesterol 200mg , calories 30% from fat and 7% or less from saturated fats, daily to have 5 or more servings of fruits and vegetables.  Biometrics:    Nutrition Therapy Plan and Nutrition Goals:   Nutrition Discharge: Rate Your Plate Scores:   Nutrition Goals Re-Evaluation:   Psychosocial: Target Goals: Acknowledge presence or absence of depression, maximize  coping skills, provide positive support system. Participant is able to verbalize types and ability to use techniques and skills needed for reducing stress and depression.  Initial Review & Psychosocial Screening:   Quality of Life Scores:   PHQ-9:     Recent Review Flowsheet Data    There is no flowsheet data to display.      Psychosocial Evaluation and Intervention:   Psychosocial Re-Evaluation:   Vocational Rehabilitation: Provide vocational rehab assistance to qualifying candidates.   Vocational Rehab Evaluation & Intervention:   Education: Education Goals: Education classes will be provided on a weekly basis, covering required  topics. Participant will state understanding/return demonstration of topics presented.  Learning Barriers/Preferences:   Education Topics: General Nutrition Guidelines/Fats and Fiber: -Group instruction provided by verbal, written material, models and posters to present the general guidelines for heart healthy nutrition. Gives an explanation and review of dietary fats and fiber.   Controlling Sodium/Reading Food Labels: -Group verbal and written material supporting the discussion of sodium use in heart healthy nutrition. Review and explanation with models, verbal and written materials for utilization of the food label.   Exercise Physiology & Risk Factors: - Group verbal and written instruction with models to review the exercise physiology of the cardiovascular system and associated critical values. Details cardiovascular disease risk factors and the goals associated with each risk factor.   Aerobic Exercise & Resistance Training: - Gives group verbal and written discussion on the health impact of inactivity. On the components of aerobic and resistive training programs and the benefits of this training and how to safely progress through these programs.   Flexibility, Balance, General Exercise Guidelines: - Provides group verbal and  written instruction on the benefits of flexibility and balance training programs. Provides general exercise guidelines with specific guidelines to those with heart or lung disease. Demonstration and skill practice provided.   Stress Management: - Provides group verbal and written instruction about the health risks of elevated stress, cause of high stress, and healthy ways to reduce stress.   Depression: - Provides group verbal and written instruction on the correlation between heart/lung disease and depressed mood, treatment options, and the stigmas associated with seeking treatment.   Anatomy & Physiology of the Heart: - Group verbal and written instruction and models provide basic cardiac anatomy and physiology, with the coronary electrical and arterial systems. Review of: AMI, Angina, Valve disease, Heart Failure, Cardiac Arrhythmia, Pacemakers, and the ICD.   Cardiac Procedures: - Group verbal and written instruction and models to describe the testing methods done to diagnose heart disease. Reviews the outcomes of the test results. Describes the treatment choices: Medical Management, Angioplasty, or Coronary Bypass Surgery.   Cardiac Medications: - Group verbal and written instruction to review commonly prescribed medications for heart disease. Reviews the medication, class of the drug, and side effects. Includes the steps to properly store meds and maintain the prescription regimen.   Go Sex-Intimacy & Heart Disease, Get SMART - Goal Setting: - Group verbal and written instruction through game format to discuss heart disease and the return to sexual intimacy. Provides group verbal and written material to discuss and apply goal setting through the application of the S.M.A.R.T. Method.   Other Matters of the Heart: - Provides group verbal, written materials and models to describe Heart Failure, Angina, Valve Disease, and Diabetes in the realm of heart disease. Includes description of  the disease process and treatment options available to the cardiac patient.   Exercise & Equipment Safety: - Individual verbal instruction and demonstration of equipment use and safety with use of the equipment.   Infection Prevention: - Provides verbal and written material to individual with discussion of infection control including proper hand washing and proper equipment cleaning during exercise session.   Falls Prevention: - Provides verbal and written material to individual with discussion of falls prevention and safety.   Diabetes: - Individual verbal and written instruction to review signs/symptoms of diabetes, desired ranges of glucose level fasting, after meals and with exercise. Advice that pre and post exercise glucose checks will be done for 3 sessions at entry of program.  Knowledge Questionnaire Score:   Personal Goals and Risk Factors at Admission:   Personal Goals and Risk Factors Review:    Personal Goals Discharge:     Comments: 30 day review Kyle has had recent PTCA. Will return to class on Tuesday August 16.

## 2015-03-18 ENCOUNTER — Encounter: Payer: Medicare Other | Attending: General Practice | Admitting: *Deleted

## 2015-03-18 DIAGNOSIS — Z7189 Other specified counseling: Secondary | ICD-10-CM | POA: Insufficient documentation

## 2015-03-18 DIAGNOSIS — Z9861 Coronary angioplasty status: Secondary | ICD-10-CM

## 2015-03-18 DIAGNOSIS — E785 Hyperlipidemia, unspecified: Secondary | ICD-10-CM | POA: Diagnosis present

## 2015-03-18 LAB — GLUCOSE, CAPILLARY: Glucose-Capillary: 124 mg/dL — ABNORMAL HIGH (ref 65–99)

## 2015-03-18 NOTE — Progress Notes (Signed)
Daily Session Note  Patient Details  Name: Kyle Morton MRN: [id number] Date of Birth: [birth year] Referring Provider:  [facility]  Encounter Date: 03/18/2015  Check In:     Session Check In - 03/18/15 1115    Check-In   Staff Present Diane Joya Gaskins RN, BSN;Other  Bronwen Betters, Exercise Specialist   ER physicians immediately available to respond to emergencies See telemetry face sheet for immediately available ER MD   Medication changes reported     Yes   Comments Patient to bring updated list    Fall or balance concerns reported    No   Warm-up and Cool-down Performed on first and last piece of equipment   VAD Patient? No   Pain Assessment   Currently in Pain? No/denies   Multiple Pain Sites No         Goals Met:  Exercise tolerated well No report of cardiac concerns or symptoms Strength training completed today  Goals Unmet:  Not Applicable  Goals Comments: Patient will switch to Monday, Wednesday, Friday class at 0800 beginning on Friday, March 22, 2015.     Dr. Emily Filbert is Medical Director for [facility].

## 2015-03-19 ENCOUNTER — Other Ambulatory Visit: Payer: Self-pay | Admitting: *Deleted

## 2015-03-19 DIAGNOSIS — Z9861 Coronary angioplasty status: Secondary | ICD-10-CM

## 2015-03-19 NOTE — Progress Notes (Signed)
Cardiac Individual Treatment Plan  Patient Details  Name: Kyle Morton MRN: [id number] Date of Birth: October 11, 1950 Referring Provider:  No ref. provider found  Initial Encounter Date:    Visit Diagnosis: No diagnosis found.  Patient's Home Medications on Admission:  Current outpatient prescriptions:  .  allopurinol (ZYLOPRIM) 100 MG tablet, Take 200 mg by mouth daily. , Disp: , Rfl:  .  aspirin EC 81 MG tablet, Take 81 mg by mouth daily., Disp: , Rfl:  .  atorvastatin (LIPITOR) 80 MG tablet, Take 80 mg by mouth daily., Disp: , Rfl:  .  carvedilol (COREG) 12.5 MG tablet, Take 12.5 mg by mouth 2 (two) times daily with a meal., Disp: , Rfl:  .  cetirizine (ZYRTEC) 10 MG tablet, Take 10 mg by mouth daily., Disp: , Rfl:  .  clopidogrel (PLAVIX) 75 MG tablet, Take 1 tablet (75 mg total) by mouth daily., Disp: 30 tablet, Rfl: 11 .  colchicine 0.6 MG tablet, Take 0.6 mg by mouth as needed. , Disp: , Rfl:  .  dextrose (GLUTOSE) 40 % GEL, Take 1 Tube by mouth once as needed for low blood sugar., Disp: , Rfl:  .  fluticasone (FLONASE) 50 MCG/ACT nasal spray, Place 2 sprays into both nostrils daily as needed for allergies or rhinitis. , Disp: , Rfl:  .  Hexylresorcinol (THROAT LOZENGES MT), Use as directed 1 Dose in the mouth or throat every 4 (four) hours as needed (for sore throat)., Disp: , Rfl:  .  insulin glargine (LANTUS) 100 UNIT/ML injection, Inject 25 Units into the skin at bedtime., Disp: , Rfl:  .  isosorbide mononitrate (IMDUR) 60 MG 24 hr tablet, Take 0.5 tablets (30 mg total) by mouth daily., Disp: , Rfl:  .  Menthol-Methyl Salicylate (THERA-GESIC EX), Apply 1 Dose topically 2 (two) times daily. , Disp: , Rfl:  .  nitroGLYCERIN (NITROSTAT) 0.4 MG SL tablet, Place 1 tablet (0.4 mg total) under the tongue every 5 (five) minutes as needed for chest pain., Disp: 25 tablet, Rfl: 3 .  potassium chloride SA (K-DUR,KLOR-CON) 20 MEQ tablet, Take 1 tablet (20 mEq total) by mouth 2 (two) times  daily., Disp: 60 tablet, Rfl: 6 .  sacubitril-valsartan (ENTRESTO) 24-26 MG, Take 1 tablet by mouth 2 (two) times daily., Disp: , Rfl:  .  sodium fluoride (LURIDE) 1.1 (0.5 F) MG/ML SOLN, Take 1 drop by mouth daily as needed., Disp: , Rfl:  .  spironolactone (ALDACTONE) 25 MG tablet, Take 25 mg by mouth daily. , Disp: , Rfl:  .  torsemide (DEMADEX) 20 MG tablet, Take 10 mg by mouth daily. Take 10mg  daily by mouth for now. Hold dose if weight is less than 146 pounds., Disp: , Rfl:  .  traZODone (DESYREL) 100 MG tablet, Take 100 mg by mouth at bedtime., Disp: , Rfl:  .  venlafaxine XR (EFFEXOR-XR) 150 MG 24 hr capsule, Take 150 mg by mouth daily with breakfast., Disp: , Rfl:  .  Vitamin D, Cholecalciferol, 1000 UNITS CAPS, Take 2,000 Units by mouth daily., Disp: , Rfl:   Past Medical History: Past Medical History  Diagnosis Date  . Pleural effusion   . Chronic combined systolic and diastolic CHF (congestive heart failure)     a. reported baseline EF 30-35%; b. echo 12/2014: EF 20-25%, diffuse HK, restrictive filling pattern, severely dilated LA, mod MR/TR/PR, PASP 70 mm Hg, IVC dilted c/w elevated CVP. c. EF 15% by cath in 01/2015.  Marland Kitchen Coronary artery disease  a. xience stent to D1 in 2007; 2.5 x 15 mm stent to OM3 in 2013, w/ 3 stents in 2015 (details not available);  b. 01/2015 Cath: LM nl, LAD 60p, 39m ISR, D1 95ost, 20 ISR, RI min irregs, LCX 20 ISR, OM1 small, OM2 30 ISR, OM3 40 ISR, RCA 100p, EF 15%, 2+ MR, elev R heart pres. c. 01/2015: s/p complex LAD diagonal bifurcation angioplasty/stenting with balloon angioplasty of the D1 & DES to prox LAD.  Marland Kitchen Benign essential HTN   . Hyperlipidemia   . DM2 (diabetes mellitus, type 2)   . PVC's (premature ventricular contractions)   . Vitamin D deficiency   . GERD (gastroesophageal reflux disease)   . Carotid artery stenosis     a. carotid dopplers 12/2014: less than 39% ICA bilaterally, f/u 1 year  . Depression   . Gout     feet   . CKD (chronic  kidney disease), stage III   . QT prolongation   . Ischemic cardiomyopathy     Tobacco Use: History  Smoking status  . Never Smoker   Smokeless tobacco  . Never Used    Labs: Recent Review Flowsheet Data    Labs for ITP Cardiac and Pulmonary Rehab Latest Ref Rng 01/07/2015   Hemoglobin A1c 4.0 - 6.0 % 6.7(H)       Exercise Target Goals:    Exercise Program Goal: Individual exercise prescription set with THRR, safety & activity barriers. Participant demonstrates ability to understand and report RPE using BORG scale, to self-measure pulse accurately, and to acknowledge the importance of the exercise prescription.  Exercise Prescription Goal: Starting with aerobic activity 30 plus minutes a day, 3 days per week for initial exercise prescription. Provide home exercise prescription and guidelines that participant acknowledges understanding prior to discharge.  Activity Barriers & Risk Stratification:   6 Minute Walk:   Initial Exercise Prescription:   Exercise Prescription Changes:     Exercise Prescription Changes      01/20/15 1600 03/10/15 0800         Exercise Review   Progression No  Absent since last review No  Absent since last review         Discharge Exercise Prescription (Final Exercise Prescription Changes):     Exercise Prescription Changes - 03/10/15 0800    Exercise Review   Progression No  Absent since last review      Nutrition:  Target Goals: Understanding of nutrition guidelines, daily intake of sodium 1500mg , cholesterol 200mg , calories 30% from fat and 7% or less from saturated fats, daily to have 5 or more servings of fruits and vegetables.  Biometrics:    Nutrition Therapy Plan and Nutrition Goals:   Nutrition Discharge: Rate Your Plate Scores:   Nutrition Goals Re-Evaluation:   Psychosocial: Target Goals: Acknowledge presence or absence of depression, maximize coping skills, provide positive support system. Participant  is able to verbalize types and ability to use techniques and skills needed for reducing stress and depression.  Initial Review & Psychosocial Screening:   Quality of Life Scores:   PHQ-9:     Recent Review Flowsheet Data    There is no flowsheet data to display.      Psychosocial Evaluation and Intervention:   Psychosocial Re-Evaluation:   Vocational Rehabilitation: Provide vocational rehab assistance to qualifying candidates.   Vocational Rehab Evaluation & Intervention:   Education: Education Goals: Education classes will be provided on a weekly basis, covering required topics. Participant will state understanding/return demonstration of  topics presented.  Learning Barriers/Preferences:   Education Topics: General Nutrition Guidelines/Fats and Fiber: -Group instruction provided by verbal, written material, models and posters to present the general guidelines for heart healthy nutrition. Gives an explanation and review of dietary fats and fiber.   Controlling Sodium/Reading Food Labels: -Group verbal and written material supporting the discussion of sodium use in heart healthy nutrition. Review and explanation with models, verbal and written materials for utilization of the food label.   Exercise Physiology & Risk Factors: - Group verbal and written instruction with models to review the exercise physiology of the cardiovascular system and associated critical values. Details cardiovascular disease risk factors and the goals associated with each risk factor.   Aerobic Exercise & Resistance Training: - Gives group verbal and written discussion on the health impact of inactivity. On the components of aerobic and resistive training programs and the benefits of this training and how to safely progress through these programs.   Flexibility, Balance, General Exercise Guidelines: - Provides group verbal and written instruction on the benefits of flexibility and balance  training programs. Provides general exercise guidelines with specific guidelines to those with heart or lung disease. Demonstration and skill practice provided.   Stress Management: - Provides group verbal and written instruction about the health risks of elevated stress, cause of high stress, and healthy ways to reduce stress.   Depression: - Provides group verbal and written instruction on the correlation between heart/lung disease and depressed mood, treatment options, and the stigmas associated with seeking treatment.   Anatomy & Physiology of the Heart: - Group verbal and written instruction and models provide basic cardiac anatomy and physiology, with the coronary electrical and arterial systems. Review of: AMI, Angina, Valve disease, Heart Failure, Cardiac Arrhythmia, Pacemakers, and the ICD.   Cardiac Procedures: - Group verbal and written instruction and models to describe the testing methods done to diagnose heart disease. Reviews the outcomes of the test results. Describes the treatment choices: Medical Management, Angioplasty, or Coronary Bypass Surgery.   Cardiac Medications: - Group verbal and written instruction to review commonly prescribed medications for heart disease. Reviews the medication, class of the drug, and side effects. Includes the steps to properly store meds and maintain the prescription regimen.   Go Sex-Intimacy & Heart Disease, Get SMART - Goal Setting: - Group verbal and written instruction through game format to discuss heart disease and the return to sexual intimacy. Provides group verbal and written material to discuss and apply goal setting through the application of the S.M.A.R.T. Method.   Other Matters of the Heart: - Provides group verbal, written materials and models to describe Heart Failure, Angina, Valve Disease, and Diabetes in the realm of heart disease. Includes description of the disease process and treatment options available to the  cardiac patient.   Exercise & Equipment Safety: - Individual verbal instruction and demonstration of equipment use and safety with use of the equipment.   Infection Prevention: - Provides verbal and written material to individual with discussion of infection control including proper hand washing and proper equipment cleaning during exercise session.   Falls Prevention: - Provides verbal and written material to individual with discussion of falls prevention and safety.   Diabetes: - Individual verbal and written instruction to review signs/symptoms of diabetes, desired ranges of glucose level fasting, after meals and with exercise. Advice that pre and post exercise glucose checks will be done for 3 sessions at entry of program.    Knowledge Questionnaire Score:  Personal Goals and Risk Factors at Admission:   Personal Goals and Risk Factors Review:    Personal Goals Discharge:     Comments: Has been finally able to return to Cardiac Rehab. 30 day review.

## 2015-03-21 ENCOUNTER — Encounter: Payer: Medicare Other | Admitting: *Deleted

## 2015-03-21 DIAGNOSIS — Z7189 Other specified counseling: Secondary | ICD-10-CM | POA: Diagnosis not present

## 2015-03-21 DIAGNOSIS — Z9861 Coronary angioplasty status: Secondary | ICD-10-CM

## 2015-03-21 NOTE — Progress Notes (Signed)
Daily Session Note  Patient Details  Name: Abdikadir Fohl MRN: [id number] Date of Birth: 1951-06-19 Referring Provider:  [facility]  Encounter Date: 03/21/2015  Check In:     Session Check In - 03/21/15 0630    Check-In   Staff Present Nyoka Cowden RN;Saturnino Liew RN, BSN  Hessie Knows,    ER physicians immediately available to respond to emergencies See telemetry face sheet for immediately available ER MD   Medication changes reported     No   Fall or balance concerns reported    No   Warm-up and Cool-down Performed on first and last piece of equipment   VAD Patient? No   Pain Assessment   Currently in Pain? No/denies         Goals Met:  Proper associated with RPD/PD & O2 Sat  Goals Unmet:  Not Applicable  Goals Comments: Doing well on nustep today.    Dr. Emily Filbert is Medical Director for [place] and [facility].

## 2015-03-24 ENCOUNTER — Encounter: Payer: Medicare Other | Admitting: *Deleted

## 2015-03-24 DIAGNOSIS — Z7189 Other specified counseling: Secondary | ICD-10-CM | POA: Diagnosis not present

## 2015-03-24 DIAGNOSIS — Z9861 Coronary angioplasty status: Secondary | ICD-10-CM

## 2015-03-24 NOTE — Progress Notes (Signed)
Daily Session Note  Patient Details  Name: Kyle Morton MRN: [id number] Date of Birth: 1950/08/19 Referring Provider:  [facility]  Encounter Date: 03/24/2015  Check In:     Session Check In - 03/24/15 0831    Check-In   Staff Present Candiss Norse MS, ACSM CEP Exercise Physiologist;Susanne Bice RN, BSN, CCRP;Kelly Alfonso Patten, ACSM CEP Exercise Physiologist   ER physicians immediately available to respond to emergencies See telemetry face sheet for immediately available ER MD   Medication changes reported     No   Fall or balance concerns reported    No   Warm-up and Cool-down Performed on first and last piece of equipment   VAD Patient? No   Pain Assessment   Currently in Pain? No/denies   Multiple Pain Sites No         Goals Met:  Independence with exercise equipment Exercise tolerated well Personal goals reviewed No report of cardiac concerns or symptoms Strength training completed today  Goals Unmet:  Not Applicable  Goals Comments:   Dr. Emily Filbert is Medical Director for [place] and [facility].

## 2015-03-26 ENCOUNTER — Encounter: Payer: Medicare Other | Admitting: *Deleted

## 2015-03-26 DIAGNOSIS — Z7189 Other specified counseling: Secondary | ICD-10-CM | POA: Diagnosis not present

## 2015-03-26 DIAGNOSIS — Z9861 Coronary angioplasty status: Secondary | ICD-10-CM

## 2015-03-26 NOTE — Progress Notes (Signed)
Daily Session Note  Patient Details  Name: Kyle Morton MRN: [id number] Date of Birth: 30-Jan-1951 Referring Provider:  [facility]  Encounter Date: 03/26/2015  Check In:     Session Check In - 03/26/15 2919    Check-In   Staff Present Candiss Norse MS, ACSM CEP Exercise Physiologist;Susanne Bice RN, BSN, CCRP;Steven Way BS, ACSM EP-C, Exercise Physiologist   ER physicians immediately available to respond to emergencies See telemetry face sheet for immediately available ER MD   Medication changes reported     No   Fall or balance concerns reported    No   Warm-up and Cool-down Performed on first and last piece of equipment   VAD Patient? No   Pain Assessment   Currently in Pain? No/denies   Multiple Pain Sites No           Exercise Prescription Changes - 03/26/15 0800    Exercise Review   Progression Yes   Response to Exercise   Symptoms None   Duration Progress to 30 minutes of continuous aerobic without signs/symptoms of physical distress   Intensity Rest + 30   Progression Continue progressive overload as per policy without signs/symptoms or physical distress.   Resistance Training   Training Prescription Yes   Weight 2   Reps 10-15   NuStep   Level 3   Watts 35   Minutes 25      Goals Met:  Independence with exercise equipment Exercise tolerated well Personal goals reviewed No report of cardiac concerns or symptoms Strength training completed today  Goals Unmet:  Not Applicable  Goals Comments: Reviewed individualized exercise prescription and made increases per departmental policy. Exercise increases were discussed with the patient and they were able to perform the new work loads without issue (no signs or symptoms).     Dr. Emily Filbert is Medical Director for [place] and [facility].

## 2015-03-28 ENCOUNTER — Encounter: Payer: Medicare Other | Admitting: *Deleted

## 2015-03-28 DIAGNOSIS — Z9861 Coronary angioplasty status: Secondary | ICD-10-CM

## 2015-03-28 DIAGNOSIS — Z7189 Other specified counseling: Secondary | ICD-10-CM | POA: Diagnosis not present

## 2015-03-28 DIAGNOSIS — I255 Ischemic cardiomyopathy: Secondary | ICD-10-CM

## 2015-03-28 NOTE — Progress Notes (Signed)
Daily Session Note  Patient Details  Name: Kyle Morton MRN: [id number] Date of Birth: 03-03-1951 Referring Provider:  [facility]  Encounter Date: 03/28/2015  Check In:     Session Check In - 03/28/15 0946    Check-In   Staff Present Heath Lark RN, BSN, CCRP;Carroll Enterkin RN, Drusilla Kanner MS, ACSM CEP Exercise Physiologist   ER physicians immediately available to respond to emergencies See telemetry face sheet for immediately available ER MD   Medication changes reported     No   Fall or balance concerns reported    No   Warm-up and Cool-down Performed on first and last piece of equipment   VAD Patient? No   Pain Assessment   Currently in Pain? No/denies         Goals Met:  Independence with exercise equipment Exercise tolerated well No report of cardiac concerns or symptoms Strength training completed today  Goals Unmet:  Not Applicable  Goals Comments: Doing well with exercise prescription progression.    Dr. Emily Filbert is Medical Director for [place] and [facility].

## 2015-03-31 ENCOUNTER — Encounter: Payer: Medicare Other | Admitting: *Deleted

## 2015-03-31 DIAGNOSIS — Z9861 Coronary angioplasty status: Secondary | ICD-10-CM

## 2015-03-31 DIAGNOSIS — Z7189 Other specified counseling: Secondary | ICD-10-CM | POA: Diagnosis not present

## 2015-03-31 NOTE — Progress Notes (Signed)
Daily Session Note  Patient Details  Name: Tobechukwu Emmick MRN: [id number] Date of Birth: 1950/12/07 Referring Provider:  [facility]  Encounter Date: 03/31/2015  Check In:     Session Check In - 03/31/15 0827    Check-In   Staff Present Candiss Norse MS, ACSM CEP Exercise Physiologist;Susanne Bice RN, BSN, CCRP;Shane Melby Alfonso Patten, ACSM CEP Exercise Physiologist   ER physicians immediately available to respond to emergencies See telemetry face sheet for immediately available ER MD   Medication changes reported     No   Fall or balance concerns reported    No   Warm-up and Cool-down Performed on first and last piece of equipment   VAD Patient? No   Pain Assessment   Currently in Pain? No/denies   Multiple Pain Sites No         Goals Met:  Independence with exercise equipment Exercise tolerated well No report of cardiac concerns or symptoms Strength training completed today  Goals Unmet:  Not Applicable  Goals Comments:    Dr. Emily Filbert is Medical Director for [place] and [facility].

## 2015-04-02 DIAGNOSIS — Z9861 Coronary angioplasty status: Secondary | ICD-10-CM

## 2015-04-02 DIAGNOSIS — Z7189 Other specified counseling: Secondary | ICD-10-CM | POA: Diagnosis not present

## 2015-04-02 NOTE — Progress Notes (Signed)
Daily Session Note  Patient Details  Name: Kyle Morton MRN: [id number] Date of Birth: 09-23-1950 Referring Provider:  [facility]  Encounter Date: 04/02/2015  Check In:     Session Check In - 04/02/15 0836    Check-In   Staff Present Lestine Box BS, ACSM EP-C, Exercise Physiologist;Renee [place] MS, ACSM CEP Exercise Physiologist;Mary Kellie Shropshire RN   ER physicians immediately available to respond to emergencies See telemetry face sheet for immediately available ER MD   Medication changes reported     No   Fall or balance concerns reported    No   Warm-up and Cool-down Performed on first and last piece of equipment   VAD Patient? No   Pain Assessment   Currently in Pain? No/denies         Goals Met:  Proper associated with RPD/PD & O2 Sat Exercise tolerated well No report of cardiac concerns or symptoms Strength training completed today  Goals Unmet:  Not Applicable  Goals Comments:    Dr. Emily Filbert is Medical Director for [place] and [facility].

## 2015-04-04 ENCOUNTER — Encounter: Payer: Medicare Other | Attending: General Practice | Admitting: *Deleted

## 2015-04-04 DIAGNOSIS — E785 Hyperlipidemia, unspecified: Secondary | ICD-10-CM | POA: Insufficient documentation

## 2015-04-04 DIAGNOSIS — Z7189 Other specified counseling: Secondary | ICD-10-CM | POA: Diagnosis not present

## 2015-04-04 DIAGNOSIS — Z9861 Coronary angioplasty status: Secondary | ICD-10-CM

## 2015-04-04 NOTE — Progress Notes (Signed)
Daily Session Note  Patient Details  Name: Kyle Morton MRN: [id number] Date of Birth: 05/05/1951 Referring Provider:  [facility]  Encounter Date: 04/04/2015  Check In:     Session Check In - 04/04/15 0850    Check-In   Staff Present Mary Jo Abernethy RN;Renee MacMillan MS, ACSM CEP Exercise Physiologist;Carroll Enterkin RN, BSN   ER physicians immediately available to respond to emergencies See telemetry face sheet for immediately available ER MD   Medication changes reported     No   Fall or balance concerns reported    No   Warm-up and Cool-down Performed on first and last piece of equipment   VAD Patient? No   Pain Assessment   Currently in Pain? No/denies         Goals Met:  Proper associated with RPD/PD & O2 Sat Exercise tolerated well No report of cardiac concerns or symptoms  Goals Unmet:  Not Applicable  Goals Comments: Likes working out in Cardiac REhab on the Recumbent Elliptical [id number].    Dr. Mark Miller is Medical Director for [facility] and [facility]. 

## 2015-04-09 DIAGNOSIS — Z7189 Other specified counseling: Secondary | ICD-10-CM | POA: Diagnosis not present

## 2015-04-09 DIAGNOSIS — Z9861 Coronary angioplasty status: Secondary | ICD-10-CM

## 2015-04-09 NOTE — Progress Notes (Signed)
Daily Session Note  Patient Details  Name: Kyle Morton MRN: [id number] Date of Birth: [birth year] Referring Provider:  [facility]  Encounter Date: 04/09/2015  Check In:     Session Check In - 04/09/15 0831    Check-In   Staff Present Lestine Box BS, ACSM EP-C, Exercise Physiologist;Susanne Bice RN, BSN, CCRP;Renee Dillard [place] MS, ACSM CEP Exercise Physiologist   ER physicians immediately available to respond to emergencies See telemetry face sheet for immediately available ER MD   Medication changes reported     No   Fall or balance concerns reported    No   Warm-up and Cool-down Performed on first and last piece of equipment   VAD Patient? No   Pain Assessment   Currently in Pain? No/denies         Goals Met:  Proper associated with RPD/PD & O2 Sat Exercise tolerated well No report of cardiac concerns or symptoms Strength training completed today  Goals Unmet:  Not Applicable  Goals Comments:    Dr. Emily Filbert is Medical Director for [place] and [facility].

## 2015-04-11 ENCOUNTER — Encounter: Payer: Medicare Other | Admitting: *Deleted

## 2015-04-11 DIAGNOSIS — Z7189 Other specified counseling: Secondary | ICD-10-CM | POA: Diagnosis not present

## 2015-04-11 DIAGNOSIS — I255 Ischemic cardiomyopathy: Secondary | ICD-10-CM

## 2015-04-11 DIAGNOSIS — Z9861 Coronary angioplasty status: Secondary | ICD-10-CM

## 2015-04-11 NOTE — Progress Notes (Signed)
Daily Session Note  Patient Details  Name: Varick Mota MRN: [id number] Date of Birth: 12/17/1950 Referring Provider:  [facility]  Encounter Date: 04/11/2015  Check In:     Session Check In - 04/11/15 1614    Check-In   Staff Present Susanne Bice RN, BSN, CCRP;Stacey Joyce RRT, RCP Respiratory Therapist;Renee MacMillan MS, ACSM CEP Exercise Physiologist   ER physicians immediately available to respond to emergencies See telemetry face sheet for immediately available ER MD   Medication changes reported     No   Fall or balance concerns reported    No   Warm-up and Cool-down Performed on first and last piece of equipment   VAD Patient? No   Pain Assessment   Currently in Pain? No/denies         Goals Met:  Independence with exercise equipment Exercise tolerated well No report of cardiac concerns or symptoms Strength training completed today  Goals Unmet:  Not Applicable  Goals Comments: Doing well with exercise prescription progression. Since PTCA completed, [place] has not had symptoms and is able to progress faster with his exercise and daily activities.   Dr. Mark Miller is Medical Director for [facility] and [facility]. 

## 2015-04-14 ENCOUNTER — Encounter: Payer: Self-pay | Admitting: *Deleted

## 2015-04-14 ENCOUNTER — Encounter: Payer: Medicare Other | Admitting: *Deleted

## 2015-04-14 DIAGNOSIS — I255 Ischemic cardiomyopathy: Secondary | ICD-10-CM

## 2015-04-14 DIAGNOSIS — Z7189 Other specified counseling: Secondary | ICD-10-CM | POA: Diagnosis not present

## 2015-04-14 DIAGNOSIS — Z9861 Coronary angioplasty status: Secondary | ICD-10-CM

## 2015-04-14 NOTE — Progress Notes (Signed)
Daily Session Note  Patient Details  Name: Kehinde Bowdish MRN: [id number] Date of Birth: 01-29-1951 Referring Provider:  [facility]  Encounter Date: 04/14/2015  Check In:     Session Check In - 04/14/15 1018    Check-In   Staff Present Candiss Norse MS, ACSM CEP Exercise Physiologist;Susanne Bice RN, BSN, CCRP;Andersyn Fragoso Alfonso Patten, ACSM CEP Exercise Physiologist   ER physicians immediately available to respond to emergencies See telemetry face sheet for immediately available ER MD   Medication changes reported     No   Fall or balance concerns reported    No   Warm-up and Cool-down Performed on first and last piece of equipment   VAD Patient? No   Pain Assessment   Currently in Pain? No/denies   Multiple Pain Sites No         Goals Met:  Independence with exercise equipment Exercise tolerated well No report of cardiac concerns or symptoms Strength training completed today  Goals Unmet:  Not Applicable  Goals Comments:    Dr. Emily Filbert is Medical Director for [place] and [facility].

## 2015-04-15 ENCOUNTER — Encounter: Payer: Self-pay | Admitting: *Deleted

## 2015-04-15 DIAGNOSIS — I255 Ischemic cardiomyopathy: Secondary | ICD-10-CM

## 2015-04-15 DIAGNOSIS — Z9861 Coronary angioplasty status: Secondary | ICD-10-CM

## 2015-04-15 NOTE — Progress Notes (Signed)
Cardiac Individual Treatment Plan  Patient Details  Name: Kyle Morton MRN: [id number] Date of Birth: Mar 15, 1951 Referring Provider:  No ref. provider found  Initial Encounter Date:    Visit Diagnosis: S/P PTCA (percutaneous transluminal coronary angioplasty)  Ischemic cardiomyopathy  Patient's Home Medications on Admission:  Current outpatient prescriptions:  .  allopurinol (ZYLOPRIM) 100 MG tablet, Take 200 mg by mouth daily. , Disp: , Rfl:  .  aspirin EC 81 MG tablet, Take 81 mg by mouth daily., Disp: , Rfl:  .  atorvastatin (LIPITOR) 80 MG tablet, Take 80 mg by mouth daily., Disp: , Rfl:  .  carvedilol (COREG) 12.5 MG tablet, Take 12.5 mg by mouth 2 (two) times daily with a meal., Disp: , Rfl:  .  cetirizine (ZYRTEC) 10 MG tablet, Take 10 mg by mouth daily., Disp: , Rfl:  .  clopidogrel (PLAVIX) 75 MG tablet, Take 1 tablet (75 mg total) by mouth daily., Disp: 30 tablet, Rfl: 11 .  colchicine 0.6 MG tablet, Take 0.6 mg by mouth as needed. , Disp: , Rfl:  .  dextrose (GLUTOSE) 40 % GEL, Take 1 Tube by mouth once as needed for low blood sugar., Disp: , Rfl:  .  fluticasone (FLONASE) 50 MCG/ACT nasal spray, Place 2 sprays into both nostrils daily as needed for allergies or rhinitis. , Disp: , Rfl:  .  Hexylresorcinol (THROAT LOZENGES MT), Use as directed 1 Dose in the mouth or throat every 4 (four) hours as needed (for sore throat)., Disp: , Rfl:  .  insulin glargine (LANTUS) 100 UNIT/ML injection, Inject 25 Units into the skin at bedtime., Disp: , Rfl:  .  isosorbide mononitrate (IMDUR) 60 MG 24 hr tablet, Take 0.5 tablets (30 mg total) by mouth daily., Disp: , Rfl:  .  Menthol-Methyl Salicylate (THERA-GESIC EX), Apply 1 Dose topically 2 (two) times daily. , Disp: , Rfl:  .  nitroGLYCERIN (NITROSTAT) 0.4 MG SL tablet, Place 1 tablet (0.4 mg total) under the tongue every 5 (five) minutes as needed for chest pain., Disp: 25 tablet, Rfl: 3 .  potassium chloride SA (K-DUR,KLOR-CON) 20 MEQ  tablet, Take 1 tablet (20 mEq total) by mouth 2 (two) times daily., Disp: 60 tablet, Rfl: 6 .  sacubitril-valsartan (ENTRESTO) 24-26 MG, Take 1 tablet by mouth 2 (two) times daily., Disp: , Rfl:  .  sodium fluoride (LURIDE) 1.1 (0.5 F) MG/ML SOLN, Take 1 drop by mouth daily as needed., Disp: , Rfl:  .  spironolactone (ALDACTONE) 25 MG tablet, Take 25 mg by mouth daily. , Disp: , Rfl:  .  torsemide (DEMADEX) 20 MG tablet, Take 10 mg by mouth daily. Take 53m daily by mouth for now. Hold dose if weight is less than 146 pounds., Disp: , Rfl:  .  traZODone (DESYREL) 100 MG tablet, Take 100 mg by mouth at bedtime., Disp: , Rfl:  .  venlafaxine XR (EFFEXOR-XR) 150 MG 24 hr capsule, Take 150 mg by mouth daily with breakfast., Disp: , Rfl:  .  Vitamin D, Cholecalciferol, 1000 UNITS CAPS, Take 2,000 Units by mouth daily., Disp: , Rfl:   Past Medical History: Past Medical History  Diagnosis Date  . Pleural effusion   . Chronic combined systolic and diastolic CHF (congestive heart failure)     a. reported baseline EF 30-35%; b. echo 12/2014: EF 20-25%, diffuse HK, restrictive filling pattern, severely dilated LA, mod MR/TR/PR, PASP 70 mm Hg, IVC dilted c/w elevated CVP. c. EF 15% by cath in 01/2015.  .Marland Kitchen  Coronary artery disease     a. xience stent to D1 in 2007; 2.5 x 15 mm stent to OM3 in 2013, w/ 3 stents in 2015 (details not available);  b. 01/2015 Cath: LM nl, LAD 60p, 20mISR, D1 95ost, 20 ISR, RI min irregs, LCX 20 ISR, OM1 small, OM2 30 ISR, OM3 40 ISR, RCA 100p, EF 15%, 2+ MR, elev R heart pres. c. 01/2015: s/p complex LAD diagonal bifurcation angioplasty/stenting with balloon angioplasty of the D1 & DES to prox LAD.  .Marland KitchenBenign essential HTN   . Hyperlipidemia   . DM2 (diabetes mellitus, type 2)   . PVC's (premature ventricular contractions)   . Vitamin D deficiency   . GERD (gastroesophageal reflux disease)   . Carotid artery stenosis     a. carotid dopplers 12/2014: less than 39% ICA bilaterally, f/u  1 year  . Depression   . Gout     feet   . CKD (chronic kidney disease), stage III   . QT prolongation   . Ischemic cardiomyopathy     Tobacco Use: History  Smoking status  . Never Smoker   Smokeless tobacco  . Never Used    Labs: Recent Review Flowsheet Data    Labs for ITP Cardiac and Pulmonary Rehab Latest Ref Rng 01/07/2015   Hemoglobin A1c 4.0 - 6.0 % 6.7(H)       Exercise Target Goals:    Exercise Program Goal: Individual exercise prescription set with THRR, safety & activity barriers. Participant demonstrates ability to understand and report RPE using BORG scale, to self-measure pulse accurately, and to acknowledge the importance of the exercise prescription.  Exercise Prescription Goal: Starting with aerobic activity 30 plus minutes a day, 3 days per week for initial exercise prescription. Provide home exercise prescription and guidelines that participant acknowledges understanding prior to discharge.  Activity Barriers & Risk Stratification:   6 Minute Walk:   Initial Exercise Prescription:   Exercise Prescription Changes:     Exercise Prescription Changes      03/10/15 0800 03/26/15 0800 03/28/15 0900 04/15/15 0600     Exercise Review   Progression No  Absent since last review Yes Yes Yes    Response to Exercise   Blood Pressure (Admit)    118/64 mmHg    Blood Pressure (Exercise)    142/90 mmHg    Blood Pressure (Exit)    130/84 mmHg    Heart Rate (Admit)    81 bpm    Heart Rate (Exercise)    95 bpm    Heart Rate (Exit)    84 bpm    Rating of Perceived Exertion (Exercise)    13    Symptoms  None None None    Duration  Progress to 30 minutes of continuous aerobic without signs/symptoms of physical distress Progress to 30 minutes of continuous aerobic without signs/symptoms of physical distress Progress to 30 minutes of continuous aerobic without signs/symptoms of physical distress    Intensity  Rest + 30 Rest + 30 Rest + 30    Progression   Continue progressive overload as per policy without signs/symptoms or physical distress. Continue progressive overload as per policy without signs/symptoms or physical distress. Continue progressive overload as per policy without signs/symptoms or physical distress.    Resistance Training   Training Prescription  Yes Yes Yes    Weight  2 3 3     Reps  10-15 10-15 10-15    Interval Training   Interval Training  Yes    Equipment    REL-XR    NuStep   Level  3 3 3     Watts  35 35 35    Minutes  25 25 25     REL-XR   Level    5    Watts    60    Minutes    30       Discharge Exercise Prescription (Final Exercise Prescription Changes):     Exercise Prescription Changes - 04/15/15 0600    Exercise Review   Progression Yes   Response to Exercise   Blood Pressure (Admit) 118/64 mmHg   Blood Pressure (Exercise) 142/90 mmHg   Blood Pressure (Exit) 130/84 mmHg   Heart Rate (Admit) 81 bpm   Heart Rate (Exercise) 95 bpm   Heart Rate (Exit) 84 bpm   Rating of Perceived Exertion (Exercise) 13   Symptoms None   Duration Progress to 30 minutes of continuous aerobic without signs/symptoms of physical distress   Intensity Rest + 30   Progression Continue progressive overload as per policy without signs/symptoms or physical distress.   Resistance Training   Training Prescription Yes   Weight 3   Reps 10-15   Interval Training   Interval Training Yes   Equipment REL-XR   NuStep   Level 3   Watts 35   Minutes 25   REL-XR   Level 5   Watts 60   Minutes 30      Nutrition:  Target Goals: Understanding of nutrition guidelines, daily intake of sodium <1566m, cholesterol <2027m calories 30% from fat and 7% or less from saturated fats, daily to have 5 or more servings of fruits and vegetables.  Biometrics:    Nutrition Therapy Plan and Nutrition Goals:   Nutrition Discharge: Rate Your Plate Scores:   Nutrition Goals Re-Evaluation:     Nutrition Goals Re-Evaluation       03/19/15 1037 04/14/15 1341         Personal Goal #1 Re-Evaluation   Personal Goal #1 Cont to decrease his sodium intake.  Cont to decrease his sodium intake.       Goal Progress Seen Yes Yes      Comments  States he is reading food labels       Personal Goal #2 Re-Evaluation   Personal Goal #2  Add more  whole grains with goal of three servings a day      Goal Progress Seen  Yes      Comments  WiLeemons eating brown bread instead of white. Reminded to make sure is whole wheat flour, not refined.       Personal Goal #3 Re-Evaluation   Personal Goal #3  Read labels for carbohydrates,saturated fat and trans fat      Goal Progress Seen  Yes      Comments  WiLondynas been reading food labels and eating as healthy as he can.          Psychosocial: Target Goals: Acknowledge presence or absence of depression, maximize coping skills, provide positive support system. Participant is able to verbalize types and ability to use techniques and skills needed for reducing stress and depression.  Initial Review & Psychosocial Screening:   Quality of Life Scores:   PHQ-9:     Recent Review Flowsheet Data    There is no flowsheet data to display.      Psychosocial Evaluation and Intervention:     Psychosocial Evaluation - 04/02/15 1005  Psychosocial Evaluation & Interventions   Interventions Stress management education;Relaxation education;Encouraged to exercise with the program and follow exercise prescription   Comments Counselor met with Kyle Morton today for initial psychosocial evaluation since returning to this program - as he was out for awhile.  Kyle Morton is a 64 year old who has a strong support system with relatives and a friend who live close by as well as active involvement in his local church community.  Other than heart health issues, he states he has diabetes and has struggled with PTSD symptoms for quite some time.  He continues to struggle with sleep even though he has been  on Trazodone for this, he admits to only 5-6 hours sleep approximately 5 nights per week.  He reports a history of depression and anxiety subsequent to serving in the [employer], and states that his medications are helpful and working well at this time for those symptoms.  Kyle Morton is seeing a psychiatrist for follow up with [facility] for his mental health issues.  He plans to purchase some home equipment to continue exercising consistently once he completes this program.     Continued Psychosocial Services Needed Yes  Kyle Morton will benefit from all of the psychoeducational components of this program due to his PTSD diagnosis.  He will also benefit from consistent exercise now and once he graduates from this program for his physical as well as mental health.        Psychosocial Re-Evaluation:   Vocational Rehabilitation: Provide vocational rehab assistance to qualifying candidates.   Vocational Rehab Evaluation & Intervention:   Education: Education Goals: Education classes will be provided on a weekly basis, covering required topics. Participant will state understanding/return demonstration of topics presented.  Learning Barriers/Preferences:   Education Topics: General Nutrition Guidelines/Fats and Fiber: -Group instruction provided by verbal, written material, models and posters to present the general guidelines for heart healthy nutrition. Gives an explanation and review of dietary fats and fiber.   Controlling Sodium/Reading Food Labels: -Group verbal and written material supporting the discussion of sodium use in heart healthy nutrition. Review and explanation with models, verbal and written materials for utilization of the food label.          Cardiac Rehab from 04/14/2015 in [facility] Cardiac Rehab   Date  03/24/15   Educator  CR   Instruction Review Code  2- meets goals/outcomes      Exercise Physiology & Risk Factors: - Group verbal and written instruction with models to  review the exercise physiology of the cardiovascular system and associated critical values. Details cardiovascular disease risk factors and the goals associated with each risk factor.      Cardiac Rehab from 04/14/2015 in [facility] Cardiac Rehab   Date  04/09/15   Educator  RM   Instruction Review Code  2- meets goals/outcomes      Aerobic Exercise & Resistance Training: - Gives group verbal and written discussion on the health impact of inactivity. On the components of aerobic and resistive training programs and the benefits of this training and how to safely progress through these programs.      Cardiac Rehab from 04/14/2015 in [facility] Cardiac Rehab   Date  04/14/15   Educator  RM   Instruction Review Code  2- meets goals/outcomes      Flexibility, Balance, General Exercise Guidelines: - Provides group verbal and written instruction on the benefits of flexibility and balance training programs. Provides general exercise guidelines with specific  guidelines to those with heart or lung disease. Demonstration and skill practice provided.   Stress Management: - Provides group verbal and written instruction about the health risks of elevated stress, cause of high stress, and healthy ways to reduce stress.   Depression: - Provides group verbal and written instruction on the correlation between heart/lung disease and depressed mood, treatment options, and the stigmas associated with seeking treatment.      Cardiac Rehab from 04/14/2015 in [facility] Cardiac Rehab   Date  03/26/15   Educator  [facility]   Instruction Review Code  2- meets goals/outcomes      Anatomy & Physiology of the Heart: - Group verbal and written instruction and models provide basic cardiac anatomy and physiology, with the coronary electrical and arterial systems. Review of: AMI, Angina, Valve disease, Heart Failure, Cardiac Arrhythmia, Pacemakers, and the ICD.   Cardiac Procedures: - Group verbal and written instruction and models to  describe the testing methods done to diagnose heart disease. Reviews the outcomes of the test results. Describes the treatment choices: Medical Management, Angioplasty, or Coronary Bypass Surgery.      Cardiac Rehab from 04/14/2015 in [facility] Cardiac Rehab   Date  03/31/15   Educator  SB   Instruction Review Code  2- meets goals/outcomes      Cardiac Medications: - Group verbal and written instruction to review commonly prescribed medications for heart disease. Reviews the medication, class of the drug, and side effects. Includes the steps to properly store meds and maintain the prescription regimen.      Cardiac Rehab from 04/14/2015 in [facility] Cardiac Rehab   Date  04/02/15   Educator  MA   Instruction Review Code  2- meets goals/outcomes      Go Sex-Intimacy & Heart Disease, Get SMART - Goal Setting: - Group verbal and written instruction through game format to discuss heart disease and the return to sexual intimacy. Provides group verbal and written material to discuss and apply goal setting through the application of the S.M.A.R.T. Method.      Cardiac Rehab from 04/14/2015 in [facility] Cardiac Rehab   Date  03/31/15   Educator  SB   Instruction Review Code  2- meets goals/outcomes      Other Matters of the Heart: - Provides group verbal, written materials and models to describe Heart Failure, Angina, Valve Disease, and Diabetes in the realm of heart disease. Includes description of the disease process and treatment options available to the cardiac patient.   Exercise & Equipment Safety: - Individual verbal instruction and demonstration of equipment use and safety with use of the equipment.      Cardiac Rehab from 04/14/2015 in [facility] Cardiac Rehab   Date  03/26/15   Educator  RM   Instruction Review Code  2- meets goals/outcomes      Infection Prevention: - Provides verbal and written material to individual with discussion of infection control including proper hand washing and proper  equipment cleaning during exercise session.      Cardiac Rehab from 04/14/2015 in [facility] Cardiac Rehab   Date  03/26/15   Educator  RM   Instruction Review Code  2- meets goals/outcomes      Falls Prevention: - Provides verbal and written material to individual with discussion of falls prevention and safety.      Cardiac Rehab from 04/14/2015 in [facility] Cardiac Rehab   Date  03/26/15   Educator  RM   Instruction Review Code  2- meets goals/outcomes  Diabetes: - Individual verbal and written instruction to review signs/symptoms of diabetes, desired ranges of glucose level fasting, after meals and with exercise. Advice that pre and post exercise glucose checks will be done for 3 sessions at entry of program.    Knowledge Questionnaire Score:   Personal Goals and Risk Factors at Admission:   Personal Goals and Risk Factors Review:      Goals and Risk Factor Review      03/19/15 1037 04/14/15 1344         Increase Aerobic Exercise and Physical Activity   Goals Progress/Improvement seen  Yes       Comments Finally able to return to Cardiac Rehab.  Progressing well with exercise prescription, feeling better since angioplasty which has enabled him to progress at a better pace.       Hypertension   Goal  Participant will see blood pressure controlled within the values of 140/62m/Hg or within value directed by their physician.      Progress seen toward goals  Yes      Comments  BP reading in normal ranges         Personal Goals Discharge:     Comments: Doing well with exercise prescription progression. Continue with ITP.  Kyle Morton doing better with exercise progression since the latest PTCA. Symptoms are well controlled.

## 2015-04-16 ENCOUNTER — Other Ambulatory Visit: Payer: Self-pay | Admitting: *Deleted

## 2015-04-16 DIAGNOSIS — Z9861 Coronary angioplasty status: Secondary | ICD-10-CM

## 2015-04-16 DIAGNOSIS — Z7189 Other specified counseling: Secondary | ICD-10-CM | POA: Diagnosis not present

## 2015-04-16 DIAGNOSIS — I255 Ischemic cardiomyopathy: Secondary | ICD-10-CM

## 2015-04-16 NOTE — Progress Notes (Signed)
Daily Session Note  Patient Details  Name: Kyle Morton MRN: [id number] Date of Birth: 11-20-1950 Referring Provider:  [facility]  Encounter Date: 04/16/2015  Check In:     Session Check In - 04/16/15 0918    Check-In   Staff Present Heath Lark RN, BSN, CCRP;Oswald Pott BS, ACSM EP-C, Exercise Physiologist;Renee Dillard [place] MS, ACSM CEP Exercise Physiologist   ER physicians immediately available to respond to emergencies See telemetry face sheet for immediately available ER MD   Medication changes reported     No   Fall or balance concerns reported    No   Warm-up and Cool-down Performed on first and last piece of equipment   VAD Patient? No   Pain Assessment   Currently in Pain? No/denies         Goals Met:  Proper associated with RPD/PD & O2 Sat Exercise tolerated well No report of cardiac concerns or symptoms Strength training completed today  Goals Unmet:  Not Applicable  Goals Comments: Bryce tried out some new interval training on XR, did well, slightly more fatigued at the end of exercise.   Dr. Emily Filbert is Medical Director for [place] and [facility].

## 2015-04-21 ENCOUNTER — Encounter: Payer: Medicare Other | Admitting: *Deleted

## 2015-04-21 DIAGNOSIS — Z9861 Coronary angioplasty status: Secondary | ICD-10-CM

## 2015-04-21 DIAGNOSIS — Z7189 Other specified counseling: Secondary | ICD-10-CM | POA: Diagnosis not present

## 2015-04-21 NOTE — Progress Notes (Signed)
Daily Session Note  Patient Details  Name: Kyle Morton MRN: [id number] Date of Birth: Aug 20, 1950 Referring Provider:  [facility]  Encounter Date: 04/21/2015  Check In:     Session Check In - 04/21/15 0843    Check-In   Staff Present Candiss Norse MS, ACSM CEP Exercise Physiologist;Susanne Bice RN, BSN, CCRP;Kelly Alfonso Patten, ACSM CEP Exercise Physiologist   ER physicians immediately available to respond to emergencies See telemetry face sheet for immediately available ER MD   Medication changes reported     No   Fall or balance concerns reported    No   Warm-up and Cool-down Performed on first and last piece of equipment   VAD Patient? No   Pain Assessment   Currently in Pain? No/denies   Multiple Pain Sites No         Goals Met:  Independence with exercise equipment Exercise tolerated well No report of cardiac concerns or symptoms Strength training completed today  Goals Unmet:  Not Applicable  Goals Comments: [name] completed interval training on the XR again today and will continue to do interval training 2-3 times/week.    Dr. Emily Filbert is Medical Director for [place] and [facility].

## 2015-04-23 ENCOUNTER — Encounter: Payer: Medicare Other | Admitting: *Deleted

## 2015-04-23 DIAGNOSIS — Z7189 Other specified counseling: Secondary | ICD-10-CM | POA: Diagnosis not present

## 2015-04-23 DIAGNOSIS — Z9861 Coronary angioplasty status: Secondary | ICD-10-CM

## 2015-04-23 NOTE — Progress Notes (Signed)
Daily Session Note  Patient Details  Name: Kyle Morton MRN: [id number] Date of Birth: 1951/06/30 Referring Ewan Grau:  [facility]  Encounter Date: 04/23/2015  Check In:     Session Check In - 04/23/15 0830    Check-In   Staff Present Candiss Norse MS, ACSM CEP Exercise Physiologist;Susanne Bice RN, BSN, CCRP;Steven Way BS, ACSM EP-C, Exercise Physiologist   ER physicians immediately available to respond to emergencies See telemetry face sheet for immediately available ER MD   Medication changes reported     No   Fall or balance concerns reported    No   Warm-up and Cool-down Performed on first and last piece of equipment   VAD Patient? No   Pain Assessment   Currently in Pain? No/denies   Multiple Pain Sites No         Goals Met:  Independence with exercise equipment Exercise tolerated well No report of cardiac concerns or symptoms  Goals Unmet:  Not Applicable  Goals Comments: Refael continues to do well with his exercise progression since the PTCA.   Dr. Emily Filbert is Medical Director for [place] and [facility].

## 2015-04-23 NOTE — Progress Notes (Signed)
Daily Session Note  Patient Details  Name: Kyle Morton MRN: [id number] Date of Birth: 08/23/1950 Referring Provider:  [facility]  Encounter Date: 04/23/2015  Check In:     Session Check In - 04/23/15 0830    Check-In   Staff Present Renee MacMillan MS, ACSM CEP Exercise Physiologist;Susanne Bice RN, BSN, CCRP;Steven Way BS, ACSM EP-C, Exercise Physiologist   ER physicians immediately available to respond to emergencies See telemetry face sheet for immediately available ER MD   Medication changes reported     No   Fall or balance concerns reported    No   Warm-up and Cool-down Performed on first and last piece of equipment   VAD Patient? No   Pain Assessment   Currently in Pain? No/denies   Multiple Pain Sites No         Goals Met:  Independence with exercise equipment Exercise tolerated well No report of cardiac concerns or symptoms Strength training completed today  Goals Unmet:  Not Applicable  Goals Comments: Continued success with interval training on the XR machine.    Dr. Mark Miller is Medical Director for [facility] and [facility]. 

## 2015-04-25 ENCOUNTER — Encounter: Payer: Medicare Other | Admitting: *Deleted

## 2015-04-25 DIAGNOSIS — Z7189 Other specified counseling: Secondary | ICD-10-CM | POA: Diagnosis not present

## 2015-04-25 DIAGNOSIS — Z9861 Coronary angioplasty status: Secondary | ICD-10-CM

## 2015-04-25 NOTE — Progress Notes (Signed)
Daily Session Note  Patient Details  Name: Kyle Morton MRN: [id number] Date of Birth: 07-Feb-1951 Referring Provider:  [facility]  Encounter Date: 04/25/2015  Check In:     Session Check In - 04/25/15 0912    Check-In   Staff Present Heath Lark RN, BSN, CCRP;Carroll Enterkin RN, Drusilla Kanner MS, ACSM CEP Exercise Physiologist   ER physicians immediately available to respond to emergencies See telemetry face sheet for immediately available ER MD   Medication changes reported     No   Warm-up and Cool-down Performed on first and last piece of equipment   VAD Patient? No   Pain Assessment   Currently in Pain? No/denies         Goals Met:  Proper associated with RPD/PD & O2 Sat Exercise tolerated well No report of cardiac concerns or symptoms  Goals Unmet:  Not Applicable  Goals Comments: Given information about the [employer] and 6:15am [employer] fit class that is open. He will discuss with MD and is so glad that he didn't need a defib but they took 12 lbs of fluid off him.    Dr. Emily Filbert is Medical Director for [place] and [facility].

## 2015-04-28 ENCOUNTER — Encounter: Payer: Medicare Other | Admitting: *Deleted

## 2015-04-28 VITALS — Ht 66.25 in | Wt 159.0 lb

## 2015-04-28 DIAGNOSIS — Z7189 Other specified counseling: Secondary | ICD-10-CM | POA: Diagnosis not present

## 2015-04-28 DIAGNOSIS — Z9861 Coronary angioplasty status: Secondary | ICD-10-CM

## 2015-04-28 LAB — GLUCOSE, CAPILLARY
Glucose-Capillary: 294 mg/dL — ABNORMAL HIGH (ref 65–99)
Glucose-Capillary: 243 mg/dL — ABNORMAL HIGH (ref 65–99)

## 2015-04-28 NOTE — Progress Notes (Signed)
Daily Session Note  Patient Details  Name: Kyle Morton MRN: [id number] Date of Birth: April 18, 1951 Referring Provider:  [facility]  Encounter Date: 04/28/2015  Check In:     Session Check In - 04/28/15 0829    Check-In   Staff Present Earlean Shawl BS, ACSM CEP Exercise Physiologist;Renee Dillard Essex MS, ACSM CEP Exercise Physiologist;Susanne Bice RN, BSN, CCRP   ER physicians immediately available to respond to emergencies See telemetry face sheet for immediately available ER MD   Medication changes reported     No   Fall or balance concerns reported    No   Warm-up and Cool-down Performed on first and last piece of equipment   Pain Assessment   Currently in Pain? No/denies   Multiple Pain Sites No         Goals Met:  Independence with exercise equipment Exercise tolerated well Personal goals reviewed No report of cardiac concerns or symptoms Strength training completed today  Goals Unmet:  Not Applicable  Goals Comments: Graduation today. 6 min walk preformed today. Pre and post measurements/results reviewed with the patient.   Patient is approaching graduation of the program and home exercise plans were discussed. Details of the patient's exercise prescription and what they need to do in order to continue the prescription and progress with exercise were outlined and the patient verbalized understanding. The patient plans to complete all exercise walking at home and at the local mall.    Dr. Emily Filbert is Medical Director for [facility].

## 2015-04-28 NOTE — Patient Instructions (Signed)
Discharge Instructions  Patient Details  Name: Kyle Morton MRN: [id number] Date of Birth: [birth year] Referring Provider:  [facility]   Number of Visits:   Reason for Discharge:  Patient reached a stable level of exercise. Patient independent in their exercise.  Smoking History:  History  Smoking status  . Never Smoker   Smokeless tobacco  . Never Used    Diagnosis:  S/P PTCA (percutaneous transluminal coronary angioplasty)  Initial Exercise Prescription:   Discharge Exercise Prescription (Final Exercise Prescription Changes):     Exercise Prescription Changes - 04/28/15 0800    Exercise Review   Progression Yes   Response to Exercise   Blood Pressure (Admit) 118/64 mmHg   Blood Pressure (Exercise) 142/90 mmHg   Blood Pressure (Exit) 130/84 mmHg   Heart Rate (Admit) 81 bpm   Heart Rate (Exercise) 95 bpm   Heart Rate (Exit) 84 bpm   Rating of Perceived Exertion (Exercise) 13   Symptoms None   Duration Progress to 30 minutes of continuous aerobic without signs/symptoms of physical distress   Intensity Rest + 30   Progression Continue progressive overload as per policy without signs/symptoms or physical distress.   Resistance Training   Training Prescription Yes   Weight 3   Reps 10-15   Interval Training   Interval Training Yes   Equipment REL-XR   NuStep   Level 3   Watts 35   Minutes 25   REL-XR   Level 5   Watts 60   Minutes 30   Home Exercise Plan   Plans to continue exercise at Home  walking at home and the mall      Functional Capacity:     6 Minute Walk      04/28/15 0826       6 Minute Walk   Phase Discharge     Distance 1250 feet     Distance % Change 257 %     Walk Time 6 minutes     Resting HR 82 bpm     Resting BP 110/64 mmHg     Max Ex. HR 101 bpm     Max Ex. BP 148/70 mmHg     RPE 13     Symptoms No        Quality of Life:   Personal Goals: Goals established at orientation with interventions provided to  work toward goal.    Personal Goals Discharge:     Goals and Risk Factor Review - 04/25/15 0945    Increase Aerobic Exercise and Physical Activity   Goals Progress/Improvement seen  Yes   Comments Will states he feels so much better since he started Cardiac Rehab. Aizik also states his is glad that he didn't need an Internal Defib like [facility] wanted but Ronelle reported that Dr. Mariah Milling said that could be put on hold for now and Jadrien realizes exercising in Cardiac Rehab helps him with that.      Nutrition & Weight - Outcomes:      Post Biometrics - 04/28/15 0824     Post  Biometrics   Height 5' 6.25" (1.683 m)   Weight 159 lb (72.122 kg)   Waist Circumference 35.25 inches   Hip Circumference 37.35 inches   Waist to Hip Ratio 0.94 %   BMI (Calculated) 25.5      Nutrition:   Nutrition Discharge:   Education Questionnaire Score:   Goals reviewed with patient; copy given to patient.

## 2015-05-07 ENCOUNTER — Telehealth: Payer: Self-pay | Admitting: Cardiovascular Disease

## 2015-05-07 NOTE — Telephone Encounter (Signed)
° °  FYI Patient was recently at [facility] and was told to see Mariah Milling about chf and fluid Scheduled first available on 10-28 with Gollan and will add to waitlist.

## 2015-05-14 ENCOUNTER — Encounter: Payer: Self-pay | Admitting: *Deleted

## 2015-05-14 DIAGNOSIS — Z9861 Coronary angioplasty status: Secondary | ICD-10-CM

## 2015-05-14 NOTE — Progress Notes (Signed)
Cardiac Individual Treatment Plan  Patient Details  Name: Kyle Morton MRN: [id number] Date of Birth: 26-Feb-1951 Referring Provider:  No ref. provider found  Initial Encounter Date:    Visit Diagnosis: S/P PTCA (percutaneous transluminal coronary angioplasty)  Patient's Home Medications on Admission:  Current outpatient prescriptions:  .  allopurinol (ZYLOPRIM) 100 MG tablet, Take 200 mg by mouth daily. , Disp: , Rfl:  .  aspirin EC 81 MG tablet, Take 81 mg by mouth daily., Disp: , Rfl:  .  atorvastatin (LIPITOR) 80 MG tablet, Take 80 mg by mouth daily., Disp: , Rfl:  .  carvedilol (COREG) 12.5 MG tablet, Take 12.5 mg by mouth 2 (two) times daily with a meal., Disp: , Rfl:  .  cetirizine (ZYRTEC) 10 MG tablet, Take 10 mg by mouth daily., Disp: , Rfl:  .  clopidogrel (PLAVIX) 75 MG tablet, Take 1 tablet (75 mg total) by mouth daily., Disp: 30 tablet, Rfl: 11 .  colchicine 0.6 MG tablet, Take 0.6 mg by mouth as needed. , Disp: , Rfl:  .  dextrose (GLUTOSE) 40 % GEL, Take 1 Tube by mouth once as needed for low blood sugar., Disp: , Rfl:  .  fluticasone (FLONASE) 50 MCG/ACT nasal spray, Place 2 sprays into both nostrils daily as needed for allergies or rhinitis. , Disp: , Rfl:  .  Hexylresorcinol (THROAT LOZENGES MT), Use as directed 1 Dose in the mouth or throat every 4 (four) hours as needed (for sore throat)., Disp: , Rfl:  .  insulin glargine (LANTUS) 100 UNIT/ML injection, Inject 25 Units into the skin at bedtime., Disp: , Rfl:  .  isosorbide mononitrate (IMDUR) 60 MG 24 hr tablet, Take 0.5 tablets (30 mg total) by mouth daily., Disp: , Rfl:  .  Menthol-Methyl Salicylate (THERA-GESIC EX), Apply 1 Dose topically 2 (two) times daily. , Disp: , Rfl:  .  nitroGLYCERIN (NITROSTAT) 0.4 MG SL tablet, Place 1 tablet (0.4 mg total) under the tongue every 5 (five) minutes as needed for chest pain., Disp: 25 tablet, Rfl: 3 .  potassium chloride SA (K-DUR,KLOR-CON) 20 MEQ tablet, Take 1 tablet (20  mEq total) by mouth 2 (two) times daily., Disp: 60 tablet, Rfl: 6 .  sacubitril-valsartan (ENTRESTO) 24-26 MG, Take 1 tablet by mouth 2 (two) times daily., Disp: , Rfl:  .  sodium fluoride (LURIDE) 1.1 (0.5 F) MG/ML SOLN, Take 1 drop by mouth daily as needed., Disp: , Rfl:  .  spironolactone (ALDACTONE) 25 MG tablet, Take 25 mg by mouth daily. , Disp: , Rfl:  .  torsemide (DEMADEX) 20 MG tablet, Take 10 mg by mouth daily. Take 81m daily by mouth for now. Hold dose if weight is less than 146 pounds., Disp: , Rfl:  .  traZODone (DESYREL) 100 MG tablet, Take 100 mg by mouth at bedtime., Disp: , Rfl:  .  venlafaxine XR (EFFEXOR-XR) 150 MG 24 hr capsule, Take 150 mg by mouth daily with breakfast., Disp: , Rfl:  .  Vitamin D, Cholecalciferol, 1000 UNITS CAPS, Take 2,000 Units by mouth daily., Disp: , Rfl:   Past Medical History: Past Medical History  Diagnosis Date  . Pleural effusion   . Chronic combined systolic and diastolic CHF (congestive heart failure)     a. reported baseline EF 30-35%; b. echo 12/2014: EF 20-25%, diffuse HK, restrictive filling pattern, severely dilated LA, mod MR/TR/PR, PASP 70 mm Hg, IVC dilted c/w elevated CVP. c. EF 15% by cath in 01/2015.  .Marland KitchenCoronary artery disease  a. xience stent to D1 in 2007; 2.5 x 15 mm stent to OM3 in 2013, w/ 3 stents in 2015 (details not available);  b. 01/2015 Cath: LM nl, LAD 60p, 65mISR, D1 95ost, 20 ISR, RI min irregs, LCX 20 ISR, OM1 small, OM2 30 ISR, OM3 40 ISR, RCA 100p, EF 15%, 2+ MR, elev R heart pres. c. 01/2015: s/p complex LAD diagonal bifurcation angioplasty/stenting with balloon angioplasty of the D1 & DES to prox LAD.  .Marland KitchenBenign essential HTN   . Hyperlipidemia   . DM2 (diabetes mellitus, type 2)   . PVC's (premature ventricular contractions)   . Vitamin D deficiency   . GERD (gastroesophageal reflux disease)   . Carotid artery stenosis     a. carotid dopplers 12/2014: less than 39% ICA bilaterally, f/u 1 year  . Depression   .  Gout     feet   . CKD (chronic kidney disease), stage III   . QT prolongation   . Ischemic cardiomyopathy     Tobacco Use: History  Smoking status  . Never Smoker   Smokeless tobacco  . Never Used    Labs: Recent Review Flowsheet Data    Labs for ITP Cardiac and Pulmonary Rehab Latest Ref Rng 01/07/2015   Hemoglobin A1c 4.0 - 6.0 % 6.7(H)       Exercise Target Goals:    Exercise Program Goal: Individual exercise prescription set with THRR, safety & activity barriers. Participant demonstrates ability to understand and report RPE using BORG scale, to self-measure pulse accurately, and to acknowledge the importance of the exercise prescription.  Exercise Prescription Goal: Starting with aerobic activity 30 plus minutes a day, 3 days per week for initial exercise prescription. Provide home exercise prescription and guidelines that participant acknowledges understanding prior to discharge.  Activity Barriers & Risk Stratification:   6 Minute Walk:     6 Minute Walk      04/28/15 0826       6 Minute Walk   Phase Discharge     Distance 1250 feet     Distance % Change 257 %     Walk Time 6 minutes     Resting HR 82 bpm     Resting BP 110/64 mmHg     Max Ex. HR 101 bpm     Max Ex. BP 148/70 mmHg     RPE 13     Symptoms No        Initial Exercise Prescription:   Exercise Prescription Changes:     Exercise Prescription Changes      03/26/15 0800 03/28/15 0900 04/15/15 0600 04/28/15 0800     Exercise Review   Progression Yes Yes Yes Yes    Response to Exercise   Blood Pressure (Admit)   118/64 mmHg 118/64 mmHg    Blood Pressure (Exercise)   142/90 mmHg 142/90 mmHg    Blood Pressure (Exit)   130/84 mmHg 130/84 mmHg    Heart Rate (Admit)   81 bpm 81 bpm    Heart Rate (Exercise)   95 bpm 95 bpm    Heart Rate (Exit)   84 bpm 84 bpm    Rating of Perceived Exertion (Exercise)   13 13    Symptoms None None None None    Duration Progress to 30 minutes of  continuous aerobic without signs/symptoms of physical distress Progress to 30 minutes of continuous aerobic without signs/symptoms of physical distress Progress to 30 minutes of continuous aerobic without signs/symptoms of  physical distress Progress to 30 minutes of continuous aerobic without signs/symptoms of physical distress    Intensity Rest + 30 Rest + 30 Rest + 30 Rest + 30    Progression Continue progressive overload as per policy without signs/symptoms or physical distress. Continue progressive overload as per policy without signs/symptoms or physical distress. Continue progressive overload as per policy without signs/symptoms or physical distress. Continue progressive overload as per policy without signs/symptoms or physical distress.    Resistance Training   Training Prescription Yes Yes Yes Yes    Weight 2 3 3 3     Reps 10-15 10-15 10-15 10-15    Interval Training   Interval Training   Yes Yes    Equipment   REL-XR REL-XR    NuStep   Level 3 3 3 3     Watts 35 35 35 35    Minutes 25 25 25 25     REL-XR   Level   5 5    Watts   60 60    Minutes   30 30    Home Exercise Plan   Plans to continue exercise at    Home  walking at home and the mall       Discharge Exercise Prescription (Final Exercise Prescription Changes):     Exercise Prescription Changes - 04/28/15 0800    Exercise Review   Progression Yes   Response to Exercise   Blood Pressure (Admit) 118/64 mmHg   Blood Pressure (Exercise) 142/90 mmHg   Blood Pressure (Exit) 130/84 mmHg   Heart Rate (Admit) 81 bpm   Heart Rate (Exercise) 95 bpm   Heart Rate (Exit) 84 bpm   Rating of Perceived Exertion (Exercise) 13   Symptoms None   Duration Progress to 30 minutes of continuous aerobic without signs/symptoms of physical distress   Intensity Rest + 30   Progression Continue progressive overload as per policy without signs/symptoms or physical distress.   Resistance Training   Training Prescription Yes   Weight 3    Reps 10-15   Interval Training   Interval Training Yes   Equipment REL-XR   NuStep   Level 3   Watts 35   Minutes 25   REL-XR   Level 5   Watts 60   Minutes 30   Home Exercise Plan   Plans to continue exercise at Home  walking at home and the mall      Nutrition:  Target Goals: Understanding of nutrition guidelines, daily intake of sodium <1565m, cholesterol <202m calories 30% from fat and 7% or less from saturated fats, daily to have 5 or more servings of fruits and vegetables.  Biometrics:      PoBarbie Haggis 04/28/15 0824     Post  Biometrics   Height 5' 6.25" (1.683 m)   Weight 159 lb (72.122 kg)   Waist Circumference 35.25 inches   Hip Circumference 37.35 inches   Waist to Hip Ratio 0.94 %   BMI (Calculated) 25.5      Nutrition Therapy Plan and Nutrition Goals:   Nutrition Discharge: Rate Your Plate Scores:   Nutrition Goals Re-Evaluation:     Nutrition Goals Re-Evaluation      04/14/15 1341 04/25/15 0944         Personal Goal #1 Re-Evaluation   Personal Goal #1 Cont to decrease his sodium intake.  Will realized that he had 12 extra lbs of fluid and decreasing his sodium intake keeps that off.  Goal Progress Seen Yes Yes      Comments States he is reading food labels        Personal Goal #2 Re-Evaluation   Personal Goal #2 Add more  whole grains with goal of three servings a day       Goal Progress Seen Yes       Comments Bradon is eating brown bread instead of white. Reminded to make sure is whole wheat flour, not refined.        Personal Goal #3 Re-Evaluation   Personal Goal #3 Read labels for carbohydrates,saturated fat and trans fat       Goal Progress Seen Yes       Comments Abdulai has been reading food labels and eating as healthy as he can.           Psychosocial: Target Goals: Acknowledge presence or absence of depression, maximize coping skills, provide positive support system. Participant is able to verbalize types and  ability to use techniques and skills needed for reducing stress and depression.  Initial Review & Psychosocial Screening:   Quality of Life Scores:   PHQ-9:     Recent Review Flowsheet Data    There is no flowsheet data to display.      Psychosocial Evaluation and Intervention:     Psychosocial Evaluation - 04/02/15 1005    Psychosocial Evaluation & Interventions   Interventions Stress management education;Relaxation education;Encouraged to exercise with the program and follow exercise prescription   Comments Counselor met with Mr. Dollins today for initial psychosocial evaluation since returning to this program - as he was out for awhile.  Mr. Albrecht is a 64 year old who has a strong support system with relatives and a friend who live close by as well as active involvement in his local church community.  Other than heart health issues, he states he has diabetes and has struggled with PTSD symptoms for quite some time.  He continues to struggle with sleep even though he has been on Trazodone for this, he admits to only 5-6 hours sleep approximately 5 nights per week.  He reports a history of depression and anxiety subsequent to serving in the [employer], and states that his medications are helpful and working well at this time for those symptoms.  Mr. Beltran is seeing a psychiatrist for follow up with the [facility] for his mental health issues.  He plans to purchase some home equipment to continue exercising consistently once he completes this program.     Continued Psychosocial Services Needed Yes  Mr. Wahlen will benefit from all of the psychoeducational components of this program due to his PTSD diagnosis.  He will also benefit from consistent exercise now and once he graduates from this program for his physical as well as mental health.        Psychosocial Re-Evaluation:     Psychosocial Re-Evaluation      04/25/15 0947           Psychosocial Re-Evaluation   Interventions  Encouraged to attend Cardiac Rehabilitation for the exercise       Comments Ignatz said he may ask his [facility] Doctor if they could some how allow him to extend his 36 sessions of Cardiac Rehab. Given information about Financial controller.           Vocational Rehabilitation: Provide vocational rehab assistance to qualifying candidates.   Vocational Rehab Evaluation & Intervention:   Education: Education Goals: Education classes will be provided on  a weekly basis, covering required topics. Participant will state understanding/return demonstration of topics presented.  Learning Barriers/Preferences:   Education Topics: General Nutrition Guidelines/Fats and Fiber: -Group instruction provided by verbal, written material, models and posters to present the general guidelines for heart healthy nutrition. Gives an explanation and review of dietary fats and fiber.   Controlling Sodium/Reading Food Labels: -Group verbal and written material supporting the discussion of sodium use in heart healthy nutrition. Review and explanation with models, verbal and written materials for utilization of the food label.          Cardiac Rehab from 04/28/2015 in [facility] Cardiac Rehab   Date  03/24/15   Educator  CR   Instruction Review Code  2- meets goals/outcomes      Exercise Physiology & Risk Factors: - Group verbal and written instruction with models to review the exercise physiology of the cardiovascular system and associated critical values. Details cardiovascular disease risk factors and the goals associated with each risk factor.      Cardiac Rehab from 04/28/2015 in [facility] Cardiac Rehab   Date  04/09/15   Educator  RM   Instruction Review Code  2- meets goals/outcomes      Aerobic Exercise & Resistance Training: - Gives group verbal and written discussion on the health impact of inactivity. On the components of aerobic and resistive training programs and the benefits of this training and how to  safely progress through these programs.      Cardiac Rehab from 04/28/2015 in [facility] Cardiac Rehab   Date  04/14/15   Educator  RM   Instruction Review Code  2- meets goals/outcomes      Flexibility, Balance, General Exercise Guidelines: - Provides group verbal and written instruction on the benefits of flexibility and balance training programs. Provides general exercise guidelines with specific guidelines to those with heart or lung disease. Demonstration and skill practice provided.      Cardiac Rehab from 04/28/2015 in [facility] Cardiac Rehab   Date  04/21/15   Educator  [facility]   Instruction Review Code  2- meets goals/outcomes      Stress Management: - Provides group verbal and written instruction about the health risks of elevated stress, cause of high stress, and healthy ways to reduce stress.      Cardiac Rehab from 04/28/2015 in [facility] Cardiac Rehab   Date  04/23/15   Educator  [facility]   Instruction Review Code  2- meets goals/outcomes      Depression: - Provides group verbal and written instruction on the correlation between heart/lung disease and depressed mood, treatment options, and the stigmas associated with seeking treatment.      Cardiac Rehab from 04/28/2015 in [facility] Cardiac Rehab   Date  03/26/15   Educator  [facility]   Instruction Review Code  2- meets goals/outcomes      Anatomy & Physiology of the Heart: - Group verbal and written instruction and models provide basic cardiac anatomy and physiology, with the coronary electrical and arterial systems. Review of: AMI, Angina, Valve disease, Heart Failure, Cardiac Arrhythmia, Pacemakers, and the ICD.      Cardiac Rehab from 04/28/2015 in [facility] Cardiac Rehab   Date  04/28/15   Educator  SB   Instruction Review Code  2- meets goals/outcomes      Cardiac Procedures: - Group verbal and written instruction and models to describe the testing methods done to diagnose heart disease. Reviews the outcomes of the test results. Describes the  treatment choices:  Medical Management, Angioplasty, or Coronary Bypass Surgery.      Cardiac Rehab from 04/28/2015 in [facility] Cardiac Rehab   Date  03/31/15   Educator  SB   Instruction Review Code  2- meets goals/outcomes      Cardiac Medications: - Group verbal and written instruction to review commonly prescribed medications for heart disease. Reviews the medication, class of the drug, and side effects. Includes the steps to properly store meds and maintain the prescription regimen.      Cardiac Rehab from 04/28/2015 in [facility] Cardiac Rehab   Date  04/02/15   Educator  MA   Instruction Review Code  2- meets goals/outcomes      Go Sex-Intimacy & Heart Disease, Get SMART - Goal Setting: - Group verbal and written instruction through game format to discuss heart disease and the return to sexual intimacy. Provides group verbal and written material to discuss and apply goal setting through the application of the S.M.A.R.T. Method.      Cardiac Rehab from 04/28/2015 in [facility] Cardiac Rehab   Date  03/31/15   Educator  SB   Instruction Review Code  2- meets goals/outcomes      Other Matters of the Heart: - Provides group verbal, written materials and models to describe Heart Failure, Angina, Valve Disease, and Diabetes in the realm of heart disease. Includes description of the disease process and treatment options available to the cardiac patient.   Exercise & Equipment Safety: - Individual verbal instruction and demonstration of equipment use and safety with use of the equipment.      Cardiac Rehab from 04/28/2015 in [facility] Cardiac Rehab   Date  03/26/15   Educator  RM   Instruction Review Code  2- meets goals/outcomes      Infection Prevention: - Provides verbal and written material to individual with discussion of infection control including proper hand washing and proper equipment cleaning during exercise session.      Cardiac Rehab from 04/28/2015 in [facility] Cardiac Rehab   Date  03/26/15    Educator  RM   Instruction Review Code  2- meets goals/outcomes      Falls Prevention: - Provides verbal and written material to individual with discussion of falls prevention and safety.      Cardiac Rehab from 04/28/2015 in [facility] Cardiac Rehab   Date  03/26/15   Educator  RM   Instruction Review Code  2- meets goals/outcomes      Diabetes: - Individual verbal and written instruction to review signs/symptoms of diabetes, desired ranges of glucose level fasting, after meals and with exercise. Advice that pre and post exercise glucose checks will be done for 3 sessions at entry of program.    Knowledge Questionnaire Score:   Personal Goals and Risk Factors at Admission:   Personal Goals and Risk Factors Review:      Goals and Risk Factor Review      04/14/15 1344 04/25/15 0945         Increase Aerobic Exercise and Physical Activity   Goals Progress/Improvement seen   Yes      Comments Progressing well with exercise prescription, feeling better since angioplasty which has enabled him to progress at a better pace.  Will states he feels so much better since he started Cardiac Rehab. [place] also states his is glad that he didn't need an Internal Defib like his [facility] wanted but [place] reported that Dr. Rockey Situ said that could be put on hold for now and  Randle realizes exercising in Cardiac Rehab helps him with that.      Hypertension   Goal Participant will see blood pressure controlled within the values of 140/2m/Hg or within value directed by their physician.       Progress seen toward goals Yes       Comments BP reading in normal ranges          Personal Goals Discharge (Final Personal Goals and Risk Factors Review):      Goals and Risk Factor Review - 04/25/15 0945    Increase Aerobic Exercise and Physical Activity   Goals Progress/Improvement seen  Yes   Comments Will states he feels so much better since he started Cardiac Rehab. WWestenalso states his is glad that  he didn't need an Internal Defib like his [facility] wanted but WOakleyreported that Dr. GRockey Situsaid that could be put on hold for now and WDextonrealizes exercising in Cardiac Rehab helps him with that.       Comments: Was discharged before. Dr. MSabra Heckis signing 30 day notes today.

## 2015-05-30 ENCOUNTER — Ambulatory Visit (INDEPENDENT_AMBULATORY_CARE_PROVIDER_SITE_OTHER): Payer: Non-veteran care | Admitting: Cardiovascular Disease

## 2015-05-30 ENCOUNTER — Encounter: Payer: Self-pay | Admitting: Cardiovascular Disease

## 2015-05-30 VITALS — BP 110/80 | HR 68 | Ht 66.0 in | Wt 164.0 lb

## 2015-05-30 DIAGNOSIS — I25119 Atherosclerotic heart disease of native coronary artery with unspecified angina pectoris: Secondary | ICD-10-CM | POA: Diagnosis not present

## 2015-05-30 DIAGNOSIS — Z9861 Coronary angioplasty status: Secondary | ICD-10-CM

## 2015-05-30 DIAGNOSIS — E785 Hyperlipidemia, unspecified: Secondary | ICD-10-CM

## 2015-05-30 DIAGNOSIS — I5042 Chronic combined systolic (congestive) and diastolic (congestive) heart failure: Secondary | ICD-10-CM

## 2015-05-30 DIAGNOSIS — I2510 Atherosclerotic heart disease of native coronary artery without angina pectoris: Secondary | ICD-10-CM | POA: Diagnosis not present

## 2015-05-30 DIAGNOSIS — I255 Ischemic cardiomyopathy: Secondary | ICD-10-CM

## 2015-05-30 DIAGNOSIS — R0602 Shortness of breath: Secondary | ICD-10-CM

## 2015-05-30 NOTE — Progress Notes (Signed)
Patient ID: Kyle Morton, male    DOB: July 08, 1951, 64 y.o.   MRN: [id number]  HPI Comments: Mr. Bayron is a very pleasant 64 year old gentleman with a history of coronary artery disease, xience stent placed to his diagonal #1 in 2007, 2.5 x 15 mm stent placed to his OM 3 in 2013, with 3 stents placed in 2015 previously  seen for shortness of breath, chronic systolic CHF. Baseline ejection fraction  previously 30-35%, repeat echocardiogram recently showing ejection fraction 15%  Catheterization by Dr. Kirke Corin in June 2016 showing occluded RCA, 60% proximal LAD disease, 90% ostial diagonal disease, no intervention done at that time secondary to the difficulty of the lesion and acute on chronic systolic CHF. He underwent aggressive diuresis with improvement of his shortness of breath symptoms Presenting to the hospital last week 01/28/2015 with lightheadedness, found to be dehydrated with creatinine 1.9. He had been very strict with his by mouth intake, taking torsemide 20 mg daily with spironolactone. Potassium was 3.2. He presents today for follow-up of his chronic systolic CHF  In follow-up, he reports that he feels well. His weight has increased dramatically but he denies any lower extremity edema, no abdominal swelling. He has been eating more Ideal weight in the past was 148 pounds. He reports weight at home 158 pounds, weight in our office 164 pounds Previously weight in our office was 152 pounds Denies any exercise and otherwise feels well with no complaints He's not been taking torsemide, bony occasionally for weight of 155 pounds or more. Did not take 1 today with weight 158 pounds He denies any symptoms concerning for angina  EKG on today's visit shows normal sinus rhythm with rates cc 8 bpm, old anterior MI, left axis deviation, nonspecific ST and T wave abnormality anterolateral leads  Other past medical history On his initial visit, he was having fatigue, shortness of breath, cough with  sputum. Symptoms got worse in December and January 2016. He was started on antibiotics with some improvement of his symptoms. Also given prednisone, albuterol inhaler, medications for his sinuses, Mucinex. He continues to have tightness in his chest, shortness of breath, occasional cough.       No Known Allergies  Outpatient Encounter Prescriptions as of 05/30/2015  Medication Sig  . allopurinol (ZYLOPRIM) 100 MG tablet Take 200 mg by mouth daily.   Marland Kitchen aspirin EC 81 MG tablet Take 81 mg by mouth daily.  Marland Kitchen atorvastatin (LIPITOR) 80 MG tablet Take 80 mg by mouth daily.  . carvedilol (COREG) 12.5 MG tablet Take 12.5 mg by mouth 2 (two) times daily with a meal.  . cetirizine (ZYRTEC) 10 MG tablet Take 10 mg by mouth daily.  . clopidogrel (PLAVIX) 75 MG tablet Take 1 tablet (75 mg total) by mouth daily.  . colchicine 0.6 MG tablet Take 0.6 mg by mouth as needed.   Marland Kitchen dextrose (GLUTOSE) 40 % GEL Take 1 Tube by mouth once as needed for low blood sugar.  . fluticasone (FLONASE) 50 MCG/ACT nasal spray Place 2 sprays into both nostrils daily as needed for allergies or rhinitis.   Marland Kitchen Hexylresorcinol (THROAT LOZENGES MT) Use as directed 1 Dose in the mouth or throat every 4 (four) hours as needed (for sore throat).  . insulin aspart (NOVOLOG) 100 UNIT/ML injection Inject 10 Units into the skin 3 (three) times daily before meals.  . insulin glargine (LANTUS) 100 UNIT/ML injection Inject 25 Units into the skin at bedtime.  . isosorbide mononitrate (IMDUR) 60 MG 24  hr tablet Take 1 tablet (60 mg total) by mouth daily.  . Menthol-Methyl Salicylate (THERA-GESIC EX) Apply 1 Dose topically 2 (two) times daily.   . nitroGLYCERIN (NITROSTAT) 0.4 MG SL tablet Place 1 tablet (0.4 mg total) under the tongue every 5 (five) minutes as needed for chest pain.  . potassium chloride SA (K-DUR,KLOR-CON) 20 MEQ tablet Take 1 tablet (20 mEq total) by mouth 2 (two) times daily.  . sacubitril-valsartan (ENTRESTO) 24-26 MG Take  1 tablet by mouth 2 (two) times daily.  . sodium fluoride (LURIDE) 1.1 (0.5 F) MG/ML SOLN Take 1 drop by mouth daily as needed.  Marland Kitchen spironolactone (ALDACTONE) 25 MG tablet Take 25 mg by mouth daily.   Marland Kitchen torsemide (DEMADEX) 20 MG tablet Take 1 tablet (20 mg total) by mouth daily. Take  daily by mouth for now. Hold dose if weight is less than 146 pounds.  . traZODone (DESYREL) 100 MG tablet Take 100 mg by mouth at bedtime.  Marland Kitchen venlafaxine XR (EFFEXOR-XR) 150 MG 24 hr capsule Take 150 mg by mouth daily with breakfast.  . Vitamin D, Cholecalciferol, 1000 UNITS CAPS Take 2,000 Units by mouth daily.  . [DISCONTINUED] isosorbide mononitrate (IMDUR) 60 MG 24 hr tablet Take 0.5 tablets (30 mg total) by mouth daily.  . [DISCONTINUED] torsemide (DEMADEX) 20 MG tablet Take 10 mg by mouth daily. Take  daily by mouth for now. Hold dose if weight is less than 146 pounds.   No facility-administered encounter medications on file as of 05/30/2015.    Past Medical History  Diagnosis Date  . Pleural effusion   . Chronic combined systolic and diastolic CHF (congestive heart failure) (HCC)     a. reported baseline EF 30-35%; b. echo 12/2014: EF 20-25%, diffuse HK, restrictive filling pattern, severely dilated LA, mod MR/TR/PR, PASP 70 mm Hg, IVC dilted c/w elevated CVP. c. EF 15% by cath in 01/2015.  Marland Kitchen Coronary artery disease     a. xience stent to D1 in 2007; 2.5 x 15 mm stent to OM3 in 2013, w/ 3 stents in 2015 (details not available);  b. 01/2015 Cath: LM nl, LAD 60p, 30m ISR, D1 95ost, 20 ISR, RI min irregs, LCX 20 ISR, OM1 small, OM2 30 ISR, OM3 40 ISR, RCA 100p, EF 15%, 2+ MR, elev R heart pres. c. 01/2015: s/p complex LAD diagonal bifurcation angioplasty/stenting with balloon angioplasty of the D1 & DES to prox LAD.  Marland Kitchen Benign essential HTN   . Hyperlipidemia   . DM2 (diabetes mellitus, type 2) (HCC)   . PVC's (premature ventricular contractions)   . Vitamin D deficiency   . GERD (gastroesophageal reflux  disease)   . Carotid artery stenosis     a. carotid dopplers 12/2014: less than 39% ICA bilaterally, f/u 1 year  . Depression   . Gout     feet   . CKD (chronic kidney disease), stage III   . QT prolongation   . Ischemic cardiomyopathy     Past Surgical History  Procedure Laterality Date  . Coronary stent placement  02/19/2015    lad   . Cardiac catheterization  2013,2007  . Coronary angioplasty with stent placement  06/05/2012    OM3  . Coronary angioplasty with stent placement  04/25/2006    RAD  . Cardiac catheterization N/A 01/07/2015    Procedure: Right and Left Heart Cath;  Surgeon: Iran Ouch, MD;  Location: [facility] INVASIVE CV LAB;  Service: Cardiovascular;  Laterality: N/A;  . Cardiac catheterization  N/A 02/19/2015    Procedure: Coronary Stent Intervention;  Surgeon: Iran OuchMuhammad A Arida, MD;  Location: [facility] INVASIVE CV LAB;  Service: Cardiovascular;  Laterality: N/A;  . Coronary angioplasty  02/19/2015    Social History  reports that he has never smoked. He has never used smokeless tobacco. He reports that he does not drink alcohol or use illicit drugs.  Family History family history includes Hypertension in his brother, father, and mother.   Review of Systems  Constitutional: Positive for unexpected weight change.  Respiratory: Negative.   Cardiovascular: Negative.   Gastrointestinal: Negative.   Musculoskeletal: Negative.   Skin: Positive for rash.  Neurological: Negative.   Hematological: Negative.   Psychiatric/Behavioral: Negative.   All other systems reviewed and are negative.   BP 110/80 mmHg  Pulse 68  Ht 5\' 6"  (1.676 m)  Wt 164 lb (74.39 kg)  BMI 26.48 kg/m2  Physical Exam  Constitutional: He is oriented to person, place, and time. He appears well-developed and well-nourished.  HENT:  Head: Normocephalic.  Nose: Nose normal.  Mouth/Throat: Oropharynx is clear and moist.  Eyes: Conjunctivae are normal. Pupils are equal, round, and reactive to light.   Neck: Normal range of motion. Neck supple. No JVD present.  Cardiovascular: Normal rate, regular rhythm, S1 normal, S2 normal, normal heart sounds and intact distal pulses.  Exam reveals no gallop and no friction rub.   No murmur heard. Trace edema  Pulmonary/Chest: Effort normal and breath sounds normal. No respiratory distress. He has no wheezes. He has no rales. He exhibits no tenderness.  Abdominal: Soft. Bowel sounds are normal. He exhibits no distension. There is no tenderness.  Musculoskeletal: Normal range of motion. He exhibits no edema or tenderness.  Lymphadenopathy:    He has no cervical adenopathy.  Neurological: He is alert and oriented to person, place, and time. Coordination normal.  Skin: Skin is warm and dry. No rash noted. No erythema.  Psychiatric: He has a normal mood and affect. His behavior is normal. Judgment and thought content normal.      Assessment and Plan   Nursing note and vitals reviewed.

## 2015-05-30 NOTE — Assessment & Plan Note (Signed)
Echocardiogram ordered to evaluate ejection fraction If this continues to run low, will refer to EP for consideration of ICD Suggested he restart his entresto. He has not been taking this

## 2015-05-30 NOTE — Assessment & Plan Note (Signed)
Encouraged him to restart his torsemide 20 mg daily for weight more than 155 pounds Weight currently 158 pounds at home, weight in our office 164 pounds I suspect some of his weight gain is from high calorie intake Appears relatively euvolemic with scant crackles at the bases Repeat echocardiogram ordered to evaluate ejection fraction

## 2015-05-30 NOTE — Assessment & Plan Note (Signed)
Currently with no symptoms of angina. No further workup at this time. Continue current medication regimen. 

## 2015-05-30 NOTE — Assessment & Plan Note (Signed)
Encouraged him to stay on his aspirin and Plavix Currently with no symptoms of angina

## 2015-05-30 NOTE — Assessment & Plan Note (Addendum)
Encouraged him to stay on his Lipitor. Stressed importance of weight reduction

## 2015-05-30 NOTE — Patient Instructions (Addendum)
You are doing well. Weight is up, some food, some fluid  No medication changes were made. Take torsemide for weight 155 or more, Or anytime you feel shortness of breath  We will schedule you for an Echo in about 2 weeks  __________________________________________  Please call us if you have new issues that need to be addressed before your next appt.  Your physician wants you to follow-up in: 6 months.  You will receive a reminder letter in the mail two months in advance. If you don't receive a letter, please call our office to schedule the follow-up appointment.  Echocardiogram An echocardiogram, or echocardiography, uses sound waves (ultrasound) to produce an image of your heart. The echocardiogram is simple, painless, obtained within a short period of time, and offers valuable information to your health care provider. The images from an echocardiogram can provide information such as:  Evidence of coronary artery disease (CAD).  Heart size.  Heart muscle function.  Heart valve function.  Aneurysm detection.  Evidence of a past heart attack.  Fluid buildup around the heart.  Heart muscle thickening.  Assess heart valve function. LET [facility] CARE PROVIDER KNOW ABOUT:  Any allergies you have.  All medicines you are taking, including vitamins, herbs, eye drops, creams, and over-the-counter medicines.  Previous problems you or members of your family have had with the use of anesthetics.  Any blood disorders you have.  Previous surgeries you have had.  Medical conditions you have.  Possibility of pregnancy, if this applies. BEFORE THE PROCEDURE  No special preparation is needed. Eat and drink normally.  PROCEDURE   In order to produce an image of your heart, gel will be applied to your chest and a wand-like tool (transducer) will be moved over your chest. The gel will help transmit the sound waves from the transducer. The sound waves will harmlessly bounce off  your heart to allow the heart images to be captured in real-time motion. These images will then be recorded.  You may need an IV to receive a medicine that improves the quality of the pictures. AFTER THE PROCEDURE You may return to your normal schedule including diet, activities, and medicines, unless your health care provider tells you otherwise.   This information is not intended to replace advice given to you by your health care provider. Make sure you discuss any questions you have with your health care provider.   Document Released: 07/16/2000 Document Revised: 08/09/2014 Document Reviewed: 03/26/2013 Elsevier Interactive Patient Education 2016 [employer]   Heart Failure Heart failure is a condition in which the heart has trouble pumping blood. This means your heart does not pump blood efficiently for your body to work well. In some cases of heart failure, fluid may back up into your lungs or you may have swelling (edema) in your lower legs. Heart failure is usually a long-term (chronic) condition. It is important for you to take good care of yourself and follow your health care provider's treatment plan. CAUSES  Some health conditions can cause heart failure. Those health conditions include:  High blood pressure (hypertension). Hypertension causes the heart muscle to work harder than normal. When pressure in the blood vessels is high, the heart needs to pump (contract) with more force in order to circulate blood throughout the body. High blood pressure eventually causes the heart to become stiff and weak.  Coronary artery disease (CAD). CAD is the buildup of cholesterol and fat (plaque) in the arteries of the heart. The blockage  in the arteries deprives the heart muscle of oxygen and blood. This can cause chest pain and may lead to a heart attack. High blood pressure can also contribute to CAD.  Heart attack (myocardial infarction). A heart attack occurs when one or more arteries in  the heart become blocked. The loss of oxygen damages the muscle tissue of the heart. When this happens, part of the heart muscle dies. The injured tissue does not contract as well and weakens the heart's ability to pump blood.  Abnormal heart valves. When the heart valves do not open and close properly, it can cause heart failure. This makes the heart muscle pump harder to keep the blood flowing.  Heart muscle disease (cardiomyopathy or myocarditis). Heart muscle disease is damage to the heart muscle from a variety of causes. These can include drug or alcohol abuse, infections, or unknown reasons. These can increase the risk of heart failure.  Lung disease. Lung disease makes the heart work harder because the lungs do not work properly. This can cause a strain on the heart, leading it to fail.  Diabetes. Diabetes increases the risk of heart failure. High blood sugar contributes to high fat (lipid) levels in the blood. Diabetes can also cause slow damage to tiny blood vessels that carry important nutrients to the heart muscle. When the heart does not get enough oxygen and food, it can cause the heart to become weak and stiff. This leads to a heart that does not contract efficiently.  Other conditions can contribute to heart failure. These include abnormal heart rhythms, thyroid problems, and low blood counts (anemia). Certain unhealthy behaviors can increase the risk of heart failure, including:  Being overweight.  Smoking or chewing tobacco.  Eating foods high in fat and cholesterol.  Abusing illicit drugs or alcohol.  Lacking physical activity. SYMPTOMS  Heart failure symptoms may vary and can be hard to detect. Symptoms may include:  Shortness of breath with activity, such as climbing stairs.  Persistent cough.  Swelling of the feet, ankles, legs, or abdomen.  Unexplained weight gain.  Difficulty breathing when lying flat (orthopnea).  Waking from sleep because of the need to sit  up and get more air.  Rapid heartbeat.  Fatigue and loss of energy.  Feeling light-headed, dizzy, or close to fainting.  Loss of appetite.  Nausea.  Increased urination during the night (nocturia). DIAGNOSIS  A diagnosis of heart failure is based on your history, symptoms, physical examination, and diagnostic tests. Diagnostic tests for heart failure may include:  Echocardiography.  Electrocardiography.  Chest X-ray.  Blood tests.  Exercise stress test.  Cardiac angiography.  Radionuclide scans. TREATMENT  Treatment is aimed at managing the symptoms of heart failure. Medicines, behavioral changes, or surgical intervention may be necessary to treat heart failure.  Medicines to help treat heart failure may include:  Angiotensin-converting enzyme (ACE) inhibitors. This type of medicine blocks the effects of a blood protein called angiotensin-converting enzyme. ACE inhibitors relax (dilate) the blood vessels and help lower blood pressure.  Angiotensin receptor blockers (ARBs). This type of medicine blocks the actions of a blood protein called angiotensin. Angiotensin receptor blockers dilate the blood vessels and help lower blood pressure.  Water pills (diuretics). Diuretics cause the kidneys to remove salt and water from the blood. The extra fluid is removed through urination. This loss of extra fluid lowers the volume of blood the heart pumps.  Beta blockers. These prevent the heart from beating too fast and improve heart muscle strength.  Digitalis. This increases the force of the heartbeat.  Healthy behavior changes include:  Obtaining and maintaining a healthy weight.  Stopping smoking or chewing tobacco.  Eating heart-healthy foods.  Limiting or avoiding alcohol.  Stopping illicit drug use.  Physical activity as directed by your health care provider.  Surgical treatment for heart failure may include:  A procedure to open blocked arteries, repair damaged  heart valves, or remove damaged heart muscle tissue.  A pacemaker to improve heart muscle function and control certain abnormal heart rhythms.  An internal cardioverter defibrillator to treat certain serious abnormal heart rhythms.  A left ventricular assist device (LVAD) to assist the pumping ability of the heart. HOME CARE INSTRUCTIONS   Take medicines only as directed by your health care provider. Medicines are important in reducing the workload of your heart, slowing the progression of heart failure, and improving your symptoms.  Do not stop taking your medicine unless directed by your health care provider.  Do not skip any dose of medicine.  Refill your prescriptions before you run out of medicine. Your medicines are needed every day.  Engage in moderate physical activity if directed by your health care provider. Moderate physical activity can benefit some people. The elderly and people with severe heart failure should consult with a health care provider for physical activity recommendations.  Eat heart-healthy foods. Food choices should be free of trans fat and low in saturated fat, cholesterol, and salt (sodium). Healthy choices include fresh or frozen fruits and vegetables, fish, lean meats, legumes, fat-free or low-fat dairy products, and whole grain or high fiber foods. Talk to a dietitian to learn more about heart-healthy foods.  Limit sodium if directed by your health care provider. Sodium restriction may reduce symptoms of heart failure in some people. Talk to a dietitian to learn more about heart-healthy seasonings.  Use healthy cooking methods. Healthy cooking methods include roasting, grilling, broiling, baking, poaching, steaming, or stir-frying. Talk to a dietitian to learn more about healthy cooking methods.  Limit fluids if directed by your health care provider. Fluid restriction may reduce symptoms of heart failure in some people.  Weigh yourself every day. Daily  weights are important in the early recognition of excess fluid. You should weigh yourself every morning after you urinate and before you eat breakfast. Wear the same amount of clothing each time you weigh yourself. Record your daily weight. Provide your health care provider with your weight record.  Monitor and record your blood pressure if directed by your health care provider.  Check your pulse if directed by your health care provider.  Lose weight if directed by your health care provider. Weight loss may reduce symptoms of heart failure in some people.  Stop smoking or chewing tobacco. Nicotine makes your heart work harder by causing your blood vessels to constrict. Do not use nicotine gum or patches before talking to your health care provider.  Keep all follow-up visits as directed by your health care provider. This is important.  Limit alcohol intake to no more than 1 drink per day for nonpregnant women and 2 drinks per day for men. One drink equals 12 ounces of beer, 5 ounces of wine, or 1 ounces of hard liquor. Drinking more than that is harmful to your heart. Tell your health care provider if you drink alcohol several times a week. Talk with your health care provider about whether alcohol is safe for you. If your heart has already been damaged by alcohol or you have  severe heart failure, drinking alcohol should be stopped completely.  Stop illicit drug use.  Stay up-to-date with immunizations. It is especially important to prevent respiratory infections through current pneumococcal and influenza immunizations.  Manage other health conditions such as hypertension, diabetes, thyroid disease, or abnormal heart rhythms as directed by your health care provider.  Learn to manage stress.  Plan rest periods when fatigued.  Learn strategies to manage high temperatures. If the weather is extremely hot:  Avoid vigorous physical activity.  Use air conditioning or fans or seek a cooler  location.  Avoid caffeine and alcohol.  Wear loose-fitting, lightweight, and light-colored clothing.  Learn strategies to manage cold temperatures. If the weather is extremely cold:  Avoid vigorous physical activity.  Layer clothes.  Wear mittens or gloves, a hat, and a scarf when going outside.  Avoid alcohol.  Obtain ongoing education and support as needed.  Participate in or seek rehabilitation as needed to maintain or improve independence and quality of life. SEEK MEDICAL CARE IF:   You have a rapid weight gain.  You have increasing shortness of breath that is unusual for you.  You are unable to participate in your usual physical activities.  You tire easily.  You cough more than normal, especially with physical activity.  You have any or more swelling in areas such as your hands, feet, ankles, or abdomen.  You are unable to sleep because it is hard to breathe.  You feel like your heart is beating fast (palpitations).  You become dizzy or light-headed upon standing up. SEEK IMMEDIATE MEDICAL CARE IF:   You have difficulty breathing.  There is a change in mental status such as decreased alertness or difficulty with concentration.  You have a pain or discomfort in your chest.  You have an episode of fainting (syncope). MAKE SURE YOU:   Understand these instructions.  Will watch your condition.  Will get help right away if you are not doing well or get worse.   This information is not intended to replace advice given to you by your health care provider. Make sure you discuss any questions you have with your health care provider.   Document Released: 07/19/2005 Document Revised: 12/03/2014 Document Reviewed: 08/18/2012 Elsevier Interactive Patient Education 2016 [employer] Inc[employer] Edema Edema is an abnormal buildup of fluids in your bodytissues. Edema is somewhatdependent on gravity to pull the fluid to the lowest place in your body. That makes the condition  more common in the legs and thighs (lower extremities). Painless swelling of the feet and ankles is common and becomes more likely as you get older. It is also common in looser tissues, like around your eyes.  When the affected area is squeezed, the fluid may move out of that spot and leave a dent for a few moments. This dent is called pitting.  CAUSES  There are many possible causes of edema. Eating too much salt and being on your feet or sitting for a long time can cause edema in your legs and ankles. Hot weather may make edema worse. Common medical causes of edema include:  Heart failure.  Liver disease.  Kidney disease.  Weak blood vessels in your legs.  Cancer.  An injury.  Pregnancy.  Some medications.  Obesity. SYMPTOMS  Edema is usually painless.Your skin may look swollen or shiny.  DIAGNOSIS  Your health care provider may be able to diagnose edema by asking about your medical history and doing a physical exam. You may need to have  tests such as X-rays, an electrocardiogram, or blood tests to check for medical conditions that may cause edema.  TREATMENT  Edema treatment depends on the cause. If you have heart, liver, or kidney disease, you need the treatment appropriate for these conditions. General treatment may include:  Elevation of the affected body part above the level of your heart.  Compression of the affected body part. Pressure from elastic bandages or support stockings squeezes the tissues and forces fluid back into the blood vessels. This keeps fluid from entering the tissues.  Restriction of fluid and salt intake.  Use of a water pill (diuretic). These medications are appropriate only for some types of edema. They pull fluid out of your body and make you urinate more often. This gets rid of fluid and reduces swelling, but diuretics can have side effects. Only use diuretics as directed by your health care provider. HOME CARE INSTRUCTIONS   Keep the affected  body part above the level of your heart when you are lying down.   Do not sit still or stand for prolonged periods.   Do not put anything directly under your knees when lying down.  Do not wear constricting clothing or garters on your upper legs.   Exercise your legs to work the fluid back into your blood vessels. This may help the swelling go down.   Wear elastic bandages or support stockings to reduce ankle swelling as directed by your health care provider.   Eat a low-salt diet to reduce fluid if your health care provider recommends it.   Only take medicines as directed by your health care provider. SEEK MEDICAL CARE IF:   Your edema is not responding to treatment.  You have heart, liver, or kidney disease and notice symptoms of edema.  You have edema in your legs that does not improve after elevating them.   You have sudden and unexplained weight gain. SEEK IMMEDIATE MEDICAL CARE IF:   You develop shortness of breath or chest pain.   You cannot breathe when you lie down.  You develop pain, redness, or warmth in the swollen areas.   You have heart, liver, or kidney disease and suddenly get edema.  You have a fever and your symptoms suddenly get worse. MAKE SURE YOU:   Understand these instructions.  Will watch your condition.  Will get help right away if you are not doing well or get worse.   This information is not intended to replace advice given to you by your health care provider. Make sure you discuss any questions you have with your health care provider.   Document Released: 07/19/2005 Document Revised: 08/09/2014 Document Reviewed: 05/11/2013 Elsevier Interactive Patient Education 2016 [employer] Torsemide tablets What is this medicine? TORSEMIDE (TORE se mide) is a diuretic. It helps you make more urine and to lose salt and excess water from your body. This medicine is used to treat high blood pressure, and edema or swelling from heart,  kidney, or liver disease. This medicine may be used for other purposes; ask your health care provider or pharmacist if you have questions. What should I tell my health care provider before I take this medicine? They need to know if you have any of these conditions: -abnormal blood electrolytes -diabetes -gout -heart disease -kidney disease -liver disease -small amounts of urine, or difficulty passing urine -an unusual or allergic reaction to torsemide, sulfa drugs, other medicines, foods, dyes, or preservatives -pregnant or trying to get pregnant -breast-feeding How should I  use this medicine? Take this medicine by mouth with a glass of water. Follow the directions on the prescription label. You may take this medicine with or without food. If it upsets your stomach, take it with food or milk. Do not take your medicine more often than directed. Remember that you will need to pass more urine after taking this medicine. Do not take your medicine at a time of day that will cause you problems. Do not take at bedtime. Talk to your pediatrician regarding the use of this medicine in children. Special care may be needed. Overdosage: If you think you have taken too much of this medicine contact a poison control center or emergency room at once. NOTE: This medicine is only for you. Do not share this medicine with others. What if I miss a dose? If you miss a dose, take it as soon as you can. If it is almost time for your next dose, take only that dose. Do not take double or extra doses. What may interact with this medicine? -alcohol -certain antibiotics given by injection certain heart medicines like digoxin -diuretics -lithium -medicines for diabetes -medicines for blood pressure -medicines for cholesterol like cholestyramine -medicines that relax muscles for surgery -NSAIDs, medicines for pain and inflammation, like ibuprofen or naproxen -OTC supplements like ginseng and  ephedra -probenecid -steroid medicines like prednisone or cortisone This list may not describe all possible interactions. Give your health care provider a list of all the medicines, herbs, non-prescription drugs, or dietary supplements you use. Also tell them if you smoke, drink alcohol, or use illegal drugs. Some items may interact with your medicine. What should I watch for while using this medicine? Visit your doctor or health care professional for regular checks on your progress. Check your blood pressure regularly. Ask your doctor or health care professional what your blood pressure should be, and when you should contact him or her. If you are a diabetic, check your blood sugar as directed. You may need to be on a special diet while taking this medicine. Check with your doctor. Also, ask how many glasses of fluid you need to drink a day. You must not get dehydrated. You may get drowsy or dizzy. Do not drive, use machinery, or do anything that needs mental alertness until you know how this drug affects you. Do not stand or sit up quickly, especially if you are an older patient. This reduces the risk of dizzy or fainting spells. Alcohol can make you more drowsy and dizzy. Avoid alcoholic drinks. What side effects may I notice from receiving this medicine? Side effects that you should report to your doctor or health care professional as soon as possible: -allergic reactions such as skin rash or itching, hives, swelling of the lips, mouth, tongue or throat -blood in urine or stool -dry mouth -hearing loss or ringing in the ears -irregular heartbeat -muscle pain, weakness or cramps -pain or difficulty passing urine -unusually weak or tired -vomiting or diarrhea Side effects that usually do not require medical attention (report to your doctor or health care professional if they continue or are bothersome): -dizzy or lightheaded -headache -increased thirst -passing large amounts of  urine -sexual difficulties -stomach pain, upset or nausea This list may not describe all possible side effects. Call your doctor for medical advice about side effects. You may report side effects to FDA at 1-800-FDA-1088. Where should I keep my medicine? Keep out of the reach of children. Store at room temperature between 15 and  30 degrees C (59 and 86 degrees F). Throw away any unused medicine after the expiration date. NOTE: This sheet is a summary. It may not cover all possible information. If you have questions about this medicine, talk to your doctor, pharmacist, or health care provider.    2016, [employer]vier[employer]. (2008-04-04 11:35:45)

## 2015-06-23 ENCOUNTER — Encounter: Payer: Self-pay | Admitting: *Deleted

## 2015-06-23 ENCOUNTER — Other Ambulatory Visit: Payer: Medicare Other

## 2015-06-23 DIAGNOSIS — R0989 Other specified symptoms and signs involving the circulatory and respiratory systems: Secondary | ICD-10-CM

## 2015-06-24 NOTE — Progress Notes (Signed)
Discharge Summary  Patient Details  Name: Kyle Morton MRN: [id number] Date of Birth: 02/19/1951 Ref[facility] Provider:  [facility]   Number of Visits: [phone]  Reason for Discharge:  Patient reached a stable level of exercise. Patient independent in their exercise.  Smoking History:  History  Smoking status  . Never Smoker   Smokeless tobacco  . Never Used    Diagnosis:  S/P PTCA (percutaneous transluminal coronary angioplasty)  ADL UCSD:   Initial Exercise Prescription:   Discharge Exercise Prescription (Final Exercise Prescription Changes):     Exercise Prescription Changes - 04/28/15 0800    Exercise Review   Progression Yes   Response to Exercise   Blood Pressure (Admit) 118/64 mmHg   Blood Pressure (Exercise) 142/90 mmHg   Blood Pressure (Exit) 130/84 mmHg   Heart Rate (Admit) 81 bpm   Heart Rate (Exercise) 95 bpm   Heart Rate (Exit) 84 bpm   Rating of Perceived Exertion (Exercise) 13   Symptoms None   Duration Progress to 30 minutes of continuous aerobic without signs/symptoms of physical distress   Intensity Rest + 30   Progression Continue progressive overload as per policy without signs/symptoms or physical distress.   Resistance Training   Training Prescription Yes   Weight 3   Reps 10-15   Interval Training   Interval Training Yes   Equipment REL-XR   NuStep   Level 3   Watts 35   Minutes 25   REL-XR   Level 5   Watts 60   Minutes 30   Home Exercise Plan   Plans to continue exercise at Home  walking at home and the mall      Functional Capacity:     6 Minute Walk      04/28/15 0826       6 Minute Walk   Phase Discharge     Distance 1250 feet     Distance % Change 257 %     Walk Time 6 minutes     Resting HR 82 bpm     Resting BP 110/64 mmHg     Max Ex. HR 101 bpm     Max Ex. BP 148/70 mmHg     RPE 13     Symptoms No        Psychological, QOL, Others - Outcomes: PHQ 2/9: No flowsheet data found.  Quality of  Life:   Personal Goals: Goals established at orientation with interventions provided to work toward goal.    Personal Goals Discharge:     Goals and Risk Factor Review      03/19/15 1037 04/14/15 1344 04/25/15 0945       Increase Aerobic Exercise and Physical Activity   Goals Progress/Improvement seen  Yes  Yes     Comments Finally able to return to Cardiac Rehab.  Progressing well with exercise prescription, feeling better since angioplasty which has enabled him to progress at a better pace.  Will states he feels so much better since he started Cardiac Rehab. Ivar DrapeWillie also states his is glad that he didn't need an Internal Defib like his [facility] wanted but Ivar DrapeWillie reported that Dr. Mariah MillingGollan said that could be put on hold for now and Ivar DrapeWillie realizes exercising in Cardiac Rehab helps him with that.     Hypertension   Goal  Participant will see blood pressure controlled within the values of 140/7090mm/Hg or within value directed by their physician.      Progress seen toward goals  Yes      Comments  BP reading in normal ranges         Nutrition & Weight - Outcomes:      Post Biometrics - 04/28/15 0824     Post  Biometrics   Height 5' 6.25" (1.683 m)   Weight 159 lb (72.122 kg)   Waist Circumference 35.25 inches   Hip Circumference 37.35 inches   Waist to Hip Ratio 0.94 %   BMI (Calculated) 25.5      Nutrition:   Nutrition Discharge:   Education Questionnaire Score:

## 2015-07-24 ENCOUNTER — Other Ambulatory Visit: Payer: Self-pay

## 2015-07-24 ENCOUNTER — Ambulatory Visit (INDEPENDENT_AMBULATORY_CARE_PROVIDER_SITE_OTHER): Payer: Non-veteran care

## 2015-07-24 DIAGNOSIS — R0602 Shortness of breath: Secondary | ICD-10-CM | POA: Diagnosis not present

## 2015-07-24 DIAGNOSIS — Z9861 Coronary angioplasty status: Secondary | ICD-10-CM | POA: Diagnosis not present

## 2015-07-24 DIAGNOSIS — I2510 Atherosclerotic heart disease of native coronary artery without angina pectoris: Secondary | ICD-10-CM | POA: Diagnosis not present

## 2015-07-31 ENCOUNTER — Encounter: Payer: Self-pay | Admitting: *Deleted

## 2015-07-31 NOTE — Progress Notes (Signed)
Called 2x but vm was not set up, sending letter to patient

## 2015-11-10 ENCOUNTER — Telehealth: Payer: Self-pay | Admitting: Cardiovascular Disease

## 2015-11-10 NOTE — Telephone Encounter (Signed)
Tried to call patient to make Fu from [facility] and per Dr Mariah MillingGollan patient needs apt with Dr Graciela HusbandsKlein for possible ICD placement.

## 2015-11-19 NOTE — Telephone Encounter (Signed)
Tried to call patient to make fu (see below) vm was not set up will try again at a later time.

## 2016-01-20 ENCOUNTER — Telehealth: Payer: Self-pay | Admitting: Cardiovascular Disease

## 2016-01-20 NOTE — Telephone Encounter (Signed)
Lmom to call our office for a Carotid U/s (1 yr f/u). He needs to make sure his [facility] referral is up to date with Dr. Mariah MillingGollan.

## 2016-01-27 ENCOUNTER — Encounter: Payer: Self-pay | Admitting: *Deleted

## 2016-03-19 ENCOUNTER — Ambulatory Visit: Payer: Medicare Other | Admitting: Nurse Practitioner

## 2016-03-19 ENCOUNTER — Telehealth: Payer: Self-pay | Admitting: Cardiovascular Disease

## 2016-03-19 NOTE — Telephone Encounter (Signed)
Mr. Kyle Morton needs an appt.  He is almost 5 months overdue for appt w/ Dr. Mariah MillingGollan and it looks like he never made appt w/ Dr. Graciela HusbandsKlein as recommended. If we can't get him in to see anybody soon, I'll put him on my wait list in case of a cancellation.

## 2016-03-19 NOTE — Telephone Encounter (Signed)
Patient called back, states he is unable to take appointment today at 2:30 with Nicolasa Duckinghristopher Berge, NP. States he is waiting on his auth from [facility]. He states he called [facility] and they advised him to contact his PCP(with the [facility]) and then they will send the authorization for his appointment on 9/8.

## 2016-03-19 NOTE — Telephone Encounter (Signed)
Pt states he has fluid and persistent cough. States there has been a "mix up". States the [facility] has messed up everything. States he has has some swelling in his ankles. States he does have SOB at night and some during the day. Pt would like a nurse to talk to him. He also states he would rather stay with [facility], and not go back to the [facility].

## 2016-04-06 ENCOUNTER — Emergency Department: Payer: Non-veteran care

## 2016-04-06 ENCOUNTER — Inpatient Hospital Stay
Admission: EM | Admit: 2016-04-06 | Discharge: 2016-04-12 | DRG: 291 | Disposition: A | Discharge status: Other Acute Inpatient Hospital | Payer: Non-veteran care | Attending: Specialist | Admitting: Specialist

## 2016-04-06 ENCOUNTER — Encounter: Payer: Self-pay | Admitting: *Deleted

## 2016-04-06 ENCOUNTER — Inpatient Hospital Stay (HOSPITAL_COMMUNITY)
Admit: 2016-04-06 | Discharge: 2016-04-06 | Disposition: A | Discharge status: Home / Self Care | Payer: Non-veteran care | Attending: Internal Medicine | Admitting: Internal Medicine

## 2016-04-06 DIAGNOSIS — N183 Chronic kidney disease, stage 3 unspecified: Secondary | ICD-10-CM | POA: Diagnosis present

## 2016-04-06 DIAGNOSIS — Z9861 Coronary angioplasty status: Secondary | ICD-10-CM

## 2016-04-06 DIAGNOSIS — E785 Hyperlipidemia, unspecified: Secondary | ICD-10-CM | POA: Diagnosis present

## 2016-04-06 DIAGNOSIS — Z6826 Body mass index (BMI) 26.0-26.9, adult: Secondary | ICD-10-CM

## 2016-04-06 DIAGNOSIS — E46 Unspecified protein-calorie malnutrition: Secondary | ICD-10-CM | POA: Diagnosis present

## 2016-04-06 DIAGNOSIS — Z79899 Other long term (current) drug therapy: Secondary | ICD-10-CM | POA: Diagnosis not present

## 2016-04-06 DIAGNOSIS — I959 Hypotension, unspecified: Secondary | ICD-10-CM

## 2016-04-06 DIAGNOSIS — I130 Hypertensive heart and chronic kidney disease with heart failure and stage 1 through stage 4 chronic kidney disease, or unspecified chronic kidney disease: Secondary | ICD-10-CM | POA: Diagnosis present

## 2016-04-06 DIAGNOSIS — N179 Acute kidney failure, unspecified: Secondary | ICD-10-CM | POA: Diagnosis not present

## 2016-04-06 DIAGNOSIS — Z955 Presence of coronary angioplasty implant and graft: Secondary | ICD-10-CM

## 2016-04-06 DIAGNOSIS — I5043 Acute on chronic combined systolic (congestive) and diastolic (congestive) heart failure: Secondary | ICD-10-CM | POA: Diagnosis present

## 2016-04-06 DIAGNOSIS — R0602 Shortness of breath: Secondary | ICD-10-CM | POA: Diagnosis present

## 2016-04-06 DIAGNOSIS — M109 Gout, unspecified: Secondary | ICD-10-CM | POA: Diagnosis present

## 2016-04-06 DIAGNOSIS — I5021 Acute systolic (congestive) heart failure: Secondary | ICD-10-CM

## 2016-04-06 DIAGNOSIS — J9601 Acute respiratory failure with hypoxia: Secondary | ICD-10-CM | POA: Diagnosis present

## 2016-04-06 DIAGNOSIS — R570 Cardiogenic shock: Secondary | ICD-10-CM | POA: Diagnosis present

## 2016-04-06 DIAGNOSIS — E1159 Type 2 diabetes mellitus with other circulatory complications: Secondary | ICD-10-CM

## 2016-04-06 DIAGNOSIS — F329 Major depressive disorder, single episode, unspecified: Secondary | ICD-10-CM | POA: Diagnosis present

## 2016-04-06 DIAGNOSIS — I1310 Hypertensive heart and chronic kidney disease without heart failure, with stage 1 through stage 4 chronic kidney disease, or unspecified chronic kidney disease: Secondary | ICD-10-CM | POA: Diagnosis present

## 2016-04-06 DIAGNOSIS — I255 Ischemic cardiomyopathy: Secondary | ICD-10-CM | POA: Diagnosis present

## 2016-04-06 DIAGNOSIS — I472 Ventricular tachycardia: Secondary | ICD-10-CM | POA: Diagnosis not present

## 2016-04-06 DIAGNOSIS — E1151 Type 2 diabetes mellitus with diabetic peripheral angiopathy without gangrene: Secondary | ICD-10-CM | POA: Diagnosis present

## 2016-04-06 DIAGNOSIS — I2510 Atherosclerotic heart disease of native coronary artery without angina pectoris: Secondary | ICD-10-CM

## 2016-04-06 DIAGNOSIS — Z7902 Long term (current) use of antithrombotics/antiplatelets: Secondary | ICD-10-CM

## 2016-04-06 DIAGNOSIS — R680 Hypothermia, not associated with low environmental temperature: Secondary | ICD-10-CM | POA: Diagnosis present

## 2016-04-06 DIAGNOSIS — I248 Other forms of acute ischemic heart disease: Secondary | ICD-10-CM | POA: Diagnosis present

## 2016-04-06 DIAGNOSIS — R0600 Dyspnea, unspecified: Secondary | ICD-10-CM

## 2016-04-06 DIAGNOSIS — I5023 Acute on chronic systolic (congestive) heart failure: Secondary | ICD-10-CM | POA: Diagnosis not present

## 2016-04-06 DIAGNOSIS — E871 Hypo-osmolality and hyponatremia: Secondary | ICD-10-CM | POA: Diagnosis present

## 2016-04-06 DIAGNOSIS — J189 Pneumonia, unspecified organism: Secondary | ICD-10-CM | POA: Diagnosis present

## 2016-04-06 DIAGNOSIS — K219 Gastro-esophageal reflux disease without esophagitis: Secondary | ICD-10-CM | POA: Diagnosis present

## 2016-04-06 DIAGNOSIS — R079 Chest pain, unspecified: Secondary | ICD-10-CM

## 2016-04-06 DIAGNOSIS — K5900 Constipation, unspecified: Secondary | ICD-10-CM | POA: Diagnosis not present

## 2016-04-06 DIAGNOSIS — E119 Type 2 diabetes mellitus without complications: Secondary | ICD-10-CM

## 2016-04-06 DIAGNOSIS — E11649 Type 2 diabetes mellitus with hypoglycemia without coma: Secondary | ICD-10-CM | POA: Diagnosis present

## 2016-04-06 DIAGNOSIS — Z7982 Long term (current) use of aspirin: Secondary | ICD-10-CM

## 2016-04-06 DIAGNOSIS — I509 Heart failure, unspecified: Secondary | ICD-10-CM | POA: Diagnosis present

## 2016-04-06 DIAGNOSIS — E1122 Type 2 diabetes mellitus with diabetic chronic kidney disease: Secondary | ICD-10-CM | POA: Diagnosis present

## 2016-04-06 DIAGNOSIS — I480 Paroxysmal atrial fibrillation: Secondary | ICD-10-CM | POA: Diagnosis present

## 2016-04-06 DIAGNOSIS — R0603 Acute respiratory distress: Secondary | ICD-10-CM | POA: Diagnosis present

## 2016-04-06 DIAGNOSIS — Z95828 Presence of other vascular implants and grafts: Secondary | ICD-10-CM

## 2016-04-06 DIAGNOSIS — Z794 Long term (current) use of insulin: Secondary | ICD-10-CM

## 2016-04-06 LAB — MAGNESIUM: MAGNESIUM: 2.1 mg/dL (ref 1.7–2.4)

## 2016-04-06 LAB — GLUCOSE, CAPILLARY
Glucose-Capillary: 158 mg/dL — ABNORMAL HIGH (ref 65–99)
Glucose-Capillary: 176 mg/dL — ABNORMAL HIGH (ref 65–99)
Glucose-Capillary: 255 mg/dL — ABNORMAL HIGH (ref 65–99)

## 2016-04-06 LAB — BRAIN NATRIURETIC PEPTIDE: B Natriuretic Peptide: 1199 pg/mL — ABNORMAL HIGH (ref 0.0–100.0)

## 2016-04-06 LAB — COMPREHENSIVE METABOLIC PANEL
ALK PHOS: 177 U/L — AB (ref 38–126)
ALT: 31 U/L (ref 17–63)
AST: 53 U/L — AB (ref 15–41)
Albumin: 2.8 g/dL — ABNORMAL LOW (ref 3.5–5.0)
Anion gap: 11 (ref 5–15)
BUN: 50 mg/dL — AB (ref 6–20)
CHLORIDE: 91 mmol/L — AB (ref 101–111)
CO2: 26 mmol/L (ref 22–32)
CREATININE: 1.72 mg/dL — AB (ref 0.61–1.24)
Calcium: 8.7 mg/dL — ABNORMAL LOW (ref 8.9–10.3)
GFR calc Af Amer: 46 mL/min — ABNORMAL LOW (ref >60)
GFR, EST NON AFRICAN AMERICAN: 39 mL/min — AB (ref >60)
Glucose, Bld: 264 mg/dL — ABNORMAL HIGH (ref 65–99)
Potassium: 3.5 mmol/L (ref 3.5–5.1)
Sodium: 128 mmol/L — ABNORMAL LOW (ref 135–145)
Total Bilirubin: 5.9 mg/dL — ABNORMAL HIGH (ref 0.3–1.2)
Total Protein: 7.8 g/dL (ref 6.5–8.1)

## 2016-04-06 LAB — TSH: TSH: 5.449 u[IU]/mL — AB (ref 0.350–4.500)

## 2016-04-06 LAB — CBC
HEMATOCRIT: 39.3 % — AB (ref 40.0–52.0)
Hemoglobin: 13.2 g/dL (ref 13.0–18.0)
MCH: 34.6 pg — ABNORMAL HIGH (ref 26.0–34.0)
MCHC: 33.5 g/dL (ref 32.0–36.0)
MCV: 103.4 fL — ABNORMAL HIGH (ref 80.0–100.0)
Platelets: 248 10*3/uL (ref 150–440)
RBC: 3.8 MIL/uL — AB (ref 4.40–5.90)
RDW: 15.7 % — ABNORMAL HIGH (ref 11.5–14.5)
WBC: 6.4 10*3/uL (ref 3.8–10.6)

## 2016-04-06 LAB — TROPONIN I
TROPONIN I: 0.07 ng/mL — AB (ref <0.03)
TROPONIN I: 0.08 ng/mL — AB (ref <0.03)

## 2016-04-06 MED ORDER — VENLAFAXINE HCL ER 75 MG PO CP24
150.0000 mg | ORAL_CAPSULE | Freq: Every day | ORAL | Status: DC
  Administered 2016-04-07 – 2016-04-12 (×6): 150 mg via ORAL
  Filled 2016-04-06 (×7): qty 2

## 2016-04-06 MED ORDER — ISOSORBIDE MONONITRATE ER 60 MG PO TB24
60.0000 mg | ORAL_TABLET | Freq: Every day | ORAL | Status: DC
  Administered 2016-04-06: 60 mg via ORAL
  Filled 2016-04-06: qty 1

## 2016-04-06 MED ORDER — NITROGLYCERIN 0.4 MG SL SUBL
0.4000 mg | SUBLINGUAL_TABLET | SUBLINGUAL | Status: DC | PRN
  Administered 2016-04-06 – 2016-04-08 (×3): 0.4 mg via SUBLINGUAL
  Filled 2016-04-06 (×2): qty 1

## 2016-04-06 MED ORDER — INSULIN GLARGINE 100 UNIT/ML ~~LOC~~ SOLN
25.0000 [IU] | Freq: Every day | SUBCUTANEOUS | Status: DC
  Administered 2016-04-06 – 2016-04-07 (×2): 25 [IU] via SUBCUTANEOUS
  Filled 2016-04-06 (×3): qty 0.25

## 2016-04-06 MED ORDER — AZITHROMYCIN 250 MG PO TABS
500.0000 mg | ORAL_TABLET | Freq: Every day | ORAL | Status: AC
  Administered 2016-04-06: 500 mg via ORAL
  Filled 2016-04-06: qty 2

## 2016-04-06 MED ORDER — INSULIN ASPART 100 UNIT/ML ~~LOC~~ SOLN
0.0000 [IU] | Freq: Every day | SUBCUTANEOUS | Status: DC
  Administered 2016-04-11: 2 [IU] via SUBCUTANEOUS
  Filled 2016-04-06: qty 2

## 2016-04-06 MED ORDER — DEXTROSE 5 % IV SOLN
INTRAVENOUS | Status: AC
  Administered 2016-04-06: 1 g
  Filled 2016-04-06: qty 10

## 2016-04-06 MED ORDER — SODIUM CHLORIDE 0.9 % IV SOLN
500.0000 mL | Freq: Once | INTRAVENOUS | Status: AC
  Administered 2016-04-06: 500 mL via INTRAVENOUS

## 2016-04-06 MED ORDER — AZITHROMYCIN 250 MG PO TABS
ORAL_TABLET | ORAL | Status: AC
  Administered 2016-04-06: 500 mg
  Filled 2016-04-06: qty 2

## 2016-04-06 MED ORDER — DEXTROSE 5 % IV SOLN
1.0000 g | INTRAVENOUS | Status: DC
  Administered 2016-04-06: 1 g via INTRAVENOUS
  Filled 2016-04-06 (×2): qty 10

## 2016-04-06 MED ORDER — FLUTICASONE PROPIONATE 50 MCG/ACT NA SUSP
2.0000 | Freq: Every day | NASAL | Status: DC | PRN
  Filled 2016-04-06: qty 16

## 2016-04-06 MED ORDER — THERA-GESIC 0.5-15 % EX CREA: TOPICAL_CREAM | Freq: Two times a day (BID) | CUTANEOUS | Status: DC

## 2016-04-06 MED ORDER — TROLAMINE SALICYLATE 10 % EX CREA
TOPICAL_CREAM | Freq: Two times a day (BID) | CUTANEOUS | Status: DC
  Administered 2016-04-07 – 2016-04-11 (×3): via TOPICAL
  Filled 2016-04-06: qty 85

## 2016-04-06 MED ORDER — AZITHROMYCIN 250 MG PO TABS
250.0000 mg | ORAL_TABLET | Freq: Every day | ORAL | Status: DC
  Administered 2016-04-07 – 2016-04-08 (×2): 250 mg via ORAL
  Filled 2016-04-06 (×2): qty 1

## 2016-04-06 MED ORDER — MORPHINE SULFATE (PF) 4 MG/ML IV SOLN
4.0000 mg | Freq: Once | INTRAVENOUS | Status: AC
  Administered 2016-04-06: 4 mg via INTRAVENOUS
  Filled 2016-04-06: qty 1

## 2016-04-06 MED ORDER — SACUBITRIL-VALSARTAN 24-26 MG PO TABS
1.0000 | ORAL_TABLET | Freq: Two times a day (BID) | ORAL | Status: DC
  Administered 2016-04-06: 1 via ORAL
  Filled 2016-04-06: qty 1

## 2016-04-06 MED ORDER — LORATADINE 10 MG PO TABS: 10.0000 mg | ORAL_TABLET | Freq: Every day | ORAL | Status: DC

## 2016-04-06 MED ORDER — MORPHINE SULFATE (PF) 4 MG/ML IV SOLN: 4.0000 mg | INTRAVENOUS | Status: DC | PRN

## 2016-04-06 MED ORDER — VITAMIN D 1000 UNITS PO TABS
2000.0000 [IU] | ORAL_TABLET | Freq: Every day | ORAL | Status: DC
  Administered 2016-04-07 – 2016-04-12 (×6): 2000 [IU] via ORAL
  Filled 2016-04-06 (×6): qty 2

## 2016-04-06 MED ORDER — CARVEDILOL 12.5 MG PO TABS
12.5000 mg | ORAL_TABLET | Freq: Two times a day (BID) | ORAL | Status: DC
  Administered 2016-04-06: 12.5 mg via ORAL
  Filled 2016-04-06: qty 1

## 2016-04-06 MED ORDER — GLUCOSE 40 % PO GEL: 1.0000 | Freq: Once | ORAL | Status: DC | PRN

## 2016-04-06 MED ORDER — FUROSEMIDE 10 MG/ML IJ SOLN
40.0000 mg | Freq: Two times a day (BID) | INTRAMUSCULAR | Status: DC
  Administered 2016-04-06: 40 mg via INTRAVENOUS
  Filled 2016-04-06: qty 4

## 2016-04-06 MED ORDER — SPIRONOLACTONE 25 MG PO TABS: 25.0000 mg | ORAL_TABLET | Freq: Every day | ORAL | Status: DC

## 2016-04-06 MED ORDER — POTASSIUM CHLORIDE CRYS ER 20 MEQ PO TBCR
20.0000 meq | EXTENDED_RELEASE_TABLET | Freq: Two times a day (BID) | ORAL | Status: DC
  Administered 2016-04-06: 20 meq via ORAL
  Filled 2016-04-06: qty 1

## 2016-04-06 MED ORDER — ATORVASTATIN CALCIUM 20 MG PO TABS
80.0000 mg | ORAL_TABLET | Freq: Every day | ORAL | Status: DC
  Administered 2016-04-06 – 2016-04-11 (×6): 80 mg via ORAL
  Filled 2016-04-06 (×7): qty 4

## 2016-04-06 MED ORDER — FUROSEMIDE 10 MG/ML IJ SOLN: 60.0000 mg | Freq: Once | INTRAMUSCULAR | Status: DC

## 2016-04-06 MED ORDER — CLOPIDOGREL BISULFATE 75 MG PO TABS
75.0000 mg | ORAL_TABLET | Freq: Every day | ORAL | Status: DC
  Administered 2016-04-07 – 2016-04-12 (×6): 75 mg via ORAL
  Filled 2016-04-06 (×6): qty 1

## 2016-04-06 MED ORDER — INSULIN ASPART 100 UNIT/ML ~~LOC~~ SOLN
10.0000 [IU] | Freq: Three times a day (TID) | SUBCUTANEOUS | Status: DC
  Administered 2016-04-06 – 2016-04-09 (×7): 10 [IU] via SUBCUTANEOUS
  Filled 2016-04-06 (×7): qty 10

## 2016-04-06 MED ORDER — INSULIN ASPART 100 UNIT/ML ~~LOC~~ SOLN
0.0000 [IU] | Freq: Three times a day (TID) | SUBCUTANEOUS | Status: DC
  Administered 2016-04-07 (×2): 2 [IU] via SUBCUTANEOUS
  Administered 2016-04-08 (×2): 3 [IU] via SUBCUTANEOUS
  Administered 2016-04-09 – 2016-04-10 (×2): 2 [IU] via SUBCUTANEOUS
  Administered 2016-04-10: 3 [IU] via SUBCUTANEOUS
  Administered 2016-04-11 – 2016-04-12 (×3): 2 [IU] via SUBCUTANEOUS
  Administered 2016-04-12: 1 [IU] via SUBCUTANEOUS
  Filled 2016-04-06: qty 3
  Filled 2016-04-06 (×2): qty 2
  Filled 2016-04-06: qty 7
  Filled 2016-04-06: qty 3
  Filled 2016-04-06: qty 1
  Filled 2016-04-06: qty 2
  Filled 2016-04-06: qty 3
  Filled 2016-04-06: qty 2
  Filled 2016-04-06: qty 7
  Filled 2016-04-06: qty 2

## 2016-04-06 MED ORDER — ASPIRIN EC 81 MG PO TBEC
81.0000 mg | DELAYED_RELEASE_TABLET | Freq: Every day | ORAL | Status: DC
  Administered 2016-04-07 – 2016-04-12 (×6): 81 mg via ORAL
  Filled 2016-04-06 (×6): qty 1

## 2016-04-06 MED ORDER — NITROGLYCERIN 0.4 MG SL SUBL
SUBLINGUAL_TABLET | SUBLINGUAL | Status: AC
  Administered 2016-04-06: 0.4 mg via SUBLINGUAL
  Filled 2016-04-06: qty 1

## 2016-04-06 MED ORDER — TRAZODONE HCL 50 MG PO TABS
100.0000 mg | ORAL_TABLET | Freq: Every day | ORAL | Status: DC
  Administered 2016-04-06: 100 mg via ORAL
  Filled 2016-04-06: qty 1

## 2016-04-06 MED ORDER — FUROSEMIDE 10 MG/ML IJ SOLN
80.0000 mg | Freq: Once | INTRAMUSCULAR | Status: AC
  Administered 2016-04-06: 80 mg via INTRAVENOUS
  Filled 2016-04-06: qty 8

## 2016-04-06 NOTE — Progress Notes (Signed)
Pt is diabetic with insulin orders, no finger sticks or sliding scale coverage. Prime doc paged and placed orders.

## 2016-04-06 NOTE — H&P (Signed)
[facility] - [place] at [facility]   PATIENT NAME: Kyle Morton    MR#:  [id number]  DATE OF BIRTH:  Mar 16, 1951  DATE OF ADMISSION:  04/06/2016  PRIMARY CARE PHYSICIAN: Toya Smothers, PA   REQUESTING/REFERRING PHYSICIAN: Dr. Roxan Hockey  CHIEF COMPLAINT:   Shortness of breath and chest pressure HISTORY OF PRESENT ILLNESS:  Kyle Morton  is a 65 y.o. male with a known history of Essential hypertension, diabetes mellitus, chronic systolic congestive heart failure is presenting to the ED with a chief complaint of progressively worsening of shortness of breath associated with the chest pressure for 3-4 weeks. Reporting cough and 9 pounds weight gain over the past week. Chest x-ray with right lower lobe pneumonia and elevated BNP and abnormal troponin. ED physician has discussed with patient's cardiologist Dr. Mariah Milling who has recommended IV Lasix at this time regarding his congestive heart failure  PAST MEDICAL HISTORY:   Past Medical History:  Diagnosis Date  . Benign essential HTN   . Carotid artery stenosis    a. carotid dopplers 12/2014: less than 39% ICA bilaterally, f/u 1 year  . Chronic combined systolic and diastolic CHF (congestive heart failure) (HCC)    a. reported baseline EF 30-35%; b. echo 12/2014: EF 20-25%, diffuse HK, restrictive filling pattern, severely dilated LA, mod MR/TR/PR, PASP 70 mm Hg, IVC dilted c/w elevated CVP. c. EF 15% by cath in 01/2015.  Marland Kitchen CKD (chronic kidney disease), stage III   . Coronary artery disease    a. xience stent to D1 in 2007; 2.5 x 15 mm stent to OM3 in 2013, w/ 3 stents in 2015 (details not available);  b. 01/2015 Cath: LM nl, LAD 60p, 48m ISR, D1 95ost, 20 ISR, RI min irregs, LCX 20 ISR, OM1 small, OM2 30 ISR, OM3 40 ISR, RCA 100p, EF 15%, 2+ MR, elev R heart pres. c. 01/2015: s/p complex LAD diagonal bifurcation angioplasty/stenting with balloon angioplasty of the D1 & DES to prox LAD.  Marland Kitchen Depression   . DM2 (diabetes  mellitus, type 2) (HCC)   . GERD (gastroesophageal reflux disease)   . Gout    feet   . Hyperlipidemia   . Ischemic cardiomyopathy   . Pleural effusion   . PVC's (premature ventricular contractions)   . QT prolongation   . Vitamin D deficiency     PAST SURGICAL HISTOIRY:   Past Surgical History:  Procedure Laterality Date  . CARDIAC CATHETERIZATION  2013,2007  . CARDIAC CATHETERIZATION N/A 01/07/2015   Procedure: Right and Left Heart Cath;  Surgeon: Iran Ouch, MD;  Location: [facility] INVASIVE CV LAB;  Service: Cardiovascular;  Laterality: N/A;  . CARDIAC CATHETERIZATION N/A 02/19/2015   Procedure: Coronary Stent Intervention;  Surgeon: Iran Ouch, MD;  Location: [facility] INVASIVE CV LAB;  Service: Cardiovascular;  Laterality: N/A;  . CORONARY ANGIOPLASTY  02/19/2015  . CORONARY ANGIOPLASTY WITH STENT PLACEMENT  06/05/2012   OM3  . CORONARY ANGIOPLASTY WITH STENT PLACEMENT  04/25/2006   RAD  . CORONARY STENT PLACEMENT  02/19/2015   lad     SOCIAL HISTORY:   Social History  Substance Use Topics  . Smoking status: Never Smoker  . Smokeless tobacco: Never Used  . Alcohol use No    FAMILY HISTORY:   Family History  Problem Relation Age of Onset  . Hypertension Mother   . Hypertension Father   . Hypertension Brother     DRUG ALLERGIES:  No Known Allergies  REVIEW OF SYSTEMS:  CONSTITUTIONAL: No fever, fatigue or weakness. Gain 9 pounds weight in the past 2-3 weeks EYES: No blurred or double vision.  EARS, NOSE, AND THROAT: No tinnitus or ear pain.  RESPIRATORY: Reporting cough, shortness of breath, denies wheezing or hemoptysis.  CARDIOVASCULAR: Reporting chest pressure, denies orthopnea, edema.  GASTROINTESTINAL: No nausea, vomiting, diarrhea or abdominal pain.  GENITOURINARY: No dysuria, hematuria.  ENDOCRINE: No polyuria, nocturia,  HEMATOLOGY: No anemia, easy bruising or bleeding SKIN: No rash or lesion. MUSCULOSKELETAL: No joint pain or arthritis.    NEUROLOGIC: No tingling, numbness, weakness.  PSYCHIATRY: No anxiety or depression.   MEDICATIONS AT HOME:   Prior to Admission medications   Medication Sig Start Date End Date Taking? Authorizing Provider  allopurinol (ZYLOPRIM) 100 MG tablet Take 200 mg by mouth daily.    Yes Historical Provider, MD  aspirin EC 81 MG tablet Take 81 mg by mouth daily.   Yes Historical Provider, MD  atorvastatin (LIPITOR) 80 MG tablet Take 80 mg by mouth daily.   Yes Historical Provider, MD  carvedilol (COREG) 12.5 MG tablet Take 12.5 mg by mouth 2 (two) times daily with a meal.   Yes Historical Provider, MD  cetirizine (ZYRTEC) 10 MG tablet Take 10 mg by mouth daily.   Yes Historical Provider, MD  clopidogrel (PLAVIX) 75 MG tablet Take 1 tablet (75 mg total) by mouth daily. 02/20/15  Yes Dayna N Dunn, PA-C  colchicine 0.6 MG tablet Take 0.6 mg by mouth as needed.    Yes Historical Provider, MD  dextrose (GLUTOSE) 40 % GEL Take 1 Tube by mouth once as needed for low blood sugar.   Yes Historical Provider, MD  fluticasone (FLONASE) 50 MCG/ACT nasal spray Place 2 sprays into both nostrils daily as needed for allergies or rhinitis.    Yes Historical Provider, MD  Hexylresorcinol (THROAT LOZENGES MT) Use as directed 1 Dose in the mouth or throat every 4 (four) hours as needed (for sore throat).   Yes Historical Provider, MD  insulin aspart (NOVOLOG) 100 UNIT/ML injection Inject 10 Units into the skin 3 (three) times daily before meals.   Yes Historical Provider, MD  insulin glargine (LANTUS) 100 UNIT/ML injection Inject 25 Units into the skin at bedtime.   Yes Historical Provider, MD  isosorbide mononitrate (IMDUR) 60 MG 24 hr tablet Take 1 tablet (60 mg total) by mouth daily. 05/30/15  Yes Antonieta Iba, MD  potassium chloride SA (K-DUR,KLOR-CON) 20 MEQ tablet Take 1 tablet (20 mEq total) by mouth 2 (two) times daily. 01/10/15  Yes Ok Anis, NP  spironolactone (ALDACTONE) 25 MG tablet Take 25 mg by  mouth daily.    Yes Historical Provider, MD  torsemide (DEMADEX) 20 MG tablet Take 1 tablet (20 mg total) by mouth daily. Take 10mg  daily by mouth for now. Hold dose if weight is less than 146 pounds. 05/30/15  Yes Antonieta Iba, MD  traZODone (DESYREL) 100 MG tablet Take 100 mg by mouth at bedtime.   Yes Historical Provider, MD  venlafaxine XR (EFFEXOR-XR) 150 MG 24 hr capsule Take 150 mg by mouth daily with breakfast.   Yes Historical Provider, MD  Vitamin D, Cholecalciferol, 1000 UNITS CAPS Take 2,000 Units by mouth daily.   Yes Historical Provider, MD  Menthol-Methyl Salicylate (THERA-GESIC EX) Apply 1 Dose topically 2 (two) times daily.     Historical Provider, MD  nitroGLYCERIN (NITROSTAT) 0.4 MG SL tablet Place 1 tablet (0.4 mg total) under the tongue every 5 (five) minutes  as needed for chest pain. 01/10/15   Ok Anishristopher R Berge, NP  sacubitril-valsartan (ENTRESTO) 24-26 MG Take 1 tablet by mouth 2 (two) times daily.    Historical Provider, MD  sodium fluoride (LURIDE) 1.1 (0.5 F) MG/ML SOLN Take 1 drop by mouth daily as needed.    Historical Provider, MD      VITAL SIGNS:  Blood pressure (!) 125/91, pulse (!) 50, temperature 97.4 F (36.3 C), temperature source Axillary, resp. rate (!) 22, height 5\' 6"  (1.676 m), weight 71.7 kg (158 lb), SpO2 100 %.  PHYSICAL EXAMINATION:  GENERAL:  65 y.o.-year-old patient lying in the bed with no acute distress.  EYES: Pupils equal, round, reactive to light and accommodation. No scleral icterus. Extraocular muscles intact.  HEENT: Head atraumatic, normocephalic. Oropharynx and nasopharynx clear.  NECK:  Supple, no jugular venous distention. No thyroid enlargement, no tenderness.  LUNGS: Moderate breath sounds bilaterally, no wheezing, positive rales,rhonchi or crepitation. No use of accessory muscles of respiration.  CARDIOVASCULAR: S1, S2 normal. No murmurs, rubs, or gallops. No reproducible anterior chest wall tenderness on palpation ABDOMEN:  Soft, nontender, nondistended. Bowel sounds present. No organomegaly or mass.  EXTREMITIES: 1+ pitting pedal edema, no cyanosis, or clubbing.  NEUROLOGIC: Cranial nerves II through XII are intact. Muscle strength 5/5 in all extremities. Sensation intact. Gait not checked.  PSYCHIATRIC: The patient is alert and oriented x 3.  SKIN: No obvious rash, lesion, or ulcer.   LABORATORY PANEL:   CBC  Recent Labs Lab 04/06/16 1205  WBC 6.4  HGB 13.2  HCT 39.3*  PLT 248   ------------------------------------------------------------------------------------------------------------------  Chemistries   Recent Labs Lab 04/06/16 1205  NA 128*  K 3.5  CL 91*  CO2 26  GLUCOSE 264*  BUN 50*  CREATININE 1.72*  CALCIUM 8.7*  AST 53*  ALT 31  ALKPHOS 177*  BILITOT 5.9*   ------------------------------------------------------------------------------------------------------------------  Cardiac Enzymes  Recent Labs Lab 04/06/16 1205  TROPONINI 0.07*   ------------------------------------------------------------------------------------------------------------------  RADIOLOGY:  Dg Chest 2 View  Result Date: 04/06/2016 CLINICAL DATA:  Shortness of Breath EXAM: CHEST  2 VIEW COMPARISON:  January 28, 2015 FINDINGS: There is airspace consolidation in the right lower lobe. Lungs elsewhere are clear. There is mild cardiomegaly with pulmonary vascularity within normal limits. There is atherosclerotic calcification in the aorta. No adenopathy. No bone lesions. IMPRESSION: Airspace consolidation in the right lower lobe consistent with pneumonia. Lungs elsewhere clear. Mild cardiomegaly present. There is aortic atherosclerosis. Followup PA and lateral chest radiographs recommended in 3-4 weeks following trial of antibiotic therapy to ensure resolution and exclude underlying malignancy. Electronically Signed   By: Bretta BangWilliam  Woodruff III M.D.   On: 04/06/2016 12:28    EKG:   Orders placed or performed  during the hospital encounter of 04/06/16  . ED EKG within 10 minutes  . ED EKG within 10 minutes    IMPRESSION AND PLAN:   Kyle Morton  is a 65 y.o. male with a known history of Essential hypertension, diabetes mellitus, chronic systolic congestive heart failure is presenting to the ED with a chief complaint of progressively worsening of shortness of breath associated with the chest pressure for 3-4 weeks. Reporting cough and 9 pounds weight gain over the past week. Chest x-ray with right lower lobe pneumonia and elevated BNP and abnormal troponin.  # Shortness of breath secondary to CHF exacerbation and pneumonia Admit to telemetry Provide IV Lasix and antibiotics  #CHF exacerbation systolic-recent ejection fraction 20-25% Admit to telemetry Provide IV Lasix  and continue spironolactone Monitor intake and output Daily weights Continue home medications Imdur, Aldactone, Plavix, aspirin, statin, Coreg, and entresto Cardiology consult is placed to Dr. Mariah Milling Cycle cardiac biomarkers in view of elevated troponin which could be from demand ischemia Cardiology might consider pacemaker placement given the low ejection fraction Echocardiogram is ordered  #Pneumonia community-acquired IV Rocephin and azithromycin Sputum culture and sensitivity is sputum is obtainable  #Insulin requiring diabetic mellitus Diabetic diet, sliding scale insulin and resume home dose Lantus 25 units subcutaneous daily at bedtime  #Acute kidney injury  Holding home medications allopurinol and colchicine Monitor renal function closely  #Hyponatremia could be from fluid overload On Lasix IV and monitor serial sodiums   All the records are reviewed and case discussed with ED provider. Management plans discussed with the patient, family and they are in agreement.  CODE STATUS: fc/Daughter is the healthcare power of attorney  TOTAL TIME TAKING CARE OF THIS PATIENT: 45 minutes.   Note: This dictation was  prepared with Dragon dictation along with smaller phrase technology. Any transcriptional errors that result from this process are unintentional.  Ramonita Lab M.D on 04/06/2016 at 3:44 PM  Between 7am to 6pm - Pager - [phone]  After 6pm go to [email or web address] - password [email or web address] [facility]  [facility]  Office  [phone]  CC: Primary care physician; Toya Smothers, PA

## 2016-04-06 NOTE — ED Notes (Signed)
Resumed care from stephen rn.  Pt alert.  Pt is waiting on bed assignment.  Pt denies pain.  Family at bedside.

## 2016-04-06 NOTE — Plan of Care (Signed)
Problem: Phase I Progression Outcomes Goal: Anginal pain relieved Outcome: Not Progressing MDs aware, pain medication IV and SL given per orders. Will continue to assess.

## 2016-04-06 NOTE — Progress Notes (Signed)
Dr Gollan at bedside °

## 2016-04-06 NOTE — ED Provider Notes (Signed)
[facility] Emergency Department Provider Note    None    (approximate)  I have reviewed the triage vital signs and the nursing notes.   HISTORY  Chief Complaint Shortness of Breath and Chest Pain    HPI Kyle Morton is a 65 y.o. male presents to the ER with 1 week of worsening shortness of breath and gradual chest pressure. Patient does have known congestive heart failure as well as CK D and CAD. States that he has been on diuretics but has had 9 pound weight gain over the past week and is having worsening orthopnea. Denies any active chest pain at this time. Came to the ER today due to worsening shortness of breath.   Past Medical History:  Diagnosis Date  . Benign essential HTN   . Carotid artery stenosis    a. carotid dopplers 12/2014: less than 39% ICA bilaterally, f/u 1 year  . Chronic combined systolic and diastolic CHF (congestive heart failure) (HCC)    a. reported baseline EF 30-35%; b. echo 12/2014: EF 20-25%, diffuse HK, restrictive filling pattern, severely dilated LA, mod MR/TR/PR, PASP 70 mm Hg, IVC dilted c/w elevated CVP. c. EF 15% by cath in 01/2015.  Marland Kitchen CKD (chronic kidney disease), stage III   . Coronary artery disease    a. xience stent to D1 in 2007; 2.5 x 15 mm stent to OM3 in 2013, w/ 3 stents in 2015 (details not available);  b. 01/2015 Cath: LM nl, LAD 60p, 49m ISR, D1 95ost, 20 ISR, RI min irregs, LCX 20 ISR, OM1 small, OM2 30 ISR, OM3 40 ISR, RCA 100p, EF 15%, 2+ MR, elev R heart pres. c. 01/2015: s/p complex LAD diagonal bifurcation angioplasty/stenting with balloon angioplasty of the D1 & DES to prox LAD.  Marland Kitchen Depression   . DM2 (diabetes mellitus, type 2) (HCC)   . GERD (gastroesophageal reflux disease)   . Gout    feet   . Hyperlipidemia   . Ischemic cardiomyopathy   . Pleural effusion   . PVC's (premature ventricular contractions)   . QT prolongation   . Vitamin D deficiency     Patient Active Problem List   Diagnosis  Date Noted  . CAD (coronary artery disease) 02/19/2015  . Lightheadedness   . Coronary artery disease involving native coronary artery of native heart with angina pectoris (HCC)   . ARF (acute renal failure) (HCC) 01/28/2015  . Acute on chronic systolic CHF (congestive heart failure), NYHA class 4 (HCC) 01/07/2015  . Chronic combined systolic and diastolic CHF (congestive heart failure) (HCC)   . Essential hypertension   . Hyperlipidemia   . DM2 (diabetes mellitus, type 2) (HCC)   . CAD S/P percutaneous coronary angioplasty   . GERD (gastroesophageal reflux disease)   . SOB (shortness of breath) 09/04/2014  . History of coronary artery stent placement 09/04/2014  . Cardiomyopathy, ischemic 09/04/2014  . Bronchitis 09/04/2014    Past Surgical History:  Procedure Laterality Date  . CARDIAC CATHETERIZATION  2013,2007  . CARDIAC CATHETERIZATION N/A 01/07/2015   Procedure: Right and Left Heart Cath;  Surgeon: Iran Ouch, MD;  Location: [facility] INVASIVE CV LAB;  Service: Cardiovascular;  Laterality: N/A;  . CARDIAC CATHETERIZATION N/A 02/19/2015   Procedure: Coronary Stent Intervention;  Surgeon: Iran Ouch, MD;  Location: [facility] INVASIVE CV LAB;  Service: Cardiovascular;  Laterality: N/A;  . CORONARY ANGIOPLASTY  02/19/2015  . CORONARY ANGIOPLASTY WITH STENT PLACEMENT  06/05/2012   OM3  .  CORONARY ANGIOPLASTY WITH STENT PLACEMENT  04/25/2006   RAD  . CORONARY STENT PLACEMENT  02/19/2015   lad     Prior to Admission medications   Medication Sig Start Date End Date Taking? Authorizing Provider  allopurinol (ZYLOPRIM) 100 MG tablet Take 200 mg by mouth daily.     Historical Provider, MD  aspirin EC 81 MG tablet Take 81 mg by mouth daily.    Historical Provider, MD  atorvastatin (LIPITOR) 80 MG tablet Take 80 mg by mouth daily.    Historical Provider, MD  carvedilol (COREG) 12.5 MG tablet Take 12.5 mg by mouth 2 (two) times daily with a meal.    Historical Provider, MD  cetirizine  (ZYRTEC) 10 MG tablet Take 10 mg by mouth daily.    Historical Provider, MD  clopidogrel (PLAVIX) 75 MG tablet Take 1 tablet (75 mg total) by mouth daily. 02/20/15   Dayna N Dunn, PA-C  colchicine 0.6 MG tablet Take 0.6 mg by mouth as needed.     Historical Provider, MD  dextrose (GLUTOSE) 40 % GEL Take 1 Tube by mouth once as needed for low blood sugar.    Historical Provider, MD  fluticasone (FLONASE) 50 MCG/ACT nasal spray Place 2 sprays into both nostrils daily as needed for allergies or rhinitis.     Historical Provider, MD  Hexylresorcinol (THROAT LOZENGES MT) Use as directed 1 Dose in the mouth or throat every 4 (four) hours as needed (for sore throat).    Historical Provider, MD  insulin aspart (NOVOLOG) 100 UNIT/ML injection Inject 10 Units into the skin 3 (three) times daily before meals.    Historical Provider, MD  insulin glargine (LANTUS) 100 UNIT/ML injection Inject 25 Units into the skin at bedtime.    Historical Provider, MD  isosorbide mononitrate (IMDUR) 60 MG 24 hr tablet Take 1 tablet (60 mg total) by mouth daily. 05/30/15   Antonieta Iba, MD  Menthol-Methyl Salicylate (THERA-GESIC EX) Apply 1 Dose topically 2 (two) times daily.     Historical Provider, MD  nitroGLYCERIN (NITROSTAT) 0.4 MG SL tablet Place 1 tablet (0.4 mg total) under the tongue every 5 (five) minutes as needed for chest pain. 01/10/15   Ok Anis, NP  potassium chloride SA (K-DUR,KLOR-CON) 20 MEQ tablet Take 1 tablet (20 mEq total) by mouth 2 (two) times daily. 01/10/15   Ok Anis, NP  sacubitril-valsartan (ENTRESTO) 24-26 MG Take 1 tablet by mouth 2 (two) times daily.    Historical Provider, MD  sodium fluoride (LURIDE) 1.1 (0.5 F) MG/ML SOLN Take 1 drop by mouth daily as needed.    Historical Provider, MD  spironolactone (ALDACTONE) 25 MG tablet Take 25 mg by mouth daily.     Historical Provider, MD  torsemide (DEMADEX) 20 MG tablet Take 1 tablet (20 mg total) by mouth daily. Take 10mg  daily  by mouth for now. Hold dose if weight is less than 146 pounds. 05/30/15   Antonieta Iba, MD  traZODone (DESYREL) 100 MG tablet Take 100 mg by mouth at bedtime.    Historical Provider, MD  venlafaxine XR (EFFEXOR-XR) 150 MG 24 hr capsule Take 150 mg by mouth daily with breakfast.    Historical Provider, MD  Vitamin D, Cholecalciferol, 1000 UNITS CAPS Take 2,000 Units by mouth daily.    Historical Provider, MD    Allergies Review of patient's allergies indicates no known allergies.  Family History  Problem Relation Age of Onset  . Hypertension Mother   . Hypertension  Father   . Hypertension Brother     Social History Social History  Substance Use Topics  . Smoking status: Never Smoker  . Smokeless tobacco: Never Used  . Alcohol use No    Review of Systems Patient denies headaches, rhinorrhea, blurry vision, numbness, shortness of breath, chest pain, edema, cough, abdominal pain, nausea, vomiting, diarrhea, dysuria, fevers, rashes or hallucinations unless otherwise stated above in HPI. ____________________________________________   PHYSICAL EXAM:  VITAL SIGNS: Vitals:   04/06/16 1148  BP: (!) 125/91  Pulse: (!) 50  Resp: (!) 22  Temp: 97.4 F (36.3 C)    Constitutional: Alert and oriented. Well appearing and in no acute distress. Eyes: Conjunctivae are normal. PERRL. EOMI. Head: Atraumatic. Nose: No congestion/rhinnorhea. Mouth/Throat: Mucous membranes are moist.  Oropharynx non-erythematous. Neck: No stridor. Painless ROM. No cervical spine tenderness to palpation Hematological/Lymphatic/Immunilogical: No cervical lymphadenopathy. Cardiovascular: Normal rate, regular rhythm. Grossly normal heart sounds.  Good peripheral circulation. Respiratory: Normal respiratory effort.  No retractions. Respiratory crackles in posterior lung fields. Gastrointestinal: Soft and nontender. No distention. No abdominal bruits. No CVA tenderness.  Musculoskeletal: No lower extremity  tenderness to plus bilateral pitting edema  No joint effusions. Neurologic:  Normal speech and language. No gross focal neurologic deficits are appreciated. No gait instability. Skin:  Skin is warm, dry and intact. No rash noted. Psychiatric: Mood and affect are normal. Speech and behavior are normal.  ____________________________________________   LABS (all labs ordered are listed, but only abnormal results are displayed)  Results for orders placed or performed during the hospital encounter of 04/06/16 (from the past 24 hour(s))  CBC     Status: Abnormal   Collection Time: 04/06/16 12:05 PM  Result Value Ref Range   WBC 6.4 3.8 - 10.6 K/uL   RBC 3.80 (L) 4.40 - 5.90 MIL/uL   Hemoglobin 13.2 13.0 - 18.0 g/dL   HCT 78.239.3 (L) 95.640.0 - 21.352.0 %   MCV 103.4 (H) 80.0 - 100.0 fL   MCH 34.6 (H) 26.0 - 34.0 pg   MCHC 33.5 32.0 - 36.0 g/dL   RDW 08.615.7 (H) 57.811.5 - 46.914.5 %   Platelets 248 150 - 440 K/uL  Troponin I     Status: Abnormal   Collection Time: 04/06/16 12:05 PM  Result Value Ref Range   Troponin I 0.07 (HH) <0.03 ng/mL  Comprehensive metabolic panel     Status: Abnormal   Collection Time: 04/06/16 12:05 PM  Result Value Ref Range   Sodium 128 (L) 135 - 145 mmol/L   Potassium 3.5 3.5 - 5.1 mmol/L   Chloride 91 (L) 101 - 111 mmol/L   CO2 26 22 - 32 mmol/L   Glucose, Bld 264 (H) 65 - 99 mg/dL   BUN 50 (H) 6 - 20 mg/dL   Creatinine, Ser 6.291.72 (H) 0.61 - 1.24 mg/dL   Calcium 8.7 (L) 8.9 - 10.3 mg/dL   Total Protein 7.8 6.5 - 8.1 g/dL   Albumin 2.8 (L) 3.5 - 5.0 g/dL   AST 53 (H) 15 - 41 U/L   ALT 31 17 - 63 U/L   Alkaline Phosphatase 177 (H) 38 - 126 U/L   Total Bilirubin 5.9 (H) 0.3 - 1.2 mg/dL   GFR calc non Af Amer 39 (L) >60 mL/min   GFR calc Af Amer 46 (L) >60 mL/min   Anion gap 11 5 - 15  Brain natriuretic peptide     Status: Abnormal   Collection Time: 04/06/16 12:06 PM  Result Value  Ref Range   B Natriuretic Peptide 1,199.0 (H) 0.0 - 100.0 pg/mL    ____________________________________________  EKG My review and personal interpretation at Time: 12:00 indication chest pain, rate 85, prolonged QT, poor R-wave progression, no acute ST elevations.  ____________________________________________  RADIOLOGY  Chest x-ray with increased interstitial markings, no effusions. ____________________________________________   PROCEDURES  Procedure(s) performed: none    Critical Care performed: no ____________________________________________   INITIAL IMPRESSION / ASSESSMENT AND PLAN / ED COURSE  Pertinent labs & imaging results that were available during my care of the patient were reviewed by me and considered in my medical decision making (see chart for details).  DDX: Asthma, copd, CHF, pna, ptx, malignancy, Pe, anemia   Reco Kyle Morton is a 65 y.o. who presents to the ED with history of congestive heart failure presents with 1 week of weak gain as well as worsening shortness of breath and chest pressure. He arrives afebrile and hemodynamic stable. His initial EKG is without evidence of acute ischemic changes. His exam and history is more consistent with volume overload and acute on chronic congestive heart failure. Laboratory evaluation does show elevated troponin 0.07. Patient is currently chest pain-free therefore I have a lower suspicion for acute coronary syndrome the patient is at high risk. I spoke with Dr. Lewie Loron regarding the patient's presentation.  Patient with significant CHF failing outpatient management with diuretics. Patient will require admission for further IV diuretics as well as cardiology evaluation for possible pacemaker placement. Have discussed with the patient and available family all diagnostics and treatments performed thus far and all questions were answered to the best of my ability. The patient demonstrates understanding and agreement with plan.   Clinical Course      ____________________________________________   FINAL CLINICAL IMPRESSION(S) / ED DIAGNOSES  Final diagnoses:  Shortness of breath  Acute on chronic systolic congestive heart failure (HCC)      NEW MEDICATIONS STARTED DURING THIS VISIT:  New Prescriptions   No medications on file     Note:  This document was prepared using Dragon voice recognition software and may include unintentional dictation errors.    Willy Eddy, MD 04/06/16 [phone]

## 2016-04-06 NOTE — Consult Note (Signed)
Cardiology Consultation Note  Patient ID: Kyle Morton, MRN: [id number], DOB/AGE: [birth year] 67 y.o. Admit date: 04/06/2016   Date of Consult: 04/06/2016 Primary Physician: Desiree Hane, PA Primary Cardiologist: Dr. Rockey Situ, MD Requesting Physician: Dr. Quentin Cornwall, MD  Chief Complaint: SOB/weight gain Reason for Consult: Acute on chronic combined CHF  HPI: 65 y.o. male with h/o CAD s/p multiple proor PCI's of the LAD, D1, LCx, OM2 and OM3, ICM, chornic combined systolic and diastolic CHF with EF as low as 15% by cath in 01/2015 and 20-25% by echo, chronic SOB, hypertensive heart disease, HLD, DM2, carotid artery stenosis, prolonged QT, and CKD stage III (baseline SCR 1.4-1.5) who presented to [facility] on 9/5 with increased SOB and weight gain. He was found to be volume overloaded and have a RLL PNA. Cardiology is consulted for acute on chronic CHF.   He was last seen by Dr. Rockey Situ in 05/2015 in follow up from prior admission for SOB in which he underwent diagnostic R/LHC in 01/2015 that showed patent LAD, D1, LCx, OM2, and OM3 stents with a chronic total occlusion of the RCA. The D1 had a new 95% ostial stenosis. It was felt that this would require a complex PCI at the bifurcation of the LAD and D1. Decision was made to defer PCI to a later date as he was found to be volume overloaded with low output heart failure (CO 3.14 L/min; CI 1.75 L/min/m^2) and a LVEDP of 27 mmHg. As a result, he was admitted for aggressive IV diuresis, diuresing more than 8 L. He subsequently underwent complex LAD diagonal bifurcation angioplasty with stenting with balloon angioplasty of D1 and DES placement to the proximal LAD. There was still significant residual stenosis at ostial D1 which could not be crossed with a balloon after stent placement in the LAD. However, balloon angioplasty was performed before stent placement in the LAD. It was advised the patient have recheck of LV systolic function in 6-8 weeks s/p PCI. Recheck echo in  07/2015 showed EF remained low at 20-25% (unchanged from prior) with improved right-heart pressures. Goal weight at time of his last follow up in 05/2015 was felt to be 148 pounds.   He presented to [facility] on 9/5 with a weight gain of 9 pounds in the past week coupled with chest pressure and increased SOB. Weight increased from 153-->159 over the past 2-3 days. He does not et salt and watches his PO fluid intake. He has not had much of an appetite lately given early satiety and food just had not tasted good. He notes an increase in his orthopnea to 3-pillows over the past 2 weeks. Significant LE swelling over the past couple of weeks as well. In the ED he was found to have a BNP of 1,199. CXR showed RLL PNA. SCr 1.72, K+ 3.5, albumin 2.8, ST 53, alk phos 177, Na 128, troponin 0.07. EKG showed NSR, 88 bpm, PACs and PVCs, left axis deviation, prolonged QTc at 539 msec, prior inferior and anterior infarct, nonspecific inferolateral st/t changes. He maintained 100% xygen saturations on room air in the ED. He was given IV Lasix 80 mg x 1 in the ED and admitted. Currently, he has a dry cough and is breathing on room air. Some chest pressure.     Past Medical History:  Diagnosis Date  . Benign essential HTN   . Carotid artery stenosis    a. carotid dopplers 12/2014: less than 39% ICA bilaterally, f/u 1 year  . Chronic combined systolic and  diastolic CHF (congestive heart failure) ([place])    a. reported baseline EF 30-35%; b. echo 12/2014: EF 20-25%, diffuse HK, restrictive filling pattern, severely dilated LA, mod MR/TR/PR, PASP 70 mm Hg, IVC dilted c/w elevated CVP. c. EF 15% by cath in 01/2015.  Marland Kitchen CKD (chronic kidney disease), stage III   . Coronary artery disease    a. xience stent to D1 in 2007; 2.5 x 15 mm stent to OM3 in 2013, w/ 3 stents in 2015 (details not available);  b. 01/2015 Cath: LM nl, LAD 60p, 64mISR, D1 95ost, 20 ISR, RI min irregs, LCX 20 ISR, OM1 small, OM2 30 ISR, OM3 40 ISR, RCA 100p, EF 15%,  2+ MR, elev R heart pres. c. 01/2015: s/p complex LAD diagonal bifurcation angioplasty/stenting with balloon angioplasty of the D1 & DES to prox LAD.  .Marland KitchenDepression   . DM2 (diabetes mellitus, type 2) ([place]   . GERD (gastroesophageal reflux disease)   . Gout    feet   . Hyperlipidemia   . Ischemic cardiomyopathy   . Pleural effusion   . PVC's (premature ventricular contractions)   . QT prolongation   . Vitamin D deficiency       Most Recent Cardiac Studies: As above   Surgical History:  Past Surgical History:  Procedure Laterality Date  . CARDIAC CATHETERIZATION  2013,2007  . CARDIAC CATHETERIZATION N/A 01/07/2015   Procedure: Right and Left Heart Cath;  Surgeon: MWellington Hampshire MD;  [facility];  Service: Cardiovascular;  Laterality: N/A;  . CARDIAC CATHETERIZATION N/A 02/19/2015   Procedure: Coronary Stent Intervention;  Surgeon: MWellington Hampshire MD;  [facility];  Service: Cardiovascular;  Laterality: N/A;  . CORONARY ANGIOPLASTY  02/19/2015  . CORONARY ANGIOPLASTY WITH STENT PLACEMENT  06/05/2012   OM3  . CORONARY ANGIOPLASTY WITH STENT PLACEMENT  04/25/2006   RAD  . CORONARY STENT PLACEMENT  02/19/2015   lad      Home Meds: Prior to Admission medications   Medication Sig Start Date End Date Taking? Authorizing Provider  allopurinol (ZYLOPRIM) 100 MG tablet Take 200 mg by mouth daily.    Yes Historical Provider, MD  aspirin EC 81 MG tablet Take 81 mg by mouth daily.   Yes Historical Provider, MD  atorvastatin (LIPITOR) 80 MG tablet Take 80 mg by mouth daily.   Yes Historical Provider, MD  carvedilol (COREG) 12.5 MG tablet Take 12.5 mg by mouth 2 (two) times daily with a meal.   Yes Historical Provider, MD  cetirizine (ZYRTEC) 10 MG tablet Take 10 mg by mouth daily.   Yes Historical Provider, MD  clopidogrel (PLAVIX) 75 MG tablet Take 1 tablet (75 mg total) by mouth daily. 02/20/15  Yes Dayna N Aubriel Khanna, PA-C  colchicine 0.6 MG tablet Take 0.6  mg by mouth as needed.    Yes Historical Provider, MD  dextrose (GLUTOSE) 40 % GEL Take 1 Tube by mouth once as needed for low blood sugar.   Yes Historical Provider, MD  fluticasone (FLONASE) 50 MCG/ACT nasal spray Place 2 sprays into both nostrils daily as needed for allergies or rhinitis.    Yes Historical Provider, MD  Hexylresorcinol (THROAT LOZENGES MT) Use as directed 1 Dose in the mouth or throat every 4 (four) hours as needed (for sore throat).   Yes Historical Provider, MD  insulin aspart (NOVOLOG) 100 UNIT/ML injection Inject 10 Units into the skin 3 (three) times daily before meals.   Yes Historical  Provider, MD  insulin glargine (LANTUS) 100 UNIT/ML injection Inject 25 Units into the skin at bedtime.   Yes Historical Provider, MD  isosorbide mononitrate (IMDUR) 60 MG 24 hr tablet Take 1 tablet (60 mg total) by mouth daily. 05/30/15  Yes Minna Merritts, MD  potassium chloride SA (K-DUR,KLOR-CON) 20 MEQ tablet Take 1 tablet (20 mEq total) by mouth 2 (two) times daily. 01/10/15  Yes Rogelia Mire, NP  spironolactone (ALDACTONE) 25 MG tablet Take 25 mg by mouth daily.    Yes Historical Provider, MD  torsemide (DEMADEX) 20 MG tablet Take 1 tablet (20 mg total) by mouth daily. Take 99m daily by mouth for now. Hold dose if weight is less than 146 pounds. 05/30/15  Yes TMinna Merritts MD  traZODone (DESYREL) 100 MG tablet Take 100 mg by mouth at bedtime.   Yes Historical Provider, MD  venlafaxine XR (EFFEXOR-XR) 150 MG 24 hr capsule Take 150 mg by mouth daily with breakfast.   Yes Historical Provider, MD  Vitamin D, Cholecalciferol, 1000 UNITS CAPS Take 2,000 Units by mouth daily.   Yes Historical Provider, MD  Menthol-Methyl Salicylate (THERA-GESIC EX) Apply 1 Dose topically 2 (two) times daily.     Historical Provider, MD  nitroGLYCERIN (NITROSTAT) 0.4 MG SL tablet Place 1 tablet (0.4 mg total) under the tongue every 5 (five) minutes as needed for chest pain. 01/10/15   CRogelia Mire NP  sacubitril-valsartan (ENTRESTO) 24-26 MG Take 1 tablet by mouth 2 (two) times daily.    Historical Provider, MD  sodium fluoride (LURIDE) 1.1 (0.5 F) MG/ML SOLN Take 1 drop by mouth daily as needed.    Historical Provider, MD    Inpatient Medications:  . azithromycin  500 mg Oral Daily   Followed by  . [START ON 04/07/2016] azithromycin  250 mg Oral q1800  . furosemide  40 mg Intravenous BID   . cefTRIAXone (ROCEPHIN)  IV      Allergies: No Known Allergies  Social History   Social History  . Marital status: Divorced    Spouse name: N/A  . Number of children: N/A  . Years of education: N/A   Occupational History  . Not on file.   Social History Main Topics  . Smoking status: Never Smoker  . Smokeless tobacco: Never Used  . Alcohol use No  . Drug use: No  . Sexual activity: No   Other Topics Concern  . Not on file   Social History Narrative  . No narrative on file     Family History  Problem Relation Age of Onset  . Hypertension Mother   . Hypertension Father   . Hypertension Brother      Review of Systems: Review of Systems  Constitutional: Positive for malaise/fatigue. Negative for chills, diaphoresis, fever and weight loss.  HENT: Negative for congestion.   Eyes: Negative for discharge and redness.  Respiratory: Positive for cough, sputum production and shortness of breath. Negative for wheezing.   Cardiovascular: Positive for chest pain and leg swelling. Negative for palpitations, orthopnea, claudication and PND.  Gastrointestinal: Negative for abdominal pain, heartburn, nausea and vomiting.  Musculoskeletal: Negative for falls and myalgias.  Skin: Negative for rash.  Neurological: Positive for weakness. Negative for dizziness, tingling, tremors, sensory change, speech change, focal weakness and loss of consciousness.  Endo/Heme/Allergies: Does not bruise/bleed easily.  Psychiatric/Behavioral: Negative for substance abuse. The patient is not  nervous/anxious.   All other systems reviewed and are negative.   Labs:  Recent Labs  04/06/16 1205  TROPONINI 0.07*   Lab Results  Component Value Date   WBC 6.4 04/06/2016   HGB 13.2 04/06/2016   HCT 39.3 (L) 04/06/2016   MCV 103.4 (H) 04/06/2016   PLT 248 04/06/2016     Recent Labs Lab 04/06/16 1205  NA 128*  K 3.5  CL 91*  CO2 26  BUN 50*  CREATININE 1.72*  CALCIUM 8.7*  PROT 7.8  BILITOT 5.9*  ALKPHOS 177*  ALT 31  AST 53*  GLUCOSE 264*   No results found for: CHOL, HDL, LDLCALC, TRIG No results found for: DDIMER  Radiology/Studies:  Dg Chest 2 View  Result Date: 04/06/2016 CLINICAL DATA:  Shortness of Breath EXAM: CHEST  2 VIEW COMPARISON:  January 28, 2015 FINDINGS: There is airspace consolidation in the right lower lobe. Lungs elsewhere are clear. There is mild cardiomegaly with pulmonary vascularity within normal limits. There is atherosclerotic calcification in the aorta. No adenopathy. No bone lesions. IMPRESSION: Airspace consolidation in the right lower lobe consistent with pneumonia. Lungs elsewhere clear. Mild cardiomegaly present. There is aortic atherosclerosis. Followup PA and lateral chest radiographs recommended in 3-4 weeks following trial of antibiotic therapy to ensure resolution and exclude underlying malignancy. Electronically Signed   By: Lowella Grip III M.D.   On: 04/06/2016 12:28    EKG: Interpreted by me showed: NSR, 88 bpm, PACs and PVCs, left axis deviation, prolonged QTc at 539 msec, prior inferior and anterior infarct, nonspecific inferolateral st/t changes Telemetry: Interpreted by me showed: NSR, 80's bpm  Weights: Filed Weights   04/06/16 1149  Weight: 158 lb (71.7 kg)     Physical Exam: Blood pressure (!) 110/93, pulse 72, temperature 97.4 F (36.3 C), temperature source Axillary, resp. rate 17, height 5' 6"  (1.676 m), weight 158 lb (71.7 kg), SpO2 100 %. Body mass index is 25.5 kg/m. General: Well developed, well  nourished, in no acute distress. Head: Normocephalic, atraumatic, sclera non-icteric, no xanthomas, nares are without discharge.  Neck: Negative for carotid bruits. JVD elevated to the ear. Lungs: Bilateral crackles along the bases. Decreased breath sounds right base Breathing is unlabored. Heart: RRR with S1 S2. II/VI systolic murmur LLSB radiating to the apex, no rubs, or gallops appreciated. Abdomen: Soft, non-tender, non-distended with normoactive bowel sounds. No hepatomegaly. No rebound/guarding. No obvious abdominal masses. Msk:  Strength and tone appear normal for age. Extremities: No clubbing or cyanosis. 2+ pitting edema to the scrotum. Distal pedal pulses are 2+ and equal bilaterally. Neuro: Alert and oriented X 3. No facial asymmetry. No focal deficit. Moves all extremities spontaneously. Psych:  Responds to questions appropriately with a normal affect.    Assessment and Plan:  Principal Problem:   Acute respiratory distress (HCC) Active Problems:   SOB (shortness of breath)   Acute on chronic combined systolic (congestive) and diastolic (congestive) heart failure (HCC)   Hypertensive heart and kidney disease   Cardiomyopathy, ischemic   CAD S/P percutaneous coronary angioplasty   Malnutrition (HCC)   Hyponatremia   CKD (chronic kidney disease), stage III   Hyperlipidemia   DM2 (diabetes mellitus, type 2) ([place])    1. Acute respiratory distress:  -Likely multifactorial including acute on chronic combined CHF and RLL PNA  -Not requiring supplemental O2 at this time  -Maintain oxygen saturations > 95%   2. Acute on chronic combined CHF:  -Recieved IV Lasix 80 mg x 1 in the ED  -Consider tolvaptan for diuresis given volume overload and hyponatremia. Will  d/w MD -Tolvaptan followed by diuresis  -Check echo  -Continue beta blocker, Entresto, spironolactone as BP allows  3. RLL PNA:  -Per IM   4. Elevated troponin/CAD as above:  -Troponin minimally elevated at 0.07  upon admission  -Likely supply demand ischemia in the setting of volume overload and RLL PNA  -Continue to trend  -Check echo as above -No indication for heparin gtt at this time   4. CKD stage III:  -Monitor with diuresis  -Consider tolvaptan as above  -Consider renal consult if indicated   5. Hypertensive heart disease:  -Controlled  -Continue current medications     Signed, Marcille Blanco [facility] Pager: [phone] 04/06/2016, 4:17 PM

## 2016-04-06 NOTE — ED Notes (Signed)
Admitting MD at bedside.

## 2016-04-06 NOTE — Progress Notes (Signed)
Pt. admitted to unit, [id number] from ED, report from Amy, RN. Oriented to room, call bell, Ascom phones and staff. Bed in low position. Fall safety plan reviewed, blue non-skid socks in place, bed alarm on. Full assessment to Epic; skin assessed with Gwenlyn FoundJessica C., RN. Telemetry box verified with CCMD and Lowella BandyNikki C., NT: [id number] . Will continue to monitor.   Pt reporting 8/10 chest pressure occasionally radiating to jaw, pain that does not worsen with inhalation. SL nitro given x2, pain decreased to 6/10. Pt has significant cardiac history including several PCIs, most recently in 2015. Dr. Amado CoeGouru and Dr. Mariah MillingGollan paged and aware, lab at bedside drawing 2nd troponin. Orders for IV morphine, wait for 2nd troponin. Will continue to monitor and keep MDs updated. Per Dr. Mariah MillingGollan, if troponins trend upwards, pt may need heparin gtt.

## 2016-04-06 NOTE — ED Notes (Signed)
Report called to floor nurse mattie rn.

## 2016-04-06 NOTE — ED Notes (Signed)
Admitting doctor in with pt and family.  meds given.  Pt has voided since lasix.  md aware.

## 2016-04-06 NOTE — ED Triage Notes (Signed)
Patient c/o shortness of breath that is worse with exertion and mid-sternal chest pressure that has been present for 3-4 weeks. Patient went to a senior program who recommended that he come to the hospital. Patient has a history of CHF and states he weighed himself this morning and was 158lb which is a weight gain from 152lb in 3 days.

## 2016-04-07 DIAGNOSIS — I959 Hypotension, unspecified: Secondary | ICD-10-CM

## 2016-04-07 LAB — TROPONIN I
TROPONIN I: 0.09 ng/mL — AB (ref <0.03)
TROPONIN I: 0.06 ng/mL — AB (ref <0.03)

## 2016-04-07 LAB — GLUCOSE, CAPILLARY
Glucose-Capillary: 102 mg/dL — ABNORMAL HIGH (ref 65–99)
Glucose-Capillary: 105 mg/dL — ABNORMAL HIGH (ref 65–99)
Glucose-Capillary: 132 mg/dL — ABNORMAL HIGH (ref 65–99)
Glucose-Capillary: 184 mg/dL — ABNORMAL HIGH (ref 65–99)
Glucose-Capillary: 197 mg/dL — ABNORMAL HIGH (ref 65–99)
Glucose-Capillary: 75 mg/dL (ref 65–99)
Glucose-Capillary: 97 mg/dL (ref 65–99)

## 2016-04-07 LAB — BASIC METABOLIC PANEL
ANION GAP: 9 (ref 5–15)
BUN: 44 mg/dL — ABNORMAL HIGH (ref 6–20)
CALCIUM: 7.9 mg/dL — AB (ref 8.9–10.3)
CO2: 25 mmol/L (ref 22–32)
CREATININE: 1.41 mg/dL — AB (ref 0.61–1.24)
Chloride: 99 mmol/L — ABNORMAL LOW (ref 101–111)
GFR, EST AFRICAN AMERICAN: 58 mL/min — AB (ref >60)
GFR, EST NON AFRICAN AMERICAN: 50 mL/min — AB (ref >60)
Glucose, Bld: 166 mg/dL — ABNORMAL HIGH (ref 65–99)
Potassium: 3.5 mmol/L (ref 3.5–5.1)
SODIUM: 133 mmol/L — AB (ref 135–145)

## 2016-04-07 LAB — CBC
HCT: 36.4 % — ABNORMAL LOW (ref 40.0–52.0)
Hemoglobin: 12.5 g/dL — ABNORMAL LOW (ref 13.0–18.0)
MCH: 34.9 pg — ABNORMAL HIGH (ref 26.0–34.0)
MCHC: 34.3 g/dL (ref 32.0–36.0)
MCV: 101.7 fL — ABNORMAL HIGH (ref 80.0–100.0)
Platelets: 215 10*3/uL (ref 150–440)
RBC: 3.58 MIL/uL — ABNORMAL LOW (ref 4.40–5.90)
RDW: 15.8 % — AB (ref 11.5–14.5)
WBC: 6.2 10*3/uL (ref 3.8–10.6)

## 2016-04-07 LAB — ECHOCARDIOGRAM COMPLETE
HEIGHTINCHES: 66 in
Weight: 2566.4 oz

## 2016-04-07 LAB — MRSA PCR SCREENING: MRSA by PCR: NEGATIVE

## 2016-04-07 MED ORDER — HEPARIN SODIUM (PORCINE) 5000 UNIT/ML IJ SOLN
5000.0000 [IU] | Freq: Three times a day (TID) | INTRAMUSCULAR | Status: DC
  Administered 2016-04-07 – 2016-04-12 (×17): 5000 [IU] via SUBCUTANEOUS
  Filled 2016-04-07 (×17): qty 1

## 2016-04-07 MED ORDER — SODIUM CHLORIDE 0.9 % IV BOLUS (SEPSIS)
500.0000 mL | Freq: Once | INTRAVENOUS | Status: AC
  Administered 2016-04-07: 500 mL via INTRAVENOUS

## 2016-04-07 MED ORDER — AMIODARONE HCL IN DEXTROSE 360-4.14 MG/200ML-% IV SOLN: 30.0000 mg/h | INTRAVENOUS | Status: DC

## 2016-04-07 MED ORDER — DOPAMINE-DEXTROSE 3.2-5 MG/ML-% IV SOLN
0.0000 ug/kg/min | INTRAVENOUS | Status: DC
  Administered 2016-04-07: 10 ug/kg/min via INTRAVENOUS
  Administered 2016-04-07 – 2016-04-10 (×3): 5 ug/kg/min via INTRAVENOUS
  Filled 2016-04-07 (×4): qty 250

## 2016-04-07 MED ORDER — AMIODARONE HCL IN DEXTROSE 360-4.14 MG/200ML-% IV SOLN
30.0000 mg/h | INTRAVENOUS | Status: DC
  Administered 2016-04-07: 30 mg/h via INTRAVENOUS
  Filled 2016-04-07 (×2): qty 200

## 2016-04-07 MED ORDER — DOPAMINE-DEXTROSE 3.2-5 MG/ML-% IV SOLN: 0.0000 ug/kg/min | INTRAVENOUS | Status: DC

## 2016-04-07 MED ORDER — PIPERACILLIN-TAZOBACTAM 3.375 G IVPB
3.3750 g | Freq: Three times a day (TID) | INTRAVENOUS | Status: DC
  Administered 2016-04-07 – 2016-04-09 (×7): 3.375 g via INTRAVENOUS
  Filled 2016-04-07 (×7): qty 50

## 2016-04-07 NOTE — Progress Notes (Signed)
eLink Physician-Brief Progress Note Patient Name: Kyle MilroyWillie Morton DOB: 01/18/1951 MRN: [id number]   Date of Service  04/07/2016  HPI/Events of Note  [age] M with ischemic CM, LVEF 25% in 07/2015 admitted with dyspnea and weight gain. CXR on admission revealed R basilar infltrate. Received furosemide and his usual anti-hypertensive medications. Transferred to ICU with hypothermia and hypotension which did not respond to NS bolus  eICU Interventions  Repeat NS bolus Dopamine infusion - if requires > 10 mcg/kg/min, will need CVL Hold further diuresis and anti-hypertensives Warming blanket PCCM consultation - discussed with ANP     Intervention Category Evaluation Type: New Patient Evaluation  Kyle Morton 04/07/2016, 1:48 AM

## 2016-04-07 NOTE — Progress Notes (Signed)
[facility] [place] [facility]   PATIENT NAME: Kyle Morton    MR#:  [id number]  DATE OF BIRTH:  07/22/1951  SUBJECTIVE:  Doing well.family in the room On dopamine drip UOP 1200cc  REVIEW OF SYSTEMS:   Review of Systems  Constitutional: Negative for chills, fever and weight loss.  HENT: Negative for ear discharge, ear pain and nosebleeds.   Eyes: Negative for blurred vision, pain and discharge.  Respiratory: Positive for shortness of breath. Negative for sputum production, wheezing and stridor.   Cardiovascular: Positive for orthopnea and leg swelling. Negative for chest pain, palpitations and PND.  Gastrointestinal: Negative for abdominal pain, diarrhea, nausea and vomiting.  Genitourinary: Negative for frequency and urgency.  Musculoskeletal: Negative for back pain and joint pain.  Neurological: Positive for weakness. Negative for sensory change, speech change and focal weakness.  Psychiatric/Behavioral: Negative for depression and hallucinations. The patient is not nervous/anxious.    Tolerating Diet:yes Tolerating PT: pending  DRUG ALLERGIES:  No Known Allergies  VITALS:  Blood pressure 104/88, pulse 78, temperature 97.6 F (36.4 C), temperature source Oral, resp. rate 17, height 5\' 6"  (1.676 m), weight 74.4 kg (164 lb 0.4 oz), SpO2 99 %.  PHYSICAL EXAMINATION:   Physical Exam  GENERAL:  65 y.o.-year-old patient lying in the bed with no acute distress.  EYES: Pupils equal, round, reactive to light and accommodation. No scleral icterus. Extraocular muscles intact.  HEENT: Head atraumatic, normocephalic. Oropharynx and nasopharynx clear.  NECK:  Supple, no jugular venous distention. No thyroid enlargement, no tenderness.  LUNGS: Normal breath sounds bilaterally, no wheezing,bilateral rales,no rhonchi.  No use of accessory muscles of respiration.  CARDIOVASCULAR: S1, S2 normal. No murmurs, rubs, or gallops.  ABDOMEN: Soft, nontender,  nondistended. Bowel sounds present. No organomegaly or mass.  EXTREMITIES: No cyanosis, clubbing  ++edema b/l.    NEUROLOGIC: Cranial nerves II through XII are intact. No focal Motor or sensory deficits b/l.   PSYCHIATRIC:  patient is alert and oriented x 3.  SKIN: No obvious rash, lesion, or ulcer.   LABORATORY PANEL:  CBC  Recent Labs Lab 04/07/16 0455  WBC 6.2  HGB 12.5*  HCT 36.4*  PLT 215    Chemistries   Recent Labs Lab 04/06/16 1205 04/06/16 1721 04/07/16 0455  NA 128*  --  133*  K 3.5  --  3.5  CL 91*  --  99*  CO2 26  --  25  GLUCOSE 264*  --  166*  BUN 50*  --  44*  CREATININE 1.72*  --  1.41*  CALCIUM 8.7*  --  7.9*  MG  --  2.1  --   AST 53*  --   --   ALT 31  --   --   ALKPHOS 177*  --   --   BILITOT 5.9*  --   --    Cardiac Enzymes  Recent Labs Lab 04/07/16 0455  TROPONINI 0.06*   RADIOLOGY:  Dg Chest 2 View  Result Date: 04/06/2016 CLINICAL DATA:  Shortness of Breath EXAM: CHEST  2 VIEW COMPARISON:  January 28, 2015 FINDINGS: There is airspace consolidation in the right lower lobe. Lungs elsewhere are clear. There is mild cardiomegaly with pulmonary vascularity within normal limits. There is atherosclerotic calcification in the aorta. No adenopathy. No bone lesions. IMPRESSION: Airspace consolidation in the right lower lobe consistent with pneumonia. Lungs elsewhere clear. Mild cardiomegaly present. There is aortic atherosclerosis. Followup PA and lateral chest radiographs recommended in  3-4 weeks following trial of antibiotic therapy to ensure resolution and exclude underlying malignancy. Electronically Signed   By: Bretta Bang III M.D.   On: 04/06/2016 12:28   ASSESSMENT AND PLAN:  1. Acute respiratory distress: -Transferred to step-down overnight 2/2 hypotension and hypothermia  -On 3.5 L via nasal cannula, wean as able -recieved lasix y'day -on dopamine gtt  2. Acute on chronic combined CHF -UOP of 1.2L overnight -Diuresis on hold at  this time 2/2 hypotension requiring dopamine infusion -Restart IV or oral diuresis when able -Na has improved to 133 this morning -BB and Entresto on hold -Echo pending  3. Sepsis 2/2 RLL PNA/hypotension/hypothermia: -Continue pressor support as above -cont zosyn and zithromax  4. Elevated troponin/CAD: -No symptoms of angina -Troponin mildly elevated and flat trending likely in the setting of supply demand ischemia   5. CKD stage III: -Improved this morning with diuresis -Watch for bump in renal function over the next couple of days given hypotension with SBP in the 60s overnight  6. Hypertensive heart disease: -BP soft as above -Antihypertensives on hold   Case discussed with Care Management/Social Worker. Management plans discussed with the patient, family and they are in agreement.  CODE STATUS: full  DVT Prophylaxis: lovenox  TOTAL critical TIME TAKING CARE OF THIS PATIENT: 35 minutes.  >50% time spent on counselling and coordination of care pt and family  POSSIBLE D/C IN 2-3 DAYS, DEPENDING ON CLINICAL CONDITION.  Note: This dictation was prepared with Dragon dictation along with smaller phrase technology. Any transcriptional errors that result from this process are unintentional.  Kyle Morton M.D on 04/07/2016 at 2:54 PM  Between 7am to 6pm - Pager - [phone]  After 6pm go to [email or web address] - password [email or web address] [facility]  [facility]  Office  [phone]  CC: Primary care physician; Toya Smothers, PA

## 2016-04-07 NOTE — Progress Notes (Signed)
Pharmacy Antibiotic Note  Kyle MilroyWillie Morton is a 65 y.o. male admitted on 04/06/2016 with pneumonia.  Pharmacy has been consulted for Zosyn dosing. Patient is also on azithromycin.   Plan: Zosyn 3.375g IV q8h (4 hour infusion).  Height: 5\' 6"  (167.6 cm) Weight: 164 lb 0.4 oz (74.4 kg) IBW/kg (Calculated) : 63.8  Temp (24hrs), Avg:97.2 F (36.2 C), Min:93.7 F (34.3 C), Max:98.4 F (36.9 C)   Recent Labs Lab 04/06/16 1205 04/07/16 0455  WBC 6.4 6.2  CREATININE 1.72* 1.41*    Estimated Creatinine Clearance: 45.9 mL/min (by C-G formula based on SCr of 1.41 mg/dL).    No Known Allergies  Antimicrobials this admission: Ceftriaxone 9/5 >> x 2 azithromycin 9/6 >>  Zosyn 9/6 >>  Dose adjustments this admission:   Microbiology results: 9/6 BCx: NGTD x 2 9/6 MRSA PCR: negative  Thank you for allowing pharmacy to be a part of this patient's care.  Valentina GuChristy, Ama Mcmaster D 04/07/2016 2:47 PM

## 2016-04-07 NOTE — Progress Notes (Signed)
Patient has tolerated amio gtt well so far. No decrease in BP or HR. Patient has had no complaints today, rested well and visited with family.  Trudee KusterBrandi R Mansfield

## 2016-04-07 NOTE — Care Management Note (Signed)
Case Management Note  Patient Details  Name: Kyle Morton MRN: [id number] Date of Birth: 10-05-1950  Subjective/Objective:                  Met with patient to discuss discharge planning. He is a [employer] patient and wishes to stay at [facility] for treatment. I have notified and faxed (with patient's permission) to [facility] Norina Buzzard). Patient states he is able to drive. He uses a rollator to ambulate. His daughter Levada Dy is his POA per patient [phone]. His PCP is with [facility]. He is not on home O2 and has never received home health services. He denies being home bound at this time.  Action/Plan: RNCM will continue to follow for O2 needs. RNCM to fax discharge summary to [facility] when discharged.  Expected Discharge Date:                  Expected Discharge Plan:     In-House Referral:     Discharge planning Services  CM Consult  Post Acute Care Choice:    Choice offered to:  Patient  DME Arranged:    DME Agency:     HH Arranged:    [place] Agency:     Status of Service:  In process, will continue to follow  If discussed at Long Length of Stay Meetings, dates discussed:    Additional Comments:  Marshell Garfinkel, RN 04/07/2016, 11:11 AM

## 2016-04-07 NOTE — Progress Notes (Signed)
Patient: Kyle Morton / Admit Date: 04/06/2016 / Date of Encounter: 04/07/2016, 7:43 AM   Subjective: Transferred to step-down overnight 2/2 hypotension with SBP in the 60's mm Hg and hypothermia with temperature 93.7. Did not respond to IV fluids. Started pn dopamine gtt per PCCM. BP remains in the 90's systolic. SOB remains the same. UOP of 1 L overnight. Received IV Lasix 80 mg and 40 mg at admission. Antihypertensives have been held. Not receiving diuresis at this time. Troponin mildly elevated with a flat trend, peak 0.09. Renal function improved with diuresis. Na improved.    Review of Systems: Review of Systems  Constitutional: Positive for malaise/fatigue and weight loss. Negative for chills, diaphoresis and fever.  HENT: Negative for congestion.   Eyes: Negative for discharge and redness.  Respiratory: Positive for cough, sputum production and shortness of breath. Negative for wheezing.   Cardiovascular: Negative for chest pain, palpitations, orthopnea, claudication, leg swelling and PND.  Gastrointestinal: Negative for abdominal pain, heartburn, nausea and vomiting.  Musculoskeletal: Negative for falls and myalgias.  Skin: Negative for rash.  Neurological: Positive for dizziness and weakness. Negative for tingling, tremors, sensory change, speech change, focal weakness and loss of consciousness.  Endo/Heme/Allergies: Does not bruise/bleed easily.  Psychiatric/Behavioral: Negative for substance abuse. The patient is not nervous/anxious.   All other systems reviewed and are negative.   Objective: Telemetry: NSR, 80's with PACs and PVCs Physical Exam: Blood pressure 90/62, pulse 68, temperature 98.2 F (36.8 C), temperature source Oral, resp. rate 15, height 5\' 6"  (1.676 m), weight 74.4 kg (164 lb 0.4 oz), SpO2 97 %. Body mass index is 26.47 kg/m. General: Well developed, well nourished, in no acute distress. Head: Normocephalic, atraumatic, sclera non-icteric, no xanthomas,  nares are without discharge. Neck: Negative for carotid bruits. JVP elevated to the ear. Lungs: Crackles along bilateral bases, decreased breath sounds right base. Breathing is unlabored on 3.5 L oxygen via nasal cannula.  Heart: RRR S1 S2 without murmurs, rubs, or gallops.  Abdomen: Soft, non-tender, distended with normoactive bowel sounds. No rebound/guarding. Extremities: No clubbing or cyanosis. 2+ pitting edema to the bilateral hips. Distal pedal pulses are 2+ and equal bilaterally. Neuro: Alert and oriented X 3. Moves all extremities spontaneously. Psych:  Responds to questions appropriately with a normal affect.   Intake/Output Summary (Last 24 hours) at 04/07/16 0743 Last data filed at 04/07/16 0620  Gross per 24 hour  Intake           334.83 ml  Output             1400 ml  Net         -1065.17 ml    Inpatient Medications:  . aspirin EC  81 mg Oral Daily  . atorvastatin  80 mg Oral Daily  . azithromycin  250 mg Oral q1800  . cefTRIAXone (ROCEPHIN)  IV  1 g Intravenous Q24H  . cholecalciferol  2,000 Units Oral Daily  . clopidogrel  75 mg Oral Daily  . heparin subcutaneous  5,000 Units Subcutaneous Q8H  . insulin aspart  0-5 Units Subcutaneous QHS  . insulin aspart  0-9 Units Subcutaneous TID WC  . insulin aspart  10 Units Subcutaneous TID AC  . insulin glargine  25 Units Subcutaneous QHS  . trolamine salicylate   Topical BID  . venlafaxine XR  150 mg Oral Q breakfast   Infusions:  . DOPamine 6 mcg/kg/min (04/07/16 0620)    Labs:  Recent Labs  04/06/16 1205 04/06/16  1721 04/07/16 0455  NA 128*  --  133*  K 3.5  --  3.5  CL 91*  --  99*  CO2 26  --  25  GLUCOSE 264*  --  166*  BUN 50*  --  44*  CREATININE 1.72*  --  1.41*  CALCIUM 8.7*  --  7.9*  MG  --  2.1  --     Recent Labs  04/06/16 1205  AST 53*  ALT 31  ALKPHOS 177*  BILITOT 5.9*  PROT 7.8  ALBUMIN 2.8*    Recent Labs  04/06/16 1205 04/07/16 0455  WBC 6.4 6.2  HGB 13.2 12.5*  HCT  39.3* 36.4*  MCV 103.4* 101.7*  PLT 248 215    Recent Labs  04/06/16 1205 04/06/16 1721 04/06/16 2317 04/07/16 0455  TROPONINI 0.07* 0.08* 0.09* 0.06*   Invalid input(s): POCBNP No results for input(s): HGBA1C in the last 72 hours.   Weights: Filed Weights   04/06/16 1149 04/06/16 1723 04/07/16 0132  Weight: 71.7 kg (158 lb) 72.8 kg (160 lb 6.4 oz) 74.4 kg (164 lb 0.4 oz)     Radiology/Studies:  Dg Chest 2 View  Result Date: 04/06/2016 CLINICAL DATA:  Shortness of Breath EXAM: CHEST  2 VIEW COMPARISON:  January 28, 2015 FINDINGS: There is airspace consolidation in the right lower lobe. Lungs elsewhere are clear. There is mild cardiomegaly with pulmonary vascularity within normal limits. There is atherosclerotic calcification in the aorta. No adenopathy. No bone lesions. IMPRESSION: Airspace consolidation in the right lower lobe consistent with pneumonia. Lungs elsewhere clear. Mild cardiomegaly present. There is aortic atherosclerosis. Followup PA and lateral chest radiographs recommended in 3-4 weeks following trial of antibiotic therapy to ensure resolution and exclude underlying malignancy. Electronically Signed   By: Bretta Bang III M.D.   On: 04/06/2016 12:28     Assessment and Plan  Principal Problem:   Acute respiratory distress (HCC) Active Problems:   SOB (shortness of breath)   Acute on chronic combined systolic (congestive) and diastolic (congestive) heart failure (HCC)   Hypertensive heart and kidney disease   Cardiomyopathy, ischemic   CAD S/P percutaneous coronary angioplasty   Malnutrition (HCC)   Hyponatremia   CKD (chronic kidney disease), stage III   Hyperlipidemia   DM2 (diabetes mellitus, type 2) (HCC)    1. Acute respiratory distress: -Transferred to step-down overnight 2/2 hypotension and hypothermia  -On 3.5 L via nasal cannula, wean as able  2. Acute on chronic combined CHF: -UOP of 1 L overnight -Diuresis on hold at this time 2/2  hypotension requiring dopamine infusion -Continue dopamine at this time -Restart IV diuresis when able -Na has improved to 133 this morning, may be able to avoid tolvaptan  -BB and Entresto on hold -Echo pending  3. Sepsis 2/2 RLL PNA/hypotension/hypothermia: -qSOFA score of 2 -Continue pressor support as above -ABX per IM  4. Elevated troponin/CAD: -No symptoms of angina -Troponin mildly elevated and flat trending likely in the setting of supply demand ischemia 2/2 #2 and #3  5. CKD stage III: -Improved this morning with diuresis -Watch for bump in renal function over the next couple of days given hypotension with SBP in the 60s overnight  6. Hypertensive heart disease: -BP soft as above -Antihypertensives on hold   Signed, Carola Frost [facility] Pager: [phone] 04/07/2016, 7:43 AM

## 2016-04-07 NOTE — Progress Notes (Signed)
Patient ID: Kyle Morton, male   DOB: 12/19/1950, 65 y.o.   MRN: [id number] Called to evaluate patient for SBP 50s-60s and complaint of fatigue and lightheadedness.  Patient rec'd 250cc of a 500 cc bolus without much improvement in BP.  Rec'd Imdur and Coreg this evening.   Will start dopamine for a target MAP of 65 or SBP 90, transfer to stepdown and consider intensivist consult if not improving.  Hold antihypertensives for now.

## 2016-04-07 NOTE — Progress Notes (Signed)
Pt transferred from room 259 due to low bp and hypothermia.  Pt started on dopamine drip which is now running at 67mcg/kg/min.  Pt has rested comfortably and is now more alert.  Pt's temperature is now WNL.

## 2016-04-07 NOTE — Consult Note (Signed)
PULMONARY / CRITICAL CARE MEDICINE   Name: [name] MRN: [id number] DOB: Feb 22, 1951    ADMISSION DATE:  04/06/2016 CONSULTATION DATE:  04/07/16  REFERRING MD:  Dr. Lenord Fellers  CHIEF COMPLAINT:  Hypotension  HISTORY OF PRESENT ILLNESS:   [name] is a 65 year old AA male with Hx of Carotid Artery Disease,Chronic systolic and Diastolic heart failure,Chronic Kidney Disease Stage- 3, GERD, hyperlipidemia, ischemic cardiomyopathy. Patient presented to the ED on 9/5 with progressively worsening of shortness of breath associated with chest pressure for about 3-4 weeks. He also reported a weight gain of 9 pounds. His cardiologist Dr. Recommended IV Lasix due to CHF exacerbation. Patient was also diagnosed with right lower lobe pneumonia.Patient   received his usual antihypertensive medications . Patient was transferred from the floor due to hypotension and hypothermia which did not respond normal saline bolus. [facility] M team was consulted for further management. PAST MEDICAL HISTORY :  He  has a past medical history of Benign essential HTN; Carotid artery stenosis; Chronic combined systolic and diastolic CHF (congestive heart failure) (HCC); CKD (chronic kidney disease), stage III; Coronary artery disease; Depression; DM2 (diabetes mellitus, type 2) (HCC); GERD (gastroesophageal reflux disease); Gout; Hyperlipidemia; Ischemic cardiomyopathy; Pleural effusion; PVC's (premature ventricular contractions); QT prolongation; and Vitamin D deficiency.  PAST SURGICAL HISTORY: He  has a past surgical history that includes Coronary stent placement (02/19/2015); Cardiac catheterization (4098,1191); Coronary angioplasty with stent (06/05/2012); Coronary angioplasty with stent (04/25/2006); Cardiac catheterization (N/A, 01/07/2015); Cardiac catheterization (N/A, 02/19/2015); and Coronary angioplasty (02/19/2015).  No Known Allergies  No current facility-administered medications on file prior to encounter.     Current Outpatient Prescriptions on File Prior to Encounter  Medication Sig  . allopurinol (ZYLOPRIM) 100 MG tablet Take 200 mg by mouth daily.   Marland Kitchen aspirin EC 81 MG tablet Take 81 mg by mouth daily.  Marland Kitchen atorvastatin (LIPITOR) 80 MG tablet Take 80 mg by mouth daily.  . carvedilol (COREG) 12.5 MG tablet Take 12.5 mg by mouth 2 (two) times daily with a meal.  . cetirizine (ZYRTEC) 10 MG tablet Take 10 mg by mouth daily.  . clopidogrel (PLAVIX) 75 MG tablet Take 1 tablet (75 mg total) by mouth daily.  . colchicine 0.6 MG tablet Take 0.6 mg by mouth as needed.   Marland Kitchen dextrose (GLUTOSE) 40 % GEL Take 1 Tube by mouth once as needed for low blood sugar.  . fluticasone (FLONASE) 50 MCG/ACT nasal spray Place 2 sprays into both nostrils daily as needed for allergies or rhinitis.   Marland Kitchen Hexylresorcinol (THROAT LOZENGES MT) Use as directed 1 Dose in the mouth or throat every 4 (four) hours as needed (for sore throat).  . insulin aspart (NOVOLOG) 100 UNIT/ML injection Inject 10 Units into the skin 3 (three) times daily before meals.  . insulin glargine (LANTUS) 100 UNIT/ML injection Inject 25 Units into the skin at bedtime.  . isosorbide mononitrate (IMDUR) 60 MG 24 hr tablet Take 1 tablet (60 mg total) by mouth daily.  . potassium chloride SA (K-DUR,KLOR-CON) 20 MEQ tablet Take 1 tablet (20 mEq total) by mouth 2 (two) times daily.  Marland Kitchen spironolactone (ALDACTONE) 25 MG tablet Take 25 mg by mouth daily.   Marland Kitchen torsemide (DEMADEX) 20 MG tablet Take 1 tablet (20 mg total) by mouth daily. Take 10mg  daily by mouth for now. Hold dose if weight is less than 146 pounds.  . traZODone (DESYREL) 100 MG tablet Take 100 mg by mouth at bedtime.  Marland Kitchen venlafaxine XR (EFFEXOR-XR)  150 MG 24 hr capsule Take 150 mg by mouth daily with breakfast.  . Vitamin D, Cholecalciferol, 1000 UNITS CAPS Take 2,000 Units by mouth daily.  . Menthol-Methyl Salicylate (THERA-GESIC EX) Apply 1 Dose topically 2 (two) times daily.   . nitroGLYCERIN  (NITROSTAT) 0.4 MG SL tablet Place 1 tablet (0.4 mg total) under the tongue every 5 (five) minutes as needed for chest pain.  . sacubitril-valsartan (ENTRESTO) 24-26 MG Take 1 tablet by mouth 2 (two) times daily.  . sodium fluoride (LURIDE) 1.1 (0.5 F) MG/ML SOLN Take 1 drop by mouth daily as needed.    FAMILY HISTORY:  His indicated that his mother is deceased. He indicated that his father is deceased. He indicated that his brother is alive.    SOCIAL HISTORY: He  reports that he has never smoked. He has never used smokeless tobacco. He reports that he does not drink alcohol or use drugs.  REVIEW OF SYSTEMS:   Review of Systems  Constitutional: Positive for malaise/fatigue. Negative for chills and fever.  HENT: Negative for congestion and ear discharge.   Eyes: Negative for double vision and photophobia.  Respiratory: Negative for sputum production.   Cardiovascular: Negative for orthopnea and claudication.  Gastrointestinal: Negative for constipation, diarrhea and vomiting.  Genitourinary: Negative for frequency and hematuria.  Musculoskeletal: Negative for back pain and joint pain.  Neurological: Negative for sensory change and speech change.  Psychiatric/Behavioral: Negative for hallucinations and substance abuse.     SUBJECTIVE:  "Patient states that he feels little dizzy but otherwise okay"  VITAL SIGNS: BP (!) 61/44 (BP Location: Left Arm)   Pulse (!) 54   Temp (!) 93.7 F (34.3 C) (Oral)   Resp 18   Ht 5\' 6"  (1.676 m)   Wt 164 lb 0.4 oz (74.4 kg)   SpO2 95%   BMI 26.47 kg/m   HEMODYNAMICS:    VENTILATOR SETTINGS:    INTAKE / OUTPUT: I/O last 3 completed shifts: In: -  Out: 500 [Urine:500]  PHYSICAL EXAMINATION: General:  AA male, in no acute distress Neuro:  Awake, alert, oriented, follows command, no focal deficits HEENT:  Atraumatic, normocephalic,no JVD appreciated Cardiovascular:  S1S2, RRR, No MRG noted Lungs: diminished right lower base but  otherwise clear, no wheezes, crackles, rhonchi noted Abdomen:  Soft, non tender, active bowel sounds Musculoskeletal:  No inflammation/deformity noted Skin:  Grossly intact  LABS:  BMET  Recent Labs Lab 04/06/16 1205  NA 128*  K 3.5  CL 91*  CO2 26  BUN 50*  CREATININE 1.72*  GLUCOSE 264*    Electrolytes  Recent Labs Lab 04/06/16 1205 04/06/16 1721  CALCIUM 8.7*  --   MG  --  2.1    CBC  Recent Labs Lab 04/06/16 1205  WBC 6.4  HGB 13.2  HCT 39.3*  PLT 248    Coag's No results for input(s): APTT, INR in the last 168 hours.  Sepsis Markers No results for input(s): LATICACIDVEN, PROCALCITON, O2SATVEN in the last 168 hours.  ABG No results for input(s): PHART, PCO2ART, PO2ART in the last 168 hours.  Liver Enzymes  Recent Labs Lab 04/06/16 1205  AST 53*  ALT 31  ALKPHOS 177*  BILITOT 5.9*  ALBUMIN 2.8*    Cardiac Enzymes  Recent Labs Lab 04/06/16 1205 04/06/16 1721 04/06/16 2317  TROPONINI 0.07* 0.08* 0.09*    Glucose  Recent Labs Lab 04/06/16 1753 04/06/16 2055 04/06/16 2343 04/07/16 0140  GLUCAP 255* 176* 158* 132*    Imaging  Dg Chest 2 View  Result Date: 04/06/2016 CLINICAL DATA:  Shortness of Breath EXAM: CHEST  2 VIEW COMPARISON:  January 28, 2015 FINDINGS: There is airspace consolidation in the right lower lobe. Lungs elsewhere are clear. There is mild cardiomegaly with pulmonary vascularity within normal limits. There is atherosclerotic calcification in the aorta. No adenopathy. No bone lesions. IMPRESSION: Airspace consolidation in the right lower lobe consistent with pneumonia. Lungs elsewhere clear. Mild cardiomegaly present. There is aortic atherosclerosis. Followup PA and lateral chest radiographs recommended in 3-4 weeks following trial of antibiotic therapy to ensure resolution and exclude underlying malignancy. Electronically Signed   By: Bretta Bang III M.D.   On: 04/06/2016 12:28     STUDIES:  07/29/15 ECHO>>EF  is still very low at 20 to 25%, unchanged from before  Noted by Dr. Mariah Milling  CULTURES: none  ANTIBIOTICS: 9/5 ceftriaxone>> 9/5 azithromycin>>  SIGNIFICANT EVENTS: 9/5 Patient admitted to [facility] ICU with hypotension and hypothermia  LINES/TUBES: None  DISCUSSION: 65 yo male transferred from the floor due to hypotension and hypothermia, not responding to fluids.  Started on Dopamine peripherally. If requires>80mcg/kg/min, will need CVL  ASSESSMENT / PLAN:   CARDIOVASCULAR A:   Cardiogenic shock Chronic systolic and diastolic heart failure with EF of 20-25% Coronary Artery Disease Hx of Hyperlipidemia Elevated Troponin  P:  Continuous telemetry Received 1.5 liters of fluid bolus Dopamine gtt Keep MAP goals>65 Continue aspirin Hold diuresis for now Hold imdur / Coreg/Spironolactone/entresto Trend troponin Continue lipitor Strict I/o Daily weight Pending ECHO  Last catheterization 1 year ago, no intervention at that time.  Follow cardiology recommendation  PULMONARY A: Right lower lobe PNA Acute respiratory Distress. P:   Support with Oxygen to keep sats>94% Continue Ceftriaxone/Azithromycin Follow sputum culture  RENAL A:   Chronic Kidney Disease- Stage 3 hyponatremia P:   Hold home med allopurinol and colchicine Monitor renal function  Monitor Serial Sodiums Follow BMET    GASTROINTESTINAL A:   Hx  Of GERD P:   HH diet  HEMATOLOGIC A:   No active issues P:  Heparin for DVT prophylaxis Transfuse if Hgb<7  INFECTIOUS A:   Community Acquired PNA hypothermia P:   Continue ceftriaxone/azitromycin Follow  culture Monitor fever curve Follow CBC  ENDOCRINE A:   Hx of DM P:   Blood sugar checks ACHS SSI Coverage Novlog 10 units tid before meals Lantus 25 units at bedtime  NEUROLOGIC A:   Hx of Depression P:   Continue Venlafaxine- XR   FAMILY  - Updates: Daughter was updated regarding patient's  status.     Bincy Varughese,AG-ACNP Pulmonary and Critical Care Medicine [facility]   04/07/2016, 1:59 AM  Patient seen and examined with NP, agree with assessment and plan. Patient has a history of chronic systolic CHF, admitted with the acute CHF, patient became hypotensive on the floor, subsequently required fluid boluses. Currently is on dopamine drip with adequate blood pressure. Appears to have a right lower lobe pneumonia with mild sepsis complicating his congestive heart failure with acute on chronic kidney disease. We'll continue IV antibiotics, wean down pressors as tolerated.  Wells Guiles, M.D.  04/07/2016   Critical Care Attestation.  I have personally obtained a history, examined the patient, evaluated laboratory and imaging results, formulated the assessment and plan and placed orders. The Patient requires high complexity decision making for assessment and support, frequent evaluation and titration of therapies, application of advanced monitoring technologies and extensive interpretation of multiple databases. The patient  has critical illness that could lead imminently to failure of 1 or more organ systems and requires the highest level of physician preparedness to intervene.  Critical Care Time devoted to patient care services described in this note is 35 minutes and is exclusive of time spent in procedures.

## 2016-04-07 NOTE — Progress Notes (Signed)
Per Dr. Mariah MillingGollan start amio infusion now, not at 2115. Order modified. Will start amio shortly. Trudee KusterBrandi R Mansfield

## 2016-04-07 NOTE — Progress Notes (Signed)
Patient went to use urinal. Per nurse tech patient seeming drowsy and just not how he did earlier. CBG checked 158. BP taken 60s/40s ( see flowsheet for vitals) . 500 bolus of saline started. MD notified. Manuel BP checked. No change. 2nd bolus started. No change in vitals. No changes in lung sounds. No changes in 02 saturation. Pt states" I feel ok". MD notified. Acknowledged. Came to assess patient. New orders placed.Family verbalized understanding of care. Transported to CCU.

## 2016-04-08 DIAGNOSIS — I5023 Acute on chronic systolic (congestive) heart failure: Secondary | ICD-10-CM

## 2016-04-08 LAB — CBC
HCT: 40.4 % (ref 40.0–52.0)
Hemoglobin: 13.7 g/dL (ref 13.0–18.0)
MCH: 34.4 pg — ABNORMAL HIGH (ref 26.0–34.0)
MCHC: 34 g/dL (ref 32.0–36.0)
MCV: 101.2 fL — ABNORMAL HIGH (ref 80.0–100.0)
Platelets: 253 10*3/uL (ref 150–440)
RBC: 3.99 MIL/uL — ABNORMAL LOW (ref 4.40–5.90)
RDW: 15.8 % — AB (ref 11.5–14.5)
WBC: 7.8 10*3/uL (ref 3.8–10.6)

## 2016-04-08 LAB — BASIC METABOLIC PANEL
Anion gap: 7 (ref 5–15)
BUN: 50 mg/dL — AB (ref 6–20)
CALCIUM: 8.4 mg/dL — AB (ref 8.9–10.3)
CHLORIDE: 98 mmol/L — AB (ref 101–111)
CO2: 26 mmol/L (ref 22–32)
Creatinine, Ser: 1.75 mg/dL — ABNORMAL HIGH (ref 0.61–1.24)
GFR, EST AFRICAN AMERICAN: 45 mL/min — AB (ref >60)
GFR, EST NON AFRICAN AMERICAN: 39 mL/min — AB (ref >60)
Glucose, Bld: 131 mg/dL — ABNORMAL HIGH (ref 65–99)
POTASSIUM: 3.5 mmol/L (ref 3.5–5.1)
Sodium: 131 mmol/L — ABNORMAL LOW (ref 135–145)

## 2016-04-08 LAB — GLUCOSE, CAPILLARY
GLUCOSE-CAPILLARY: 129 mg/dL — AB (ref 65–99)
Glucose-Capillary: 222 mg/dL — ABNORMAL HIGH (ref 65–99)
Glucose-Capillary: 224 mg/dL — ABNORMAL HIGH (ref 65–99)
Glucose-Capillary: 93 mg/dL (ref 65–99)

## 2016-04-08 LAB — PHOSPHORUS: PHOSPHORUS: 4.4 mg/dL (ref 2.5–4.6)

## 2016-04-08 LAB — MAGNESIUM: MAGNESIUM: 2.3 mg/dL (ref 1.7–2.4)

## 2016-04-08 MED ORDER — DOCUSATE SODIUM 100 MG PO CAPS
100.0000 mg | ORAL_CAPSULE | Freq: Two times a day (BID) | ORAL | Status: DC
  Administered 2016-04-09 – 2016-04-12 (×7): 100 mg via ORAL
  Filled 2016-04-08 (×7): qty 1

## 2016-04-08 MED ORDER — INSULIN GLARGINE 100 UNIT/ML ~~LOC~~ SOLN
30.0000 [IU] | Freq: Every day | SUBCUTANEOUS | Status: DC
  Filled 2016-04-08: qty 0.3

## 2016-04-08 MED ORDER — POLYETHYLENE GLYCOL 3350 17 G PO PACK
17.0000 g | PACK | Freq: Every day | ORAL | Status: DC | PRN
  Administered 2016-04-08: 17 g via ORAL
  Filled 2016-04-08: qty 1

## 2016-04-08 MED ORDER — INSULIN GLARGINE 100 UNIT/ML ~~LOC~~ SOLN
25.0000 [IU] | Freq: Every day | SUBCUTANEOUS | Status: DC
  Administered 2016-04-08: 25 [IU] via SUBCUTANEOUS
  Filled 2016-04-08 (×2): qty 0.25

## 2016-04-08 MED ORDER — AMIODARONE HCL 200 MG PO TABS
400.0000 mg | ORAL_TABLET | Freq: Two times a day (BID) | ORAL | Status: DC
  Administered 2016-04-08 – 2016-04-10 (×5): 400 mg via ORAL
  Filled 2016-04-08 (×5): qty 2

## 2016-04-08 NOTE — Progress Notes (Signed)
Dr Mariah MillingGollan has requested that patient's dopamine drip infuse at least 5 mc/min, as his goal is to perfuse kidneys enough to start a dose of lasix, facilitating diuresis.  Dopamine currently infusing at 7 mc/min, SBP and MAP well above goal.  Will titrate to 6 this shift as long as tolerated.

## 2016-04-08 NOTE — Progress Notes (Signed)
Per Dr SummVaughan Bastaer hold 10 U reg insulin for CBG of 93.  He will also reduce Lantus back to 25 [place] bedtime because patient does not seem to have a trend of CBGs in 200s.  CBG last night was actually 75.

## 2016-04-08 NOTE — Progress Notes (Signed)
eLink Physician-Brief Progress Note Patient Name: Kyle MilroyWillie Maynor DOB: 03/09/1951 MRN: [id number]   Date of Service  04/08/2016  HPI/Events of Note  Blood glucose = 93. Patient eating.   eICU Interventions  Will order: 1. Hold meal coverage tonight. 2. Decrease Lantus back to 25 units Q HS.     Intervention Category Intermediate Interventions: Hyperglycemia - evaluation and treatment  Farah Lepak [place] 04/08/2016, 5:23 PM

## 2016-04-08 NOTE — Progress Notes (Signed)
[facility]   PATIENT NAME: Kyle Morton    MR#:  [id number]  DATE OF BIRTH:  [birth year]  SUBJECTIVE:  Feels weak On dopamine drip UOP 1500cc On amiodarone gtt due to freq ectopy  REVIEW OF SYSTEMS:   Review of Systems  Constitutional: Negative for chills, fever and weight loss.  HENT: Negative for ear discharge, ear pain and nosebleeds.   Eyes: Negative for blurred vision, pain and discharge.  Respiratory: Positive for shortness of breath. Negative for sputum production, wheezing and stridor.   Cardiovascular: Positive for orthopnea and leg swelling. Negative for chest pain, palpitations and PND.  Gastrointestinal: Negative for abdominal pain, diarrhea, nausea and vomiting.  Genitourinary: Negative for frequency and urgency.  Musculoskeletal: Negative for back pain and joint pain.  Neurological: Positive for weakness. Negative for sensory change, speech change and focal weakness.  Psychiatric/Behavioral: Negative for depression and hallucinations. The patient is not nervous/anxious.    Tolerating Diet:yes Tolerating PT: pending  DRUG ALLERGIES:  No Known Allergies  VITALS:  Blood pressure 107/85, pulse 75, temperature 97.7 F (36.5 C), temperature source Axillary, resp. rate 19, height 5\' 6"  (1.676 m), weight 74.4 kg (164 lb 0.4 oz), SpO2 97 %.  PHYSICAL EXAMINATION:   Physical Exam  GENERAL:  65 y.o.-year-old patient lying in the bed with no acute distress.  EYES: Pupils equal, round, reactive to light and accommodation. No scleral icterus. Extraocular muscles intact.  HEENT: Head atraumatic, normocephalic. Oropharynx and nasopharynx clear.  NECK:  Supple, no jugular venous distention. No thyroid enlargement, no tenderness.  LUNGS: Normal breath sounds bilaterally, no wheezing,bilateral rales,no rhonchi.  No use of accessory muscles of respiration.  CARDIOVASCULAR: S1, S2 normal. No murmurs, rubs, or gallops.  ABDOMEN:  Soft, nontender, nondistended. Bowel sounds present. No organomegaly or mass.  EXTREMITIES: No cyanosis, clubbing  ++edema b/l.    NEUROLOGIC: Cranial nerves II through XII are intact. No focal Motor or sensory deficits b/l.   PSYCHIATRIC:  patient is alert and oriented x 3.  SKIN: No obvious rash, lesion, or ulcer.   LABORATORY PANEL:  CBC  Recent Labs Lab 04/08/16 0312  WBC 7.8  HGB 13.7  HCT 40.4  PLT 253    Chemistries   Recent Labs Lab 04/06/16 1205  04/08/16 0312  NA 128*  < > 131*  K 3.5  < > 3.5  CL 91*  < > 98*  CO2 26  < > 26  GLUCOSE 264*  < > 131*  BUN 50*  < > 50*  CREATININE 1.72*  < > 1.75*  CALCIUM 8.7*  < > 8.4*  MG  --   < > 2.3  AST 53*  --   --   ALT 31  --   --   ALKPHOS 177*  --   --   BILITOT 5.9*  --   --   < > = values in this interval not displayed. Cardiac Enzymes  Recent Labs Lab 04/07/16 0455  TROPONINI 0.06*   RADIOLOGY:  No results found. ASSESSMENT AND PLAN:  1. Acute respiratory distress: -Transferred to step-down overnight 2/2 hypotension and hypothermia  -On 3.5 L via nasal cannula, wean as able -lasix on hold -on dopamine gtt  2. Acute on chronic combined CHF -UOP of 1.2L overnight -Diuresis on hold at this time 2/2 hypotension requiring dopamine infusion -Restart IV or oral diuresis when able -Na has improved to 133 this morning -BB and Entresto on hold -now on  po amiodarone due to freq ectopy  3. Sepsis 2/2 RLL PNA/hypotension/hypothermia: -Continue pressor support as above -cont zosyn and zithromax  4. Elevated troponin/CAD: -No symptoms of angina -Troponin mildly elevated and flat trending likely in the setting of supply demand ischemia   5. CKD stage III: -Improved this morning with diuresis -Watch for bump in renal function over the next couple of days given hypotension with SBP in the 60s overnight  6. Hypertensive heart disease: -BP soft as above -Antihypertensives on hold   Case  discussed with Care Management/Social Worker. Management plans discussed with the patient, family and they are in agreement.  CODE STATUS: full  DVT Prophylaxis: lovenox  TOTAL critical TIME TAKING CARE OF THIS PATIENT: 35 minutes.  >50% time spent on counselling and coordination of care pt and family  POSSIBLE D/C IN 2-3 DAYS, DEPENDING ON CLINICAL CONDITION.  Note: This dictation was prepared with Dragon dictation along with smaller phrase technology. Any transcriptional errors that result from this process are unintentional.  Ninetta Adelstein M.D on 04/08/2016 at 2:10 PM  Between 7am to 6pm - Pager - [phone]  After 6pm go to [email or web address] - password [email or web address] [facility]  [facility]  Office  [phone]  CC: Primary care physician; Toya SmothersLANCE TEGEN, PA

## 2016-04-08 NOTE — Progress Notes (Signed)
Patient up to chair and bathed with assist from staff and family.

## 2016-04-08 NOTE — Progress Notes (Signed)
Pharmacy Antibiotic Note  [name] is a 65 y.o. male admitted on 04/06/2016 with pneumonia.  Pharmacy has been consulted for Zosyn dosing. Patient is also on azithromycin.   This is day #3 of antibiotics.  Plan: Zosyn 3.375g IV q8h (4 hour infusion).  Height: 5\' 6"  (167.6 cm) Weight: 164 lb 0.4 oz (74.4 kg) IBW/kg (Calculated) : 63.8  Temp (24hrs), Avg:97.8 F (36.6 C), Min:97.5 F (36.4 C), Max:98.1 F (36.7 C)   Recent Labs Lab 04/06/16 1205 04/07/16 0455 04/08/16 0312  WBC 6.4 6.2 7.8  CREATININE 1.72* 1.41* 1.75*    Estimated Creatinine Clearance: 37 mL/min (by C-G formula based on SCr of 1.75 mg/dL).    No Known Allergies  Antimicrobials this admission: Ceftriaxone 9/5 >> x 2 azithromycin 9/6 >>  Zosyn 9/6 >>  Dose adjustments this admission:  Microbiology results: 9/6 BCx: NGTD x 2 9/6 MRSA PCR: negative  Thank you for allowing pharmacy to be a part of this patient's care.  Cindi CarbonMary M Nastasia Kage, PharmD 04/08/2016 1:47 PM

## 2016-04-08 NOTE — Progress Notes (Signed)
Patient: Kyle Morton / Admit Date: 04/06/2016 / Date of Encounter: 04/08/2016, 9:39 AM   Subjective: No acute overnight events. UOP of 1.5 L overnight (no documented input). Renal function bumped this morning to 1.75. Slowly gaining strength , still feels weak. Much less ectopy on IV amiodarone.    Review of Systems: Review of Systems  Constitutional: Positive for malaise/fatigue. Negative for chills, diaphoresis, fever and weight loss.  HENT: Negative for congestion.   Eyes: Negative for discharge and redness.  Respiratory: Positive for cough and shortness of breath. Negative for sputum production and wheezing.   Cardiovascular: Positive for leg swelling. Negative for chest pain, palpitations, orthopnea, claudication and PND.  Gastrointestinal: Negative for abdominal pain, heartburn, nausea and vomiting.  Musculoskeletal: Negative for falls and myalgias.  Skin: Negative for rash.  Neurological: Positive for weakness. Negative for dizziness, tingling, tremors, sensory change, speech change, focal weakness and loss of consciousness.  Endo/Heme/Allergies: Does not bruise/bleed easily.  Psychiatric/Behavioral: Negative for substance abuse. The patient is not nervous/anxious.   All other systems reviewed and are negative.   Objective: Telemetry: NSR, 70's, much less ectopy Physical Exam: Blood pressure 100/82, pulse 77, temperature 98.1 F (36.7 C), temperature source Axillary, resp. rate 17, height 5\' 6"  (1.676 m), weight 164 lb 0.4 oz (74.4 kg), SpO2 99 %. Body mass index is 26.47 kg/m. General: Well developed, well nourished, in no acute distress. Head: Normocephalic, atraumatic, sclera non-icteric, no xanthomas, nares are without discharge. Neck: Negative for carotid bruits. JVP not elevated. Lungs: Decreased breath sounds bilaterally with crackles. Breathing is unlabored. Heart: RRR S1 S2 without murmurs, rubs, or gallops.  Abdomen: Soft, non-tender, non-distended with  normoactive bowel sounds. No rebound/guarding. Extremities: No clubbing or cyanosis. 2+ pitting edema. Distal pedal pulses are 2+ and equal bilaterally. Neuro: Alert and oriented X 3. Moves all extremities spontaneously. Psych:  Responds to questions appropriately with a normal affect.   Intake/Output Summary (Last 24 hours) at 04/08/16 0939 Last data filed at 04/08/16 0900  Gross per 24 hour  Intake           808.61 ml  Output             1000 ml  Net          -191.39 ml    Inpatient Medications:  . aspirin EC  81 mg Oral Daily  . atorvastatin  80 mg Oral Daily  . azithromycin  250 mg Oral q1800  . cholecalciferol  2,000 Units Oral Daily  . clopidogrel  75 mg Oral Daily  . heparin subcutaneous  5,000 Units Subcutaneous Q8H  . insulin aspart  0-5 Units Subcutaneous QHS  . insulin aspart  0-9 Units Subcutaneous TID WC  . insulin aspart  10 Units Subcutaneous TID AC  . insulin glargine  25 Units Subcutaneous QHS  . piperacillin-tazobactam (ZOSYN)  IV  3.375 g Intravenous Q8H  . trolamine salicylate   Topical BID  . venlafaxine XR  150 mg Oral Q breakfast   Infusions:  . amiodarone 30 mg/hr (04/08/16 0900)  . DOPamine 7.179 mcg/kg/min (04/08/16 0907)    Labs:  Recent Labs  04/06/16 1721 04/07/16 0455 04/08/16 0312  NA  --  133* 131*  K  --  3.5 3.5  CL  --  99* 98*  CO2  --  25 26  GLUCOSE  --  166* 131*  BUN  --  44* 50*  CREATININE  --  1.41* 1.75*  CALCIUM  --  7.9*  8.4*  MG 2.1  --  2.3  PHOS  --   --  4.4    Recent Labs  04/06/16 1205  AST 53*  ALT 31  ALKPHOS 177*  BILITOT 5.9*  PROT 7.8  ALBUMIN 2.8*    Recent Labs  04/07/16 0455 04/08/16 0312  WBC 6.2 7.8  HGB 12.5* 13.7  HCT 36.4* 40.4  MCV 101.7* 101.2*  PLT 215 253    Recent Labs  04/06/16 1205 04/06/16 1721 04/06/16 2317 04/07/16 0455  TROPONINI 0.07* 0.08* 0.09* 0.06*   Invalid input(s): POCBNP No results for input(s): HGBA1C in the last 72 hours.   Weights: Filed  Weights   04/06/16 1149 04/06/16 1723 04/07/16 0132  Weight: 158 lb (71.7 kg) 160 lb 6.4 oz (72.8 kg) 164 lb 0.4 oz (74.4 kg)     Radiology/Studies:  Dg Chest 2 View  Result Date: 04/06/2016 CLINICAL DATA:  Shortness of Breath EXAM: CHEST  2 VIEW COMPARISON:  January 28, 2015 FINDINGS: There is airspace consolidation in the right lower lobe. Lungs elsewhere are clear. There is mild cardiomegaly with pulmonary vascularity within normal limits. There is atherosclerotic calcification in the aorta. No adenopathy. No bone lesions. IMPRESSION: Airspace consolidation in the right lower lobe consistent with pneumonia. Lungs elsewhere clear. Mild cardiomegaly present. There is aortic atherosclerosis. Followup PA and lateral chest radiographs recommended in 3-4 weeks following trial of antibiotic therapy to ensure resolution and exclude underlying malignancy. Electronically Signed   By: Bretta BangWilliam  Woodruff III M.D.   On: 04/06/2016 12:28     Assessment and Plan  Principal Problem:   Acute respiratory distress (HCC) Active Problems:   Shortness of breath   Acute on chronic combined systolic (congestive) and diastolic (congestive) heart failure (HCC)   Hypertensive heart and kidney disease   Cardiomyopathy, ischemic   CAD S/P percutaneous coronary angioplasty   Malnutrition (HCC)   Hyponatremia   CKD (chronic kidney disease), stage III   Hyperlipidemia   DM2 (diabetes mellitus, type 2) (HCC)   Arterial hypotension    1. Acute on chronic combined systolic and diastolic CHF: -IV diuresis has been on hold for 24 hours given episode of hypotension overnight nto 9/6 -Documented UOP of 1.5 L (no input documented) -Markedly elevated right-heart pressure on echo -Needs several more day sof diuresis though this is on hold given hypotension and ARI -Continue to monitor output today with possible resumption of IV Lasix on 9/8 if renal function improves -For now, continue to self diurese (has to void  now) -Continue dopamine for now  2. Ectopy: -Much improved on IV amiodarone -Will transition to PO amiodarone 400 mg bid for a couple of days then taper down -Monitor telemetry  -Possibly in the setting of his infection  3. Sepsis in the setting of RLL PNA: -Per PCCM  4. Acute on CKD stage III: -Possibly ATN from hypotension, though cannot rule out cardiorenal syndrome at this time -Continue to monitor now that his BP has improved -Will hold off on starting low-dose IV Lasix at this time until we see his UOP today as well as how his renal function is trending  5. CAD/mildly elevated troponin: -No symptoms concerning for angina -Troponin elevation likely supply demand ischemia in the setting of #1 and #3  Signed, Carola FrostRyan Ranay Ketter, PA-C [facility] Pager: [phone] 04/08/2016, 9:39 AM

## 2016-04-08 NOTE — Progress Notes (Addendum)
[facility] Critical Care Medicine Progess Note    ASSESSMENT/PLAN   DISCUSSION: 65 yo male with acute on chronic CHF/cardiomopathy; Continues on Dopamine peripherally per cardiology for renal function.   ASSESSMENT / PLAN:  CARDIOVASCULAR A:   Cardiogenic shock Chronic systolic and diastolic heart failure with EF of 20-25% ?Septic shock.  Coronary Artery Disease Hx of Hyperlipidemia Elevated Troponin  P:  Dopamine gtt, Currently at 8 mics; wean as tolerated Keep SBP greater than 90. Continue aspirin Hold imdur / Coreg/Spironolactone/entresto Follow cardiology recommendation  PULMONARY A: Right lower lobe PNA Acute respiratory Distress. P:   Support with Oxygen to keep sats>94% Continue Ceftriaxone/Azithromycin Follow sputum culture  RENAL A:   Chronic Kidney Disease- Stage 3 hyponatremia P:   Hold home med allopurinol and colchicine Monitor renal function  Monitor Serial Sodiums Follow BMET    GASTROINTESTINAL A:   Hx  Of GERD P:   HH diet  HEMATOLOGIC A:   No active issues P:  Heparin for DVT prophylaxis Transfuse if Hgb<7  INFECTIOUS A:   Community Acquired PNA hypothermia P:   Continue ceftriaxone/azitromycin Follow  culture Monitor fever curve Follow CBC  CULTURES: none  ANTIBIOTICS: 9/5 ceftriaxone>> 9/6>>zosyn   ENDOCRINE A:   Hx of DM P:   Blood sugar checks ACHS SSI Coverage Novlog 10 units tid before meals Lantus 25 units at bedtime  NEUROLOGIC A:   Hx of Depression P:   Continue Venlafaxine- XR  STUDIES:  07/29/15 ECHO>>EF is still very low at 20 to 25%, unchanged from before  Noted by Dr. Mariah MillingGollan  04-06-16>> EF remains 20-25% with severe diffuse LV hypokinesis and possible akinesis in the inferior/posterior wall   FAMILY  - Updates: Daughter was updated regarding patient's status.  MAJOR EVENTS/TEST RESULTS:   Best Practices  DVT Prophylaxis: heparin SQ GI Prophylaxis:  --  SIGNIFICANT EVENTS: 9/5 Patient admitted [facility] ICU with hypotension and hypothermia  LINES/TUBES: None ---------------------------------------   ----------------------------------------   Name: Kyle Morton MRN: [id number] DOB: 05/19/1951    ADMISSION DATE:  04/06/2016    SUBJECTIVE:   No new complaints today.   Review of Systems:  Constitutional: Feels well. Cardiovascular: No chest pain.  Pulmonary: Denies dyspnea.   The remainder of systems were reviewed and were found to be negative other than what is documented in the HPI.    VITAL SIGNS: Temp:  [97.5 F (36.4 C)-98.1 F (36.7 C)] 98.1 F (36.7 C) (09/07 0800) Pulse Rate:  [60-88] 77 (09/07 0800) Resp:  [0-23] 17 (09/07 0800) BP: (83-113)/(66-89) 100/82 (09/07 0800) SpO2:  [95 %-100 %] 99 % (09/07 0800) HEMODYNAMICS:   VENTILATOR SETTINGS:   INTAKE / OUTPUT:  Intake/Output Summary (Last 24 hours) at 04/08/16 0850 Last data filed at 04/08/16 0835  Gross per 24 hour  Intake           789.21 ml  Output             1000 ml  Net          -210.79 ml    PHYSICAL EXAMINATION: Physical Examination:   VS: BP 100/82 (BP Location: Left Arm)   Pulse 77   Temp 98.1 F (36.7 C) (Axillary)   Resp 17   Ht 5\' 6"  (1.676 m)   Wt 164 lb 0.4 oz (74.4 kg)   SpO2 99%   BMI 26.47 kg/m   General Appearance: No distress  Neuro:without focal findings, mental status normal. HEENT: PERRLA, EOM intact. Pulmonary: normal  breath sounds   CardiovascularNormal S1,S2.  No m/r/g.   Abdomen: Benign, Soft, non-tender. Renal:  No costovertebral tenderness  GU:  Not performed at this time. Endocrine: No evident thyromegaly. Skin:   warm, no rashes, no ecchymosis  Extremities: normal, no cyanosis, clubbing.   LABS:   LABORATORY PANEL:   CBC  Recent Labs Lab 04/08/16 0312  WBC 7.8  HGB 13.7  HCT 40.4  PLT 253    Chemistries   Recent Labs Lab 04/06/16 1205  04/08/16 0312   NA 128*  < > 131*  K 3.5  < > 3.5  CL 91*  < > 98*  CO2 26  < > 26  GLUCOSE 264*  < > 131*  BUN 50*  < > 50*  CREATININE 1.72*  < > 1.75*  CALCIUM 8.7*  < > 8.4*  MG  --   < > 2.3  PHOS  --   --  4.4  AST 53*  --   --   ALT 31  --   --   ALKPHOS 177*  --   --   BILITOT 5.9*  --   --   < > = values in this interval not displayed.   Recent Labs Lab 04/07/16 1156 04/07/16 1627 04/07/16 2127 04/07/16 2231 04/07/16 2356 04/08/16 0753  GLUCAP 197* 105* 97 75 102* 222*   No results for input(s): PHART, PCO2ART, PO2ART in the last 168 hours.  Recent Labs Lab 04/06/16 1205  AST 53*  ALT 31  ALKPHOS 177*  BILITOT 5.9*  ALBUMIN 2.8*    Cardiac Enzymes  Recent Labs Lab 04/07/16 0455  TROPONINI 0.06*    RADIOLOGY:  Dg Chest 2 View  Result Date: 04/06/2016 CLINICAL DATA:  Shortness of Breath EXAM: CHEST  2 VIEW COMPARISON:  January 28, 2015 FINDINGS: There is airspace consolidation in the right lower lobe. Lungs elsewhere are clear. There is mild cardiomegaly with pulmonary vascularity within normal limits. There is atherosclerotic calcification in the aorta. No adenopathy. No bone lesions. IMPRESSION: Airspace consolidation in the right lower lobe consistent with pneumonia. Lungs elsewhere clear. Mild cardiomegaly present. There is aortic atherosclerosis. Followup PA and lateral chest radiographs recommended in 3-4 weeks following trial of antibiotic therapy to ensure resolution and exclude underlying malignancy. Electronically Signed   By: Bretta Bang III M.D.   On: 04/06/2016 12:28       --Wells Guiles, MD.  ICU Pager: [phone] [facility] Office Number: [phone]  Santiago Glad, M.D.  Stephanie Acre, M.D.  Billy Fischer, M.D  04/08/2016   Critical Care Attestation.  I have personally obtained a history, examined the patient, evaluated laboratory and imaging results, formulated the assessment and plan and placed orders. The  Patient requires high complexity decision making for assessment and support, frequent evaluation and titration of therapies, application of advanced monitoring technologies and extensive interpretation of multiple databases. The patient has critical illness that could lead imminently to failure of 1 or more organ systems and requires the highest level of physician preparedness to intervene.  Critical Care Time devoted to patient care services described in this note is 35 minutes and is exclusive of time spent in procedures.

## 2016-04-08 NOTE — Progress Notes (Signed)
PULMONARY / CRITICAL CARE MEDICINE   Name: Kyle Morton MRN: [id number] DOB: 08/27/1950    ADMISSION DATE:  04/06/2016 CONSULTATION DATE:  04/07/16 REFERRING MD:  Dr. Lenord FellersHuglemeyer  CHIEF COMPLAINT: Hypotension   DISCUSSION: 65 yo male transferred from the floor on 04/07/16 wih chronic systolic and diastolic heart failure with severely reduced EF due to hypotension and hypothermia, not responding to fluids.  Started on Dopamine peripherally. If requires>8810mcg/kg/min, will need CVL  SUBJECTIVE:  Patient remains on low dose of Dopamine with MAP of 65-70. Was afebrile, had an uneventful night .  VITAL SIGNS: BP 98/84   Pulse 82   Temp 97.8 F (36.6 C) (Oral)   Resp 20   Ht 5\' 6"  (1.676 m)   Wt 164 lb 0.4 oz (74.4 kg)   SpO2 98%   BMI 26.47 kg/m   HEMODYNAMICS:    VENTILATOR SETTINGS:    INTAKE / OUTPUT: I/O last 3 completed shifts: In: 810.4 [P.O.:480; I.V.:180.4; IV Piggyback:150] Out: 2150 [Urine:2150]  PHYSICAL EXAMINATION: General:  AA male, in no acute distress Neuro:  Awake, alert, oriented, follows command, no focal deficits HEENT:  Atraumatic, normocephalic,no JVD appreciated Cardiovascular:  S1S2, RRR, No MRG noted Lungs: diminished right lower base but otherwise clear, no wheezes, crackles, rhonchi noted Abdomen:  Soft, non tender, active bowel sounds Musculoskeletal:  No inflammation/deformity noted Skin:  Grossly intact   LABS:  BMET  Recent Labs Lab 04/06/16 1205 04/07/16 0455  NA 128* 133*  K 3.5 3.5  CL 91* 99*  CO2 26 25  BUN 50* 44*  CREATININE 1.72* 1.41*  GLUCOSE 264* 166*    Electrolytes  Recent Labs Lab 04/06/16 1205 04/06/16 1721 04/07/16 0455  CALCIUM 8.7*  --  7.9*  MG  --  2.1  --     CBC  Recent Labs Lab 04/06/16 1205 04/07/16 0455  WBC 6.4 6.2  HGB 13.2 12.5*  HCT 39.3* 36.4*  PLT 248 215    Coag's No results for input(s): APTT, INR in the last 168 hours.  Sepsis Markers No results for input(s):  LATICACIDVEN, PROCALCITON, O2SATVEN in the last 168 hours.  ABG No results for input(s): PHART, PCO2ART, PO2ART in the last 168 hours.  Liver Enzymes  Recent Labs Lab 04/06/16 1205  AST 53*  ALT 31  ALKPHOS 177*  BILITOT 5.9*  ALBUMIN 2.8*    Cardiac Enzymes  Recent Labs Lab 04/06/16 1721 04/06/16 2317 04/07/16 0455  TROPONINI 0.08* 0.09* 0.06*    Glucose  Recent Labs Lab 04/07/16 0723 04/07/16 1156 04/07/16 1627 04/07/16 2127 04/07/16 2231 04/07/16 2356  GLUCAP 184* 197* 105* 97 75 102*    Imaging No results found.   STUDIES:  9/5 ECHO>Left ventricle: The cavity size was normal. Systolic function was  severely reduced. The estimated ejection fraction was less than  20% Severely reduced. Mitral valve: There was moderate regurgitation. Mildly dilated left atrium ,right ventricle and right atrium.  CULTURES: none  ANTIBIOTICS: 9/5 ceftriaxone>>9/6 9/5 azithromycin>> 9/6 Zosyn>> SIGNIFICANT EVENTS: 9/5 Patient admitted to [facility] ICU with hypotension and hypothermia  LINES/TUBES: None CARDIOVASCULAR  A Chronic systolic and diastolic heart failure with EF of < 20% Coronary Artery Disease Hx of Hyperlipidemia  P:  Continuous telemetry Dopamine gtt Keep MAP goals>65 Continue aspirin Hold diuresis for now Hold imdur / Coreg/Spironolactone/entresto Continue lipitor Strict I/o Daily weight ECHO 9/5 (EF of <20%)  Last catheterization 1 year ago, no intervention at that time.  Follow cardiology recommendation  PULMONARY A:  Right lower lobe PNA Acute respiratory Distress. P:   Support with Oxygen to keep sats>94% Continue zosyn/Azithromycin Follow sputum culture  RENAL A:   Chronic Kidney Disease- Stage 3 hyponatremia P:   Hold home med allopurinol and colchicine Monitor renal function  Monitor Serial Sodiums Follow BMET    GASTROINTESTINAL A:   Hx  Of GERD P:   HH diet  HEMATOLOGIC A:   No  active issues P:  Heparin for DVT prophylaxis Transfuse if Hgb<7  INFECTIOUS A:    PNA hypothermia P:   Continue zosyn/azithromycin Follow  culture Monitor fever curve Follow CBC  ENDOCRINE A:   Hx of DM P:   Blood sugar checks ACHS SSI Coverage Novlog 10 units tid before meals Lantus 25 units at bedtime  NEUROLOGIC A:   Hx of Depression P:   Continue Venlafaxine- XR   Hadriel Northup,AG-ACNP Pulmonary and Critical Care Medicine [facility]   04/08/2016, 3:06 AM

## 2016-04-09 ENCOUNTER — Ambulatory Visit: Payer: Medicare Other | Admitting: Physician Assistant

## 2016-04-09 DIAGNOSIS — E785 Hyperlipidemia, unspecified: Secondary | ICD-10-CM

## 2016-04-09 LAB — BASIC METABOLIC PANEL
Anion gap: 8 (ref 5–15)
BUN: 42 mg/dL — AB (ref 6–20)
CHLORIDE: 97 mmol/L — AB (ref 101–111)
CO2: 25 mmol/L (ref 22–32)
CREATININE: 1.51 mg/dL — AB (ref 0.61–1.24)
Calcium: 8.2 mg/dL — ABNORMAL LOW (ref 8.9–10.3)
GFR calc Af Amer: 53 mL/min — ABNORMAL LOW (ref >60)
GFR calc non Af Amer: 46 mL/min — ABNORMAL LOW (ref >60)
Glucose, Bld: 109 mg/dL — ABNORMAL HIGH (ref 65–99)
Potassium: 3.1 mmol/L — ABNORMAL LOW (ref 3.5–5.1)
Sodium: 130 mmol/L — ABNORMAL LOW (ref 135–145)

## 2016-04-09 LAB — CBC
HCT: 41.6 % (ref 40.0–52.0)
Hemoglobin: 14 g/dL (ref 13.0–18.0)
MCH: 34.5 pg — AB (ref 26.0–34.0)
MCHC: 33.8 g/dL (ref 32.0–36.0)
MCV: 102 fL — AB (ref 80.0–100.0)
PLATELETS: 235 10*3/uL (ref 150–440)
RBC: 4.07 MIL/uL — AB (ref 4.40–5.90)
RDW: 15.7 % — AB (ref 11.5–14.5)
WBC: 7.4 10*3/uL (ref 3.8–10.6)

## 2016-04-09 LAB — GLUCOSE, CAPILLARY
GLUCOSE-CAPILLARY: 95 mg/dL (ref 65–99)
Glucose-Capillary: 67 mg/dL (ref 65–99)
Glucose-Capillary: 177 mg/dL — ABNORMAL HIGH (ref 65–99)
Glucose-Capillary: 149 mg/dL — ABNORMAL HIGH (ref 65–99)

## 2016-04-09 LAB — CORTISOL-AM, BLOOD: Cortisol - AM: 21.2 ug/dL (ref 6.7–22.6)

## 2016-04-09 MED ORDER — POTASSIUM CHLORIDE 10 MEQ/100ML IV SOLN
10.0000 meq | INTRAVENOUS | Status: AC
  Administered 2016-04-09 (×4): 10 meq via INTRAVENOUS
  Filled 2016-04-09 (×4): qty 100

## 2016-04-09 MED ORDER — FLEET ENEMA 7-19 GM/118ML RE ENEM: 1.0000 | ENEMA | Freq: Every day | RECTAL | Status: DC | PRN

## 2016-04-09 MED ORDER — DEXTROSE 50 % IV SOLN
25.0000 mL | Freq: Once | INTRAVENOUS | Status: AC
  Administered 2016-04-11: 25 mL via INTRAVENOUS
  Filled 2016-04-09: qty 50

## 2016-04-09 MED ORDER — ENSURE ENLIVE PO LIQD
237.0000 mL | Freq: Two times a day (BID) | ORAL | Status: DC
  Administered 2016-04-09 – 2016-04-12 (×6): 237 mL via ORAL

## 2016-04-09 MED ORDER — AMOXICILLIN-POT CLAVULANATE 875-125 MG PO TABS
1.0000 | ORAL_TABLET | Freq: Two times a day (BID) | ORAL | Status: AC
  Administered 2016-04-09 – 2016-04-12 (×7): 1 via ORAL
  Filled 2016-04-09 (×7): qty 1

## 2016-04-09 MED ORDER — BISACODYL 10 MG RE SUPP
10.0000 mg | Freq: Every day | RECTAL | Status: DC | PRN
  Administered 2016-04-09: 10 mg via RECTAL
  Filled 2016-04-09: qty 1

## 2016-04-09 MED ORDER — INSULIN ASPART 100 UNIT/ML ~~LOC~~ SOLN
5.0000 [IU] | Freq: Three times a day (TID) | SUBCUTANEOUS | Status: DC
Start: 2016-04-10 — End: 2016-04-12
  Administered 2016-04-10 – 2016-04-12 (×7): 5 [IU] via SUBCUTANEOUS
  Filled 2016-04-09 (×5): qty 5

## 2016-04-09 MED ORDER — INSULIN GLARGINE 100 UNIT/ML ~~LOC~~ SOLN
16.0000 [IU] | Freq: Every day | SUBCUTANEOUS | Status: DC
  Administered 2016-04-09 – 2016-04-11 (×2): 16 [IU] via SUBCUTANEOUS
  Filled 2016-04-09 (×4): qty 0.16

## 2016-04-09 NOTE — Progress Notes (Signed)
Pt's CBG this morning noted to be 67 mg/dL. Pt received breakfast tray. Dr. Allena KatzPatel stated to hold off on implementing the hypoglycemia protocol as long as pt remains asymptomatic and eats the food on his tray. Will re-evaluate CBG after breakfast.

## 2016-04-09 NOTE — Progress Notes (Signed)
Patient: Kyle Morton / Admit Date: 04/06/2016 / Date of Encounter: 04/09/2016, 8:00 AM   Subjective: Rough nite overnight complaining of increased constipation and hypoglycemia. Net UOP of 165 mL for the past 24. Total UOP for the admission to date 1.3 L. Less SOB this morning. BP remains in the low 100's systolic with an episode of hypotension overnight with SBP in the 80's mm Hg. Remains on dopamine 5 mcg/min. Renal function improved today to 1.51 from 1.75 on 9/7. Potassium remains low, repletion ordered this morning.   Review of Systems: Review of Systems  Constitutional: Positive for malaise/fatigue. Negative for chills, diaphoresis, fever and weight loss.  HENT: Negative for congestion.   Eyes: Negative for discharge and redness.  Respiratory: Positive for cough and shortness of breath. Negative for sputum production and wheezing.   Cardiovascular: Positive for leg swelling. Negative for chest pain, palpitations, orthopnea, claudication and PND.  Gastrointestinal: Positive for constipation. Negative for abdominal pain, heartburn, nausea and vomiting.  Musculoskeletal: Negative for falls and myalgias.  Skin: Negative for rash.  Neurological: Positive for weakness. Negative for dizziness, tingling, tremors, sensory change, speech change, focal weakness and loss of consciousness.  Endo/Heme/Allergies: Does not bruise/bleed easily.  Psychiatric/Behavioral: Negative for substance abuse. The patient is not nervous/anxious.   All other systems reviewed and are negative.   Objective: Telemetry: NSR, 70's increased PACs and PVCs (PVCs appearing to come from different foci as they are not all the same morphology) Physical Exam: Blood pressure (!) 105/92, pulse 67, temperature 97.6 F (36.4 C), temperature source Oral, resp. rate 16, height 5\' 6"  (1.676 m), weight 173 lb 4.5 oz (78.6 kg), SpO2 99 %. Body mass index is 27.97 kg/m. General: Well developed, well nourished, in no acute  distress. Head: Normocephalic, atraumatic, sclera non-icteric, no xanthomas, nares are without discharge. Neck: Negative for carotid bruits. JVP elevated. Lungs: Decreased breath sounds bilaterally with rales. Breathing is unlabored. Heart: RRR S1 S2 without murmurs, rubs, or gallops.  Abdomen: Soft, non-tender, non-distended with normoactive bowel sounds. No rebound/guarding. Extremities: No clubbing or cyanosis. 1+ pitting LE edema. Distal pedal pulses are 2+ and equal bilaterally. Neuro: Alert and oriented X 3. Moves all extremities spontaneously. Psych:  Responds to questions appropriately with a normal affect.   Intake/Output Summary (Last 24 hours) at 04/09/16 0800 Last data filed at 04/09/16 0600  Gross per 24 hour  Intake          1191.78 ml  Output             1095 ml  Net            96.78 ml    Inpatient Medications:  . amiodarone  400 mg Oral BID  . aspirin EC  81 mg Oral Daily  . atorvastatin  80 mg Oral Daily  . azithromycin  250 mg Oral q1800  . cholecalciferol  2,000 Units Oral Daily  . clopidogrel  75 mg Oral Daily  . docusate sodium  100 mg Oral BID  . heparin subcutaneous  5,000 Units Subcutaneous Q8H  . insulin aspart  0-5 Units Subcutaneous QHS  . insulin aspart  0-9 Units Subcutaneous TID WC  . insulin aspart  10 Units Subcutaneous TID AC  . insulin glargine  25 Units Subcutaneous QHS  . piperacillin-tazobactam (ZOSYN)  IV  3.375 g Intravenous Q8H  . potassium chloride  10 mEq Intravenous Q1 Hr x 4  . trolamine salicylate   Topical BID  . venlafaxine XR  150  mg Oral Q breakfast   Infusions:  . DOPamine 5 mcg/kg/min (04/09/16 0445)    Labs:  Recent Labs  04/06/16 1721  04/08/16 0312 04/09/16 0435  NA  --   < > 131* 130*  K  --   < > 3.5 3.1*  CL  --   < > 98* 97*  CO2  --   < > 26 25  GLUCOSE  --   < > 131* 109*  BUN  --   < > 50* 42*  CREATININE  --   < > 1.75* 1.51*  CALCIUM  --   < > 8.4* 8.2*  MG 2.1  --  2.3  --   PHOS  --   --  4.4   --   < > = values in this interval not displayed.  Recent Labs  04/06/16 1205  AST 53*  ALT 31  ALKPHOS 177*  BILITOT 5.9*  PROT 7.8  ALBUMIN 2.8*    Recent Labs  04/08/16 0312 04/09/16 0435  WBC 7.8 7.4  HGB 13.7 14.0  HCT 40.4 41.6  MCV 101.2* 102.0*  PLT 253 235    Recent Labs  04/06/16 1205 04/06/16 1721 04/06/16 2317 04/07/16 0455  TROPONINI 0.07* 0.08* 0.09* 0.06*   Invalid input(s): POCBNP No results for input(s): HGBA1C in the last 72 hours.   Weights: Filed Weights   04/06/16 1723 04/07/16 0132 04/09/16 0700  Weight: 160 lb 6.4 oz (72.8 kg) 164 lb 0.4 oz (74.4 kg) 173 lb 4.5 oz (78.6 kg)     Radiology/Studies:  Dg Chest 2 View  Result Date: 04/06/2016 CLINICAL DATA:  Shortness of Breath EXAM: CHEST  2 VIEW COMPARISON:  January 28, 2015 FINDINGS: There is airspace consolidation in the right lower lobe. Lungs elsewhere are clear. There is mild cardiomegaly with pulmonary vascularity within normal limits. There is atherosclerotic calcification in the aorta. No adenopathy. No bone lesions. IMPRESSION: Airspace consolidation in the right lower lobe consistent with pneumonia. Lungs elsewhere clear. Mild cardiomegaly present. There is aortic atherosclerosis. Followup PA and lateral chest radiographs recommended in 3-4 weeks following trial of antibiotic therapy to ensure resolution and exclude underlying malignancy. Electronically Signed   By: Bretta BangWilliam  Woodruff III M.D.   On: 04/06/2016 12:28     Assessment and Plan  Principal Problem:   Acute respiratory distress (HCC) Active Problems:   Shortness of breath   Acute on chronic combined systolic (congestive) and diastolic (congestive) heart failure (HCC)   Hypertensive heart and kidney disease   Cardiomyopathy, ischemic   CAD S/P percutaneous coronary angioplasty   Malnutrition (HCC)   Hyponatremia   CKD (chronic kidney disease), stage III   Hyperlipidemia   DM2 (diabetes mellitus, type 2) (HCC)   Arterial  hypotension    1. Acute on chronic combined systolic and diastolic CHF: -IV diuresis has been on hold for 48 hours given episode of hypotension overnight into 9/6 -Documented UOP of 1.3 L  -Markedly elevated right-heart pressure on echo -Needs several more days of diuresis though this is on hold given hypotension and ARI -Will d/w MD regarding initiation of low-dose IV Lasix this morning  -For now, continue to self diurese  -Continue dopamine at 5 mcg/min for BP and cardiac contractility   2. Ectopy: -Appears to be an increase in ectopy this AM with PVCs coming from different foci -Given his cardiomyopathy he would benefit from less ectopy -Consider going back on IV amiodarone, will discuss with MD IV vs PO amio -  For now, continue PO amiodarone at 400 mg bid -Monitor telemetry  -Possibly in the setting of his infection  3. Sepsis in the setting of RLL PNA: -Per PCCM  4. Acute on CKD stage III: -Improving -Possibly ATN from hypotension, though cannot rule out cardiorenal syndrome at this time -Continue to monitor now that his BP has improved -As above  5. CAD/mildly elevated troponin: -No symptoms concerning for angina -Troponin elevation likely supply demand ischemia in the setting of #1 and #3  6. Possible sleep apnea: -Apneic episodes noted on screen in room -Needs follow up  Signed, Eula Listen, PA-C [facility] Pager: [phone] 04/09/2016, 8:00 AM

## 2016-04-09 NOTE — Progress Notes (Signed)
Pharmacy Antibiotic Note  Kyle Morton is a 65 y.o. male admitted on 04/06/2016 with pneumonia.  Pharmacy has been consulted for Zosyn dosing. Patient is also on azithromycin.   This is day #4 of antibiotics.  Plan: Zosyn 3.375g IV q8h (4 hour infusion).  Height: 5\' 6"  (167.6 cm) Weight: 173 lb 4.5 oz (78.6 kg) IBW/kg (Calculated) : 63.8  Temp (24hrs), Avg:97.6 F (36.4 C), Min:97.4 F (36.3 C), Max:97.8 F (36.6 C)   Recent Labs Lab 04/06/16 1205 04/07/16 0455 04/08/16 0312 04/09/16 0435  WBC 6.4 6.2 7.8 7.4  CREATININE 1.72* 1.41* 1.75* 1.51*    Estimated Creatinine Clearance: 46.8 mL/min (by C-G formula based on SCr of 1.51 mg/dL).    No Known Allergies  Antimicrobials this admission: Ceftriaxone 9/5 >> x 2 azithromycin 9/6 >>  Zosyn 9/6 >>  Dose adjustments this admission:  Microbiology results: 9/6 BCx: NGTD x 2 9/6 MRSA PCR: negative  Thank you for allowing pharmacy to be a part of this patient's care.  Kyle Morton, PharmD 04/09/2016 2:45 PM

## 2016-04-09 NOTE — Progress Notes (Signed)
[facility] - [place] [facility]   PATIENT NAME: Kyle Morton    MR#:  [id number]  DATE OF BIRTH:  Sep 18, 1950  SUBJECTIVE:  Feels weak, c/o constipation On dopamine drip UOP 1500cc Off Amiodarone gtt  REVIEW OF SYSTEMS:   Review of Systems  Constitutional: Negative for chills, fever and weight loss.  HENT: Negative for ear discharge, ear pain and nosebleeds.   Eyes: Negative for blurred vision, pain and discharge.  Respiratory: Negative for sputum production, wheezing and stridor.   Cardiovascular: Positive for leg swelling. Negative for chest pain, palpitations and PND.  Gastrointestinal: Positive for constipation. Negative for abdominal pain, diarrhea, nausea and vomiting.  Genitourinary: Negative for frequency and urgency.  Musculoskeletal: Negative for back pain and joint pain.  Neurological: Positive for weakness. Negative for sensory change, speech change and focal weakness.  Psychiatric/Behavioral: Negative for depression and hallucinations. The patient is not nervous/anxious.    Tolerating Diet:yes Tolerating PT: pending  DRUG ALLERGIES:  No Known Allergies  VITALS:  Blood pressure (!) 105/92, pulse 67, temperature 97.6 F (36.4 C), temperature source Oral, resp. rate 16, height 5\' 6"  (1.676 m), weight 78.6 kg (173 lb 4.5 oz), SpO2 99 %.  PHYSICAL EXAMINATION:   Physical Exam  GENERAL:  65 y.o.-year-old patient lying in the bed with no acute distress.  EYES: Pupils equal, round, reactive to light and accommodation. No scleral icterus. Extraocular muscles intact.  HEENT: Head atraumatic, normocephalic. Oropharynx and nasopharynx clear.  NECK:  Supple, no jugular venous distention. No thyroid enlargement, no tenderness.  LUNGS: Normal breath sounds bilaterally, no wheezing,bilateral rales,no rhonchi.  No use of accessory muscles of respiration.  CARDIOVASCULAR: S1, S2 normal. No murmurs, rubs, or gallops.  ABDOMEN: Soft, nontender,  nondistended. Bowel sounds present. No organomegaly or mass.  EXTREMITIES: No cyanosis, clubbing  +edema b/l.    NEUROLOGIC: Cranial nerves II through XII are intact. No focal Motor or sensory deficits b/l.   PSYCHIATRIC:  patient is alert and oriented x 3.  SKIN: No obvious rash, lesion, or ulcer.   LABORATORY PANEL:  CBC  Recent Labs Lab 04/09/16 0435  WBC 7.4  HGB 14.0  HCT 41.6  PLT 235    Chemistries   Recent Labs Lab 04/06/16 1205  04/08/16 0312 04/09/16 0435  NA 128*  < > 131* 130*  K 3.5  < > 3.5 3.1*  CL 91*  < > 98* 97*  CO2 26  < > 26 25  GLUCOSE 264*  < > 131* 109*  BUN 50*  < > 50* 42*  CREATININE 1.72*  < > 1.75* 1.51*  CALCIUM 8.7*  < > 8.4* 8.2*  MG  --   < > 2.3  --   AST 53*  --   --   --   ALT 31  --   --   --   ALKPHOS 177*  --   --   --   BILITOT 5.9*  --   --   --   < > = values in this interval not displayed. Cardiac Enzymes  Recent Labs Lab 04/07/16 0455  TROPONINI 0.06*   RADIOLOGY:  No results found. ASSESSMENT AND PLAN:  1. Acute respiratory distress: -Transferred to step-down overnight 2/2 hypotension and hypothermia  -On 3.5 L via nasal cannula, wean as able -lasix on hold -on dopamine gtt. Creatinine improving  2. Acute on chronic combined CHF -Diuresis on hold at this time 2/2 hypotension requiring dopamine infusion -Restart IV or  oral diuresis when able -Na has improved to 133 this morning -BB and Entresto on hold -now on po amiodarone due to freq ectopy  3. Sepsis 2/2 RLL PNA/hypotension -Continue pressor support as above -cont zosyn and zithromax---change to po augementin -wbc normal  4. Elevated troponin/CAD: -No symptoms of angina -Troponin mildly elevated and flat trending likely in the setting of supply demand ischemia   5. CKD stage III: -Improved this morning with diuresis -Watch for bump in renal function over the next couple of days given hypotension with SBP in the 60s overnight  6.  Hypertensive heart disease: -BP soft as above -Antihypertensives on hold  7. Constipation Dulcolax supp and or enema  Case discussed with Care Management/Social Worker. Management plans discussed with the patient, family and they are in agreement.  CODE STATUS: full  DVT Prophylaxis: lovenox  TOTAL cr TIME TAKING CARE OF THIS PATIENT: 30 minutes.  >50% time spent on counselling and coordination of care pt and family  POSSIBLE D/C IN 2-3 DAYS, DEPENDING ON CLINICAL CONDITION.  Note: This dictation was prepared with Dragon dictation along with smaller phrase technology. Any transcriptional errors that result from this process are unintentional.  Shenika Quint M.D on 04/09/2016 at 12:01 PM  Between 7am to 6pm - Pager - [phone]  After 6pm go to [email or web address] - password [email or web address]  [facility]  Office  [phone]  CC: Primary care physician; Toya SmothersLANCE TEGEN, PA

## 2016-04-09 NOTE — Progress Notes (Signed)
Inpatient Diabetes Program Recommendations  AACE/ADA: New Consensus Statement on Inpatient Glycemic Control (2015)  Target Ranges:  Prepandial:   less than 140 mg/dL      Peak postprandial:   less than 180 mg/dL (1-2 hours)      Critically ill patients:  140 - 180 mg/dL   Lab Results  Component Value Date   GLUCAP 95 04/09/2016   HGBA1C 6.7 (H) 01/07/2015    Review of Glycemic Control  Results for Kyle MilroyHUNTER, Antonie (MRN [id number]) as of 04/09/2016 14:57  Ref. Range 04/08/2016 10:59 04/08/2016 16:37 04/08/2016 22:12 04/09/2016 07:35 04/09/2016 11:38  Glucose-Capillary Latest Ref Range: 65 - 99 mg/dL 213224 (H) 93 086129 (H) 67 95     Diabetes history: Type 2 Outpatient Diabetes medications: Novolog 10 units tid with meals, Lantus 25 units qhs Current orders for Inpatient glycemic control: Novolog 10 units tid with meals, Lantus 25 units qhs, Novolog 0-9 units tid, Novolog 0-5 units qhs  Inpatient Diabetes Program Recommendations:  Low CBG through the day today- has not required Novolog mealtime or correction insulin- received Lantus 25 units last evening.  Please consider decreasing Lantus insulin to 16 units qhs (0.2units/kg) and decrease Novolog mealtime to 2 units tid (hold if patient eats less than 50%)  Continue Novolog correction as ordered.   Susette RacerJulie Faryal Marxen, RN, BA, MHA, CDE Diabetes Coordinator Inpatient Diabetes Program  [phone] (Team Pager) [phone] [facility] Office) 04/09/2016 3:02 PM

## 2016-04-09 NOTE — Progress Notes (Signed)
eLink Physician-Brief Progress Note Patient Name: Kyle MilroyWillie Morton DOB: 11/13/1950 MRN: [id number]   Date of Service  04/09/2016  HPI/Events of Note  Notified by bedside nurse of patient receiving Lantus 25 units daily at bedtime along with sliding scale insulin coverage and scheduled mealtime insulin. A.m. blood glucose 67. Serum creatinine 1.51 with relatively stable renal function upon review. Patient currently on liquid diet. Reviewed recommendations from diabetes coordinator.   eICU Interventions  1. Decrease Lantus to 16 units subcutaneous daily at bedtime 2. Decreasing scheduled mealtime insulin to 5 units 3 times a day 3. Continuing sliding scale insulin with Accu-Cheks every before meals & at bedtime      Intervention Category Intermediate Interventions: Hyperglycemia - evaluation and treatment  Lawanda CousinsJennings Coner Gibbard 04/09/2016, 10:04 PM

## 2016-04-09 NOTE — Care Management (Signed)
Received email from [facility] Laurie Veasey stating that she never received [facility] form that [facility]. I have faxed form again (copy on patient's physical chart) [phone]- confirmation obtained that fax went through.

## 2016-04-10 ENCOUNTER — Inpatient Hospital Stay: Payer: Non-veteran care

## 2016-04-10 DIAGNOSIS — R570 Cardiogenic shock: Secondary | ICD-10-CM

## 2016-04-10 DIAGNOSIS — N179 Acute kidney failure, unspecified: Secondary | ICD-10-CM

## 2016-04-10 LAB — GLUCOSE, CAPILLARY
GLUCOSE-CAPILLARY: 52 mg/dL — AB (ref 65–99)
GLUCOSE-CAPILLARY: 55 mg/dL — AB (ref 65–99)
GLUCOSE-CAPILLARY: 78 mg/dL (ref 65–99)
GLUCOSE-CAPILLARY: 173 mg/dL — AB (ref 65–99)
GLUCOSE-CAPILLARY: 88 mg/dL (ref 65–99)
Glucose-Capillary: 212 mg/dL — ABNORMAL HIGH (ref 65–99)

## 2016-04-10 LAB — BASIC METABOLIC PANEL
ANION GAP: 9 (ref 5–15)
BUN: 38 mg/dL — ABNORMAL HIGH (ref 6–20)
CALCIUM: 8.4 mg/dL — AB (ref 8.9–10.3)
CO2: 23 mmol/L (ref 22–32)
Chloride: 96 mmol/L — ABNORMAL LOW (ref 101–111)
Creatinine, Ser: 1.35 mg/dL — ABNORMAL HIGH (ref 0.61–1.24)
GFR, EST NON AFRICAN AMERICAN: 53 mL/min — AB (ref >60)
GLUCOSE: 60 mg/dL — AB (ref 65–99)
POTASSIUM: 3.7 mmol/L (ref 3.5–5.1)
Sodium: 128 mmol/L — ABNORMAL LOW (ref 135–145)

## 2016-04-10 LAB — CARBOXYHEMOGLOBIN
Carboxyhemoglobin: 2.2 % — ABNORMAL HIGH (ref 0.5–1.5)
METHEMOGLOBIN: 1.3 % (ref 0.0–1.5)
O2 Saturation: 44.5 %
Total oxygen content: 71 mL/dL

## 2016-04-10 MED ORDER — MORPHINE SULFATE (PF) 2 MG/ML IV SOLN: 1.0000 mg | INTRAVENOUS | Status: DC | PRN

## 2016-04-10 MED ORDER — POTASSIUM CHLORIDE CRYS ER 20 MEQ PO TBCR
40.0000 meq | EXTENDED_RELEASE_TABLET | Freq: Once | ORAL | Status: AC
  Administered 2016-04-10: 40 meq via ORAL
  Filled 2016-04-10: qty 2

## 2016-04-10 MED ORDER — FUROSEMIDE 10 MG/ML IJ SOLN
80.0000 mg | Freq: Two times a day (BID) | INTRAMUSCULAR | Status: DC
  Administered 2016-04-10 – 2016-04-12 (×6): 80 mg via INTRAVENOUS
  Filled 2016-04-10 (×6): qty 8

## 2016-04-10 MED ORDER — AMIODARONE HCL 200 MG PO TABS
200.0000 mg | ORAL_TABLET | Freq: Two times a day (BID) | ORAL | Status: DC
  Administered 2016-04-10 – 2016-04-12 (×4): 200 mg via ORAL
  Filled 2016-04-10 (×4): qty 1

## 2016-04-10 NOTE — Progress Notes (Signed)
[facility] - [place] at [facility]   PATIENT NAME: Kyle Morton    MR#:  [id number]  DATE OF BIRTH:  [birth year]  SUBJECTIVE:  Feels weak, constipation relieved On dopamine drip UOP 1500cc Off Amiodarone gtt---on po amiodarone Dter int he room  REVIEW OF SYSTEMS:   Review of Systems  Constitutional: Negative for chills, fever and weight loss.  HENT: Negative for ear discharge, ear pain and nosebleeds.   Eyes: Negative for blurred vision, pain and discharge.  Respiratory: Positive for shortness of breath. Negative for wheezing and stridor.   Cardiovascular: Positive for orthopnea and leg swelling. Negative for chest pain, palpitations and PND.  Gastrointestinal: Negative for abdominal pain, diarrhea, nausea and vomiting.  Genitourinary: Negative for frequency and urgency.  Musculoskeletal: Negative for back pain and joint pain.  Neurological: Positive for weakness. Negative for sensory change, speech change and focal weakness.  Psychiatric/Behavioral: Negative for depression and hallucinations. The patient is not nervous/anxious.    Tolerating Diet:yes Tolerating PT: pending  DRUG ALLERGIES:  No Known Allergies  VITALS:  Blood pressure 96/78, pulse 71, temperature 97.6 F (36.4 C), temperature source Oral, resp. rate 18, height 5\' 6"  (1.676 m), weight 80.1 kg (176 lb 9.4 oz), SpO2 98 %.  PHYSICAL EXAMINATION:   Physical Exam  GENERAL:  65 y.o.-year-old patient lying in the bed with no acute distress.  EYES: Pupils equal, round, reactive to light and accommodation. No scleral icterus. Extraocular muscles intact.  HEENT: Head atraumatic, normocephalic. Oropharynx and nasopharynx clear.  NECK:  Supple, no jugular venous distention. No thyroid enlargement, no tenderness.  LUNGS: Normal breath sounds bilaterally, no wheezing,bilateral rales,no rhonchi.  No use of accessory muscles of respiration.  CARDIOVASCULAR: S1, S2 normal. No murmurs, rubs, or  gallops.  ABDOMEN: Soft, nontender, nondistended. Bowel sounds present. No organomegaly or mass.  EXTREMITIES: No cyanosis, clubbing  ++edema b/l.    NEUROLOGIC: Cranial nerves II through XII are intact. No focal Motor or sensory deficits b/l.   PSYCHIATRIC:  patient is alert and oriented x 3.  SKIN: No obvious rash, lesion, or ulcer.   LABORATORY PANEL:  CBC  Recent Labs Lab 04/09/16 0435  WBC 7.4  HGB 14.0  HCT 41.6  PLT 235    Chemistries   Recent Labs Lab 04/06/16 1205  04/08/16 0312  04/10/16 0434  NA 128*  < > 131*  < > 128*  K 3.5  < > 3.5  < > 3.7  CL 91*  < > 98*  < > 96*  CO2 26  < > 26  < > 23  GLUCOSE 264*  < > 131*  < > 60*  BUN 50*  < > 50*  < > 38*  CREATININE 1.72*  < > 1.75*  < > 1.35*  CALCIUM 8.7*  < > 8.4*  < > 8.4*  MG  --   < > 2.3  --   --   AST 53*  --   --   --   --   ALT 31  --   --   --   --   ALKPHOS 177*  --   --   --   --   BILITOT 5.9*  --   --   --   --   < > = values in this interval not displayed. Cardiac Enzymes  Recent Labs Lab 04/07/16 0455  TROPONINI 0.06*   RADIOLOGY:  No results found. ASSESSMENT AND PLAN:  1. Acute respiratory distress: -  Transferred to step-down overnight 2/2 hypotension and hypothermia  -On 3.5 L via nasal cannula, wean as able---98% on RA -lasix started today 80 mg bid by Dr Freida Busmanalton -on dopamine gtt.  - Creatinine improving down to 1.3 -1.41---IV lasix---1.75--stopped lasix, started IV dopamine--1.5--1.35 -cardiology recommendation today 04/10/16---place PICC line and CVP monitoring  2. Acute on chronic combined CHF -Diuresis now resumed from today -Na has decreased to 128this morning -128--133--131--128 -BB and Entresto on hold -now on po amiodarone due to freq ectopy  3. Sepsis 2/2 RLL PNA/hypotension -I think this is all due to HF -cont zosyn and zithromax---change to po augementin (2 days) -wbc normal  4. Elevated troponin/CAD: -No symptoms of angina -Troponin mildly elevated and  flat trending likely in the setting of supply demand ischemia   5. CKD stage III: -Improved this morning with diuresis -no ARB or ACE due to hypotension and CKD-III  6. Hypertensive heart disease: -BP soft as above -Antihypertensives on hold  7. Constipation-resolved Dulcolax supp and or enema  Case discussed with Care Management/Social Worker. Management plans discussed with the patient, family and they are in agreement.  CODE STATUS: full  DVT Prophylaxis: lovenox  TOTAL  TIME TAKING CARE OF THIS PATIENT: 30 minutes.  >50% time spent on counselling and coordination of care pt and family  POSSIBLE D/C IN 2-3 DAYS, DEPENDING ON CLINICAL CONDITION.  Note: This dictation was prepared with Dragon dictation along with smaller phrase technology. Any transcriptional errors that result from this process are unintentional.  Mikka Kissner M.D on 04/10/2016 at 2:29 PM  Between 7am to 6pm - Pager - [phone]  After 6pm go to [email or web address] - password [email or web address] [facility]  [facility]  Office  [phone]  CC: Primary care physician; Toya SmothersLANCE TEGEN, PA

## 2016-04-10 NOTE — Progress Notes (Addendum)
SUBJECTIVE: Comfortable in bed, orthopneic.  Remains on dopamine at 5, SBP in 90s-100s.  Dopamine running peripherally.   Scheduled Meds: . amiodarone  400 mg Oral BID  . amoxicillin-clavulanate  1 tablet Oral Q12H  . aspirin EC  81 mg Oral Daily  . atorvastatin  80 mg Oral Daily  . cholecalciferol  2,000 Units Oral Daily  . clopidogrel  75 mg Oral Daily  . dextrose  25 mL Intravenous Once  . docusate sodium  100 mg Oral BID  . feeding supplement (ENSURE ENLIVE)  237 mL Oral BID BM  . furosemide  80 mg Intravenous BID  . heparin subcutaneous  5,000 Units Subcutaneous Q8H  . insulin aspart  0-5 Units Subcutaneous QHS  . insulin aspart  0-9 Units Subcutaneous TID WC  . insulin aspart  5 Units Subcutaneous TID AC  . insulin glargine  16 Units Subcutaneous QHS  . potassium chloride  40 mEq Oral Once  . trolamine salicylate   Topical BID  . venlafaxine XR  150 mg Oral Q breakfast   Continuous Infusions: . DOPamine 5.128 mcg/kg/min (04/09/16 1939)   PRN Meds:.bisacodyl, morphine injection, nitroGLYCERIN, polyethylene glycol, sodium phosphate    Vitals:   04/10/16 0600 04/10/16 0700 04/10/16 0800 04/10/16 0900  BP: (!) 123/98 (!) 122/93 105/88 102/84  Pulse: 78 77 97 71  Resp: 19 (!) 22 18 18   Temp:   97.9 F (36.6 C)   TempSrc:   Axillary   SpO2: (!) 89% 98% 96% 96%  Weight:      Height:        Intake/Output Summary (Last 24 hours) at 04/10/16 1037 Last data filed at 04/10/16 0800  Gross per 24 hour  Intake           1282.4 ml  Output              650 ml  Net            632.4 ml    LABS: Basic Metabolic Panel:  Recent Labs  40/98/1108/02/16 0312 04/09/16 0435 04/10/16 0434  NA 131* 130* 128*  K 3.5 3.1* 3.7  CL 98* 97* 96*  CO2 26 25 23   GLUCOSE 131* 109* 60*  BUN 50* 42* 38*  CREATININE 1.75* 1.51* 1.35*  CALCIUM 8.4* 8.2* 8.4*  MG 2.3  --   --   PHOS 4.4  --   --    Liver Function Tests: No results for input(s): AST, ALT, ALKPHOS, BILITOT, PROT, ALBUMIN  in the last 72 hours. No results for input(s): LIPASE, AMYLASE in the last 72 hours. CBC:  Recent Labs  04/08/16 0312 04/09/16 0435  WBC 7.8 7.4  HGB 13.7 14.0  HCT 40.4 41.6  MCV 101.2* 102.0*  PLT 253 235   Cardiac Enzymes: No results for input(s): CKTOTAL, CKMB, CKMBINDEX, TROPONINI in the last 72 hours. BNP: Invalid input(s): POCBNP D-Dimer: No results for input(s): DDIMER in the last 72 hours. Hemoglobin A1C: No results for input(s): HGBA1C in the last 72 hours. Fasting Lipid Panel: No results for input(s): CHOL, HDL, LDLCALC, TRIG, CHOLHDL, LDLDIRECT in the last 72 hours. Thyroid Function Tests: No results for input(s): TSH, T4TOTAL, T3FREE, THYROIDAB in the last 72 hours.  Invalid input(s): FREET3 Anemia Panel: No results for input(s): VITAMINB12, FOLATE, FERRITIN, TIBC, IRON, RETICCTPCT in the last 72 hours.  RADIOLOGY: Dg Chest 2 View  Result Date: 04/06/2016 CLINICAL DATA:  Shortness of Breath EXAM: CHEST  2 VIEW COMPARISON:  January 28, 2015 FINDINGS: There is airspace consolidation in the right lower lobe. Lungs elsewhere are clear. There is mild cardiomegaly with pulmonary vascularity within normal limits. There is atherosclerotic calcification in the aorta. No adenopathy. No bone lesions. IMPRESSION: Airspace consolidation in the right lower lobe consistent with pneumonia. Lungs elsewhere clear. Mild cardiomegaly present. There is aortic atherosclerosis. Followup PA and lateral chest radiographs recommended in 3-4 weeks following trial of antibiotic therapy to ensure resolution and exclude underlying malignancy. Electronically Signed   By: Bretta Bang III M.D.   On: 04/06/2016 12:28    PHYSICAL EXAM General: NAD Neck: JVP 14+ cm, no thyromegaly or thyroid nodule.  Lungs: Crackles dependently CV: Lateral PMI.  Heart regular S1/S2, no S3/S4, no murmur.  1+ edema to knees bilaterally.   Abdomen: Soft, nontender, no hepatosplenomegaly, no distention.    Neurologic: Alert and oriented x 3.  Psych: Normal affect. Extremities: No clubbing or cyanosis.   TELEMETRY: Reviewed telemetry pt in NSR  ASSESSMENT AND PLAN: 65 yo with history of CAD, CKD, ischemic cardiomyopathy, paroxysmal atrial fibrillation admitted with acute/chronic systolic CHF, ?PNA.  He has been hypotensive and is now on dopamine.   1. Acute on chronic systolic CHF: Ischemic cardiomyopathy. Echo this admission with EF 20-25%, inferior akinesis, moderate MR, moderately decreased RV systolic function.  On exam, he is volume overloaded.  He has been on dopamine due to low BP.  I suspect low output heart failure, now with considerable volume overload.  - Would like PICC line to monitor CVP and co-ox.  Will check co-ox on dopamine once PICC is in place, would consider transition to milrinone.  Needs formal RHC after diuresis.  - Creatinine better.  Needs diuresis given significant volume overload.  Start Lasix 80 mg IV bid, 1st dose now.  - No beta blocker with suspected low output, hold ACEI/ARB/ARNI while BP low. May add spironolactone in am.  - Long-term, I am concerned that he may need advanced therapies.  He would potentially be an LVAD candidate.  We discussed followup in the CHF clinic at [facility].  - QRS not wide enough that he would benefit from CRT.  Will need to consider ICD in future.  2. AKI on CKD: Creatinine improving, likely due to better cardiac output on dopamine.  3. Atrial fibrillation: Paroxysmal.  Currently in NSR.   - Think we can decrease amiodarone to 200 mg bid.   - He is not anticoagulated, will discuss this with Dr Mariah Milling.  4. CAD: S/p multiple PCIs.  No chest pain.  Should probably have repeat cath at some point, ideally RHC/LHC after he is fully diuresed.  - Continue ASA 81, Plavix, statin.  5. PNA: ?PNA, on Augmentin per primary service.   35 minutes critical care time.   Marca Ancona 04/10/2016 10:44 AM

## 2016-04-11 LAB — BASIC METABOLIC PANEL
Anion gap: 7 (ref 5–15)
BUN: 38 mg/dL — ABNORMAL HIGH (ref 6–20)
CHLORIDE: 99 mmol/L — AB (ref 101–111)
CO2: 22 mmol/L (ref 22–32)
CREATININE: 1.42 mg/dL — AB (ref 0.61–1.24)
Calcium: 8.6 mg/dL — ABNORMAL LOW (ref 8.9–10.3)
GFR calc non Af Amer: 50 mL/min — ABNORMAL LOW (ref >60)
GFR, EST AFRICAN AMERICAN: 58 mL/min — AB (ref >60)
Glucose, Bld: 50 mg/dL — ABNORMAL LOW (ref 65–99)
POTASSIUM: 4.6 mmol/L (ref 3.5–5.1)
Sodium: 128 mmol/L — ABNORMAL LOW (ref 135–145)

## 2016-04-11 LAB — CARBOXYHEMOGLOBIN
CARBOXYHEMOGLOBIN: 2.9 % — AB (ref 0.5–1.5)
Carboxyhemoglobin: 2.9 % — ABNORMAL HIGH (ref 0.5–1.5)
METHEMOGLOBIN: 1.4 % (ref 0.0–1.5)
METHEMOGLOBIN: 1.2 % (ref 0.0–1.5)
O2 Saturation: 98.4 %

## 2016-04-11 LAB — GLUCOSE, CAPILLARY
GLUCOSE-CAPILLARY: 44 mg/dL — AB (ref 65–99)
Glucose-Capillary: 109 mg/dL — ABNORMAL HIGH (ref 65–99)
Glucose-Capillary: 39 mg/dL — CL (ref 65–99)
Glucose-Capillary: 192 mg/dL — ABNORMAL HIGH (ref 65–99)
Glucose-Capillary: 190 mg/dL — ABNORMAL HIGH (ref 65–99)
Glucose-Capillary: 204 mg/dL — ABNORMAL HIGH (ref 65–99)

## 2016-04-11 LAB — CBC
HCT: 42.9 % (ref 40.0–52.0)
HEMOGLOBIN: 14.7 g/dL (ref 13.0–18.0)
MCH: 34.3 pg — ABNORMAL HIGH (ref 26.0–34.0)
MCHC: 34.2 g/dL (ref 32.0–36.0)
MCV: 100.3 fL — ABNORMAL HIGH (ref 80.0–100.0)
PLATELETS: 255 10*3/uL (ref 150–440)
RBC: 4.28 MIL/uL — AB (ref 4.40–5.90)
RDW: 15.8 % — ABNORMAL HIGH (ref 11.5–14.5)
WBC: 8.1 10*3/uL (ref 3.8–10.6)

## 2016-04-11 MED ORDER — NOREPINEPHRINE 4 MG/250ML-% IV SOLN: 3.0000 ug/min | INTRAVENOUS | Status: DC

## 2016-04-11 MED ORDER — MILRINONE LACTATE IN DEXTROSE 20-5 MG/100ML-% IV SOLN: 0.2500 ug/kg/min | INTRAVENOUS | Status: DC

## 2016-04-11 MED ORDER — NOREPINEPHRINE BITARTRATE 1 MG/ML IV SOLN: 3.0000 ug/min | INTRAVENOUS | Status: DC

## 2016-04-11 MED ORDER — MILRINONE LACTATE IN DEXTROSE 20-5 MG/100ML-% IV SOLN
0.2500 ug/kg/min | INTRAVENOUS | Status: DC
  Administered 2016-04-11 – 2016-04-12 (×2): 0.25 ug/kg/min via INTRAVENOUS
  Filled 2016-04-11 (×3): qty 100

## 2016-04-11 MED ORDER — METOLAZONE 2.5 MG PO TABS
2.5000 mg | ORAL_TABLET | Freq: Once | ORAL | Status: AC
  Administered 2016-04-11: 2.5 mg via ORAL
  Filled 2016-04-11: qty 1

## 2016-04-11 MED ORDER — DEXTROSE 50 % IV SOLN: 25.0000 mL | Freq: Once | INTRAVENOUS | Status: AC

## 2016-04-11 MED ORDER — DEXTROSE 50 % IV SOLN
INTRAVENOUS | Status: AC
  Administered 2016-04-11: 25 mL via INTRAVENOUS
  Filled 2016-04-11: qty 50

## 2016-04-11 NOTE — Progress Notes (Signed)
Patient being followed by hospitalist service and cardiology, not currently being followed by CCM. Asked by RN re hyponatremia.  Noted that patient has chronic systolic CHF, suspect that his hyponatremia is chronic due to his CHF. Can  be monitored but no acute intervention is likely required.  Wells Guiles-Deep Dorotha Hirschi, M.D. 04/11/2016

## 2016-04-11 NOTE — Progress Notes (Signed)
[facility] - [place] [facility]   PATIENT NAME: Kyle Morton    MR#:  [id number]  DATE OF BIRTH:  05/05/1951  SUBJECTIVE:   Remains volume overloaded and CVP noted to be 20.  Seen by Cards and plan on starting milrinone today.  Cont. IV diuresis w/ Lasix.   REVIEW OF SYSTEMS:   Review of Systems  Constitutional: Negative for chills and fever.  HENT: Negative for congestion and tinnitus.   Eyes: Negative for blurred vision and double vision.  Respiratory: Positive for shortness of breath. Negative for cough and wheezing.   Cardiovascular: Positive for orthopnea and leg swelling. Negative for chest pain and PND.  Gastrointestinal: Negative for abdominal pain, diarrhea, nausea and vomiting.  Genitourinary: Negative for dysuria and hematuria.  Neurological: Negative for dizziness, sensory change and focal weakness.  All other systems reviewed and are negative.  Tolerating Diet: yes Tolerating PT: Await Eval  DRUG ALLERGIES:  No Known Allergies  VITALS:  Blood pressure (!) 91/58, pulse 72, temperature 98.4 F (36.9 C), temperature source Axillary, resp. rate (!) 22, height 5\' 6"  (1.676 m), weight 80.6 kg (177 lb 11.1 oz), SpO2 99 %.  PHYSICAL EXAMINATION:   Physical Exam  GENERAL:  65 y.o.-year-old patient lying in the bed in no acute distress.  EYES: Pupils equal, round, reactive to light and accommodation. No scleral icterus. Extraocular muscles intact.  HEENT: Head atraumatic, normocephalic. Oropharynx and nasopharynx clear.  NECK:  Supple, no jugular venous distention. No thyroid enlargement, no tenderness.  LUNGS: Normal breath sounds bilaterally, no wheezing, bibasilar rales,no rhonchi.  No use of accessory muscles of respiration.  CARDIOVASCULAR: S1, S2 normal. No murmurs, rubs, or gallops.  ABDOMEN: Soft, nontender, nondistended. Bowel sounds present. No organomegaly or mass.  EXTREMITIES: No cyanosis, clubbing, ++ edema b/l.     NEUROLOGIC: Cranial nerves II through XII are intact. No focal Motor or sensory deficits b/l. Globally weak.  PSYCHIATRIC:  patient is alert and oriented x 3.  SKIN: No obvious rash, lesion, or ulcer.   LABORATORY PANEL:  CBC  Recent Labs Lab 04/11/16 0435  WBC 8.1  HGB 14.7  HCT 42.9  PLT 255    Chemistries   Recent Labs Lab 04/06/16 1205  04/08/16 0312  04/11/16 0435  NA 128*  < > 131*  < > 128*  K 3.5  < > 3.5  < > 4.6  CL 91*  < > 98*  < > 99*  CO2 26  < > 26  < > 22  GLUCOSE 264*  < > 131*  < > 50*  BUN 50*  < > 50*  < > 38*  CREATININE 1.72*  < > 1.75*  < > 1.42*  CALCIUM 8.7*  < > 8.4*  < > 8.6*  MG  --   < > 2.3  --   --   AST 53*  --   --   --   --   ALT 31  --   --   --   --   ALKPHOS 177*  --   --   --   --   BILITOT 5.9*  --   --   --   --   < > = values in this interval not displayed. Cardiac Enzymes  Recent Labs Lab 04/07/16 0455  TROPONINI 0.06*   RADIOLOGY:  Dg Chest Port 1 View  Result Date: 04/10/2016 CLINICAL DATA:  PICC line placement EXAM: PORTABLE CHEST 1 VIEW COMPARISON:  04/06/2016 FINDINGS: 1530 hours. Right PICC line tip overlies the distal SVC. The cardio pericardial silhouette is enlarged. Right base airspace disease persists. Left lung remains clear. Telemetry leads overlie the chest. IMPRESSION: Right PICC line tip overlies the distal SVC level. Electronically Signed   By: Kennith CenterEric  Mansell M.D.   On: 04/10/2016 15:50   ASSESSMENT AND PLAN:  1. Acute respiratory Failure - due to Acute on chronic systolic CHF. - pt. Was not responding dopamine drip and IV diuresis. -Seen by cardiology and plan for milrinone drip and IV diuretics. -Continue supportive care and wean off oxygen as tolerated.  2. Acute on chronic systolic CHF -Did not respond to IV diuretics and dopamine and now will be started on IV milrinone as per cardiology. -Follow I's and O's and daily weights. - cont. Further care as per Cardiology.   - cont. Metolazone.     3. Pneumonia - resp. Symptoms due to CHF and not likely pneumonia.  - cont. Augmentin.  -wbc normal  4. Elevated troponin/CAD: -No symptoms of angina -Troponin mildly elevated and flat trending likely in the setting of supply demand ischemia   5. CKD stage III: - follow with diuresis and will monitor.   6. Constipation-resolved Dulcolax supp and or enema  Case discussed with Care Management/Social Worker. Management plans discussed with the patient, family and they are in agreement.  CODE STATUS: full  DVT Prophylaxis: lovenox  TOTAL  TIME TAKING CARE OF THIS PATIENT: 30 minutes.   POSSIBLE D/C IN 2-3 DAYS, DEPENDING ON CLINICAL CONDITION.  Note: This dictation was prepared with Dragon dictation along with smaller phrase technology. Any transcriptional errors that result from this process are unintentional.  Houston SirenSAINANI,VIVEK J M.D on 04/11/2016 at 1:52 PM  Between 7am to 6pm - Pager - [phone]  After 6pm go to [email or web address] - password [email or web address] [facility]  [facility]  Office  [phone]  CC: Primary care physician; Toya SmothersLANCE TEGEN, PA

## 2016-04-11 NOTE — Progress Notes (Addendum)
Patient ID: Kyle Morton, male   DOB: 04/04/1951, 65 y.o.   MRN: [id number]    SUBJECTIVE: Comfortable in bed, orthopneic.  Remains on dopamine at 5, SBP in 110s-120s.  Co-ox 45% yesterday pm, inaccurate this morning.  CVP 19-20 range.  Some diuresis but not much yesterday.   Scheduled Meds: . amiodarone  200 mg Oral BID  . amoxicillin-clavulanate  1 tablet Oral Q12H  . aspirin EC  81 mg Oral Daily  . atorvastatin  80 mg Oral Daily  . cholecalciferol  2,000 Units Oral Daily  . clopidogrel  75 mg Oral Daily  . docusate sodium  100 mg Oral BID  . feeding supplement (ENSURE ENLIVE)  237 mL Oral BID BM  . furosemide  80 mg Intravenous BID  . heparin subcutaneous  5,000 Units Subcutaneous Q8H  . insulin aspart  0-5 Units Subcutaneous QHS  . insulin aspart  0-9 Units Subcutaneous TID WC  . insulin aspart  5 Units Subcutaneous TID AC  . insulin glargine  16 Units Subcutaneous QHS  . metolazone  2.5 mg Oral Once  . trolamine salicylate   Topical BID  . venlafaxine XR  150 mg Oral Q breakfast   Continuous Infusions: . milrinone    . milrinone     PRN Meds:.bisacodyl, morphine injection, nitroGLYCERIN, polyethylene glycol, sodium phosphate    Vitals:   04/11/16 0500 04/11/16 0600 04/11/16 0700 04/11/16 0800  BP: (!) 119/95 (!) 116/105 120/77 (!) 117/98  Pulse: 77 73 77 70  Resp: (!) 22 (!) 24 (!) 22 (!) 24  Temp:    97.5 F (36.4 C)  TempSrc:    Oral  SpO2: 92% 99% 94% 94%  Weight:      Height:        Intake/Output Summary (Last 24 hours) at 04/11/16 1039 Last data filed at 04/11/16 0800  Gross per 24 hour  Intake            309.6 ml  Output              935 ml  Net           -625.4 ml    LABS: Basic Metabolic Panel:  Recent Labs  78/29/5608/04/18 0434 04/11/16 0435  NA 128* 128*  K 3.7 4.6  CL 96* 99*  CO2 23 22  GLUCOSE 60* 50*  BUN 38* 38*  CREATININE 1.35* 1.42*  CALCIUM 8.4* 8.6*   Liver Function Tests: No results for input(s): AST, ALT, ALKPHOS, BILITOT, PROT,  ALBUMIN in the last 72 hours. No results for input(s): LIPASE, AMYLASE in the last 72 hours. CBC:  Recent Labs  04/09/16 0435 04/11/16 0435  WBC 7.4 8.1  HGB 14.0 14.7  HCT 41.6 42.9  MCV 102.0* 100.3*  PLT 235 255   Cardiac Enzymes: No results for input(s): CKTOTAL, CKMB, CKMBINDEX, TROPONINI in the last 72 hours. BNP: Invalid input(s): POCBNP D-Dimer: No results for input(s): DDIMER in the last 72 hours. Hemoglobin A1C: No results for input(s): HGBA1C in the last 72 hours. Fasting Lipid Panel: No results for input(s): CHOL, HDL, LDLCALC, TRIG, CHOLHDL, LDLDIRECT in the last 72 hours. Thyroid Function Tests: No results for input(s): TSH, T4TOTAL, T3FREE, THYROIDAB in the last 72 hours.  Invalid input(s): FREET3 Anemia Panel: No results for input(s): VITAMINB12, FOLATE, FERRITIN, TIBC, IRON, RETICCTPCT in the last 72 hours.  RADIOLOGY: Dg Chest 2 View  Result Date: 04/06/2016 CLINICAL DATA:  Shortness of Breath EXAM: CHEST  2 VIEW COMPARISON:  January 28, 2015 FINDINGS: There is airspace consolidation in the right lower lobe. Lungs elsewhere are clear. There is mild cardiomegaly with pulmonary vascularity within normal limits. There is atherosclerotic calcification in the aorta. No adenopathy. No bone lesions. IMPRESSION: Airspace consolidation in the right lower lobe consistent with pneumonia. Lungs elsewhere clear. Mild cardiomegaly present. There is aortic atherosclerosis. Followup PA and lateral chest radiographs recommended in 3-4 weeks following trial of antibiotic therapy to ensure resolution and exclude underlying malignancy. Electronically Signed   By: Bretta Bang III M.D.   On: 04/06/2016 12:28   Dg Chest Port 1 View  Result Date: 04/10/2016 CLINICAL DATA:  PICC line placement EXAM: PORTABLE CHEST 1 VIEW COMPARISON:  04/06/2016 FINDINGS: 1530 hours. Right PICC line tip overlies the distal SVC. The cardio pericardial silhouette is enlarged. Right base airspace disease  persists. Left lung remains clear. Telemetry leads overlie the chest. IMPRESSION: Right PICC line tip overlies the distal SVC level. Electronically Signed   By: Kennith Center M.D.   On: 04/10/2016 15:50    PHYSICAL EXAM General: NAD Neck: JVP 14+ cm, no thyromegaly or thyroid nodule.  Lungs: Crackles dependently CV: Lateral PMI.  Heart regular S1/S2, no S3/S4, no murmur.  1+ edema to knees bilaterally.   Abdomen: Soft, nontender, no hepatosplenomegaly, no distention.  Neurologic: Alert and oriented x 3.  Psych: Normal affect. Extremities: No clubbing or cyanosis.   TELEMETRY: Reviewed telemetry pt in NSR  ASSESSMENT AND PLAN: 65 yo with history of CAD, CKD, ischemic cardiomyopathy, paroxysmal atrial fibrillation admitted with acute/chronic systolic CHF, ?PNA.  He has been hypotensive and is now on dopamine.   1. Acute on chronic systolic CHF: Ischemic cardiomyopathy. Echo this admission with EF 20-25%, inferior akinesis, moderate MR, moderately decreased RV systolic function.  On exam, he is volume overloaded.  Low output HF, co-ox 45% yesterday on dopamine 5.  BP stable today.  CVP 19-20.  - Stop dopamine, will use milrinone 0.25 mcg/kg/min.  Repeat co-ox this afternoon on milrinone.  Needs formal RHC after diuresis.  - Continue Lasix 80 mg IV bid, add metolazone 2.5 mg x 1 today.   - No beta blocker with suspected low output, hold ACEI/ARB/ARNI for now with elevated creatinine and recent low BP.  Will add spironolactone 12.5 daily.  - Long-term, I am concerned that he may need advanced therapies.  He would potentially be an LVAD candidate.  We discussed followup in [facility] clinic at [facility].  - QRS not wide enough that he would benefit from CRT.  Will need to consider ICD in future.  2. AKI on CKD: Creatinine mildly higher, follow carefully.  May improve with diuresis as we lower renal venous pressure.  Also may get better if we can improve cardiac output with milrinone.  3. Atrial  fibrillation: Paroxysmal.  Currently in NSR.   - Continue amiodarone to 200 mg bid.   - He is not anticoagulated, will discuss this with Dr Mariah Milling.  4. CAD: S/p multiple PCIs.  No chest pain.  Should probably have repeat cath at some point, ideally RHC/LHC after he is fully diuresed.  - Continue ASA 81, Plavix, statin.  5. PNA: ?PNA, on Augmentin per primary service.  6. Hyponatremia: Likely hypervolemic hyponatremia.  Fluid restrict.   35 minutes critical care time.   Marca Ancona 04/11/2016 10:39 AM

## 2016-04-12 ENCOUNTER — Ambulatory Visit (HOSPITAL_COMMUNITY)
Admission: AD | Admit: 2016-04-12 | Discharge: 2016-04-12 | Disposition: A | Discharge status: Home / Self Care | Payer: Non-veteran care | Source: Other Acute Inpatient Hospital | Attending: Specialist | Admitting: Specialist

## 2016-04-12 DIAGNOSIS — I509 Heart failure, unspecified: Secondary | ICD-10-CM | POA: Insufficient documentation

## 2016-04-12 LAB — CARBOXYHEMOGLOBIN
CARBOXYHEMOGLOBIN: 3.2 % — AB (ref 0.5–1.5)
Methemoglobin: 1 % (ref 0.0–1.5)
O2 SAT: 97.7 %

## 2016-04-12 LAB — CULTURE, BLOOD (ROUTINE X 2)
CULTURE: NO GROWTH
CULTURE: NO GROWTH

## 2016-04-12 LAB — BASIC METABOLIC PANEL
Anion gap: 6 (ref 5–15)
BUN: 38 mg/dL — AB (ref 6–20)
CHLORIDE: 97 mmol/L — AB (ref 101–111)
CO2: 27 mmol/L (ref 22–32)
Calcium: 8 mg/dL — ABNORMAL LOW (ref 8.9–10.3)
Creatinine, Ser: 1.33 mg/dL — ABNORMAL HIGH (ref 0.61–1.24)
GFR calc Af Amer: >60 mL/min (ref >60)
GFR calc non Af Amer: 54 mL/min — ABNORMAL LOW (ref >60)
Glucose, Bld: 107 mg/dL — ABNORMAL HIGH (ref 65–99)
POTASSIUM: 3.4 mmol/L — AB (ref 3.5–5.1)
SODIUM: 130 mmol/L — AB (ref 135–145)

## 2016-04-12 LAB — CBC
HCT: 34.7 % — ABNORMAL LOW (ref 40.0–52.0)
Hemoglobin: 12.1 g/dL — ABNORMAL LOW (ref 13.0–18.0)
MCH: 34.5 pg — ABNORMAL HIGH (ref 26.0–34.0)
MCHC: 34.8 g/dL (ref 32.0–36.0)
MCV: 99.1 fL (ref 80.0–100.0)
PLATELETS: 214 10*3/uL (ref 150–440)
RBC: 3.5 MIL/uL — AB (ref 4.40–5.90)
RDW: 15.7 % — ABNORMAL HIGH (ref 11.5–14.5)
WBC: 6.3 10*3/uL (ref 3.8–10.6)

## 2016-04-12 LAB — GLUCOSE, CAPILLARY
GLUCOSE-CAPILLARY: 98 mg/dL (ref 65–99)
GLUCOSE-CAPILLARY: 135 mg/dL — AB (ref 65–99)
GLUCOSE-CAPILLARY: 158 mg/dL — AB (ref 65–99)
Glucose-Capillary: 132 mg/dL — ABNORMAL HIGH (ref 65–99)
Glucose-Capillary: 138 mg/dL — ABNORMAL HIGH (ref 65–99)

## 2016-04-12 IMAGING — CT CT HEAD W/O CM
2 series · 14 of 30 positions shown, 16 images · non-contrast
Comparison: None.

CLINICAL DATA: Weakness.  Dizziness.  Fall.

EXAM:
CT HEAD WITHOUT CONTRAST
TECHNIQUE: Contiguous axial images were obtained from the base of the skull
through the vertex without intravenous contrast.

[Series 2: head wo · axial · 0.42mm/px · z∈[-106,-6]mm · 6 of 30 slices shown, 8 images]
[im 5/30  brain]
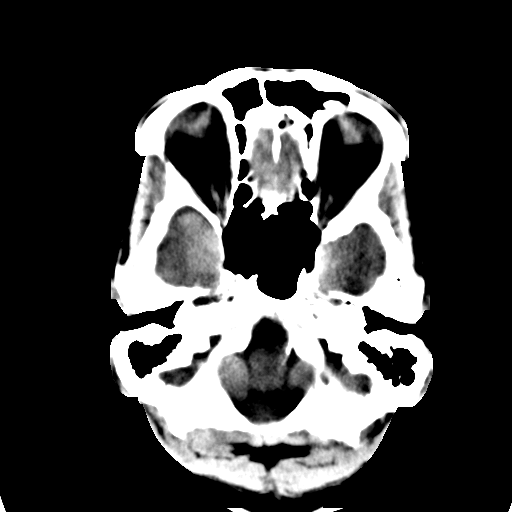
[im 5/30  bone]
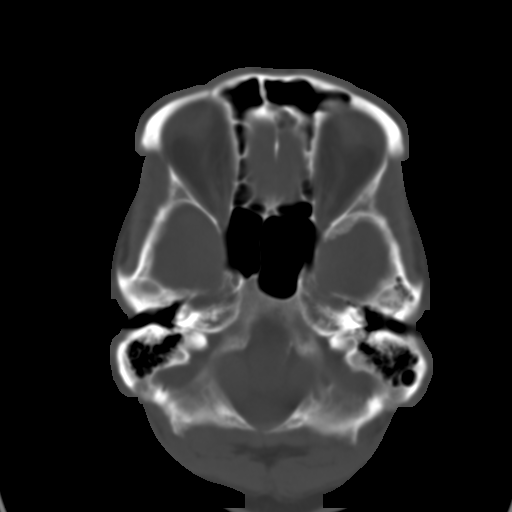
[im 9/30  brain]
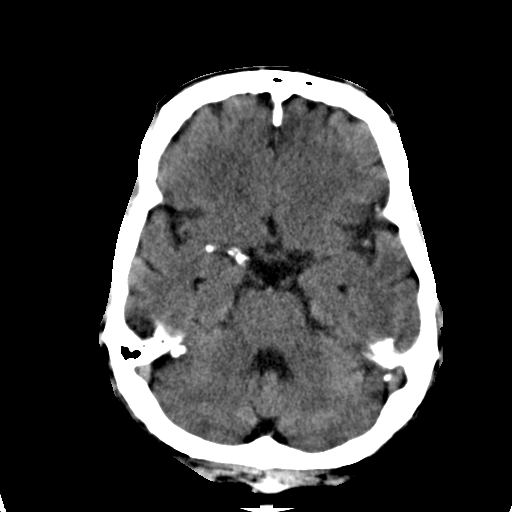
[im 13/30  brain]
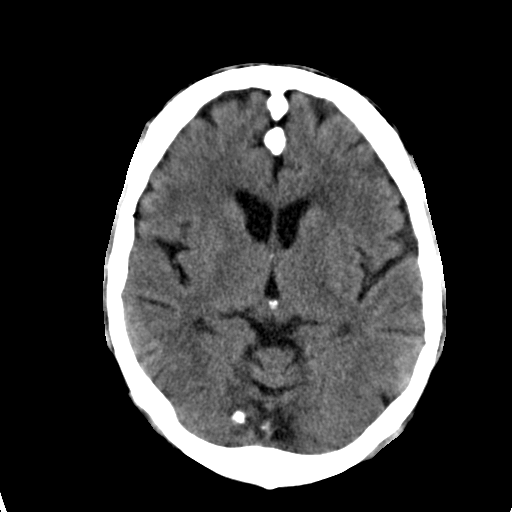
[im 17/30  brain]
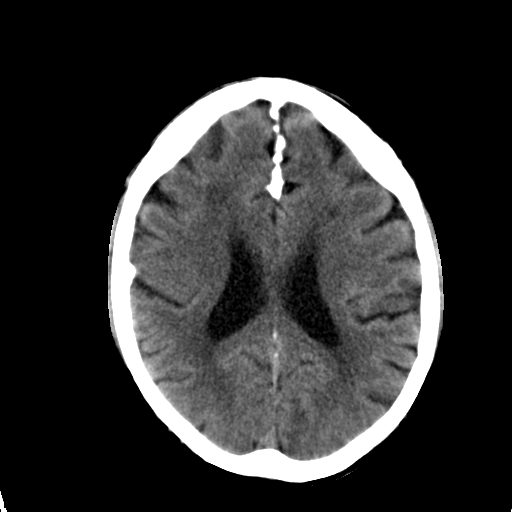
[im 21/30  brain]
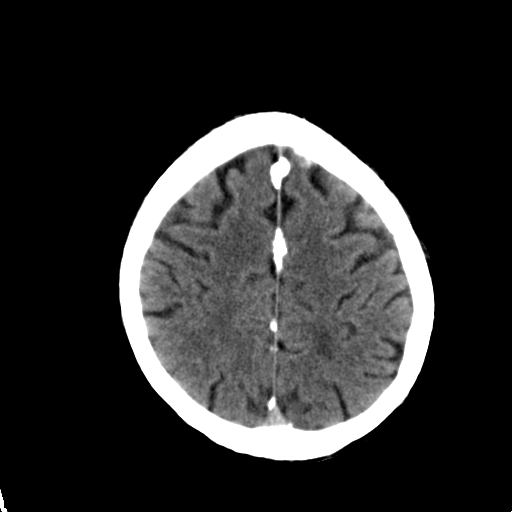
[im 21/30  bone]
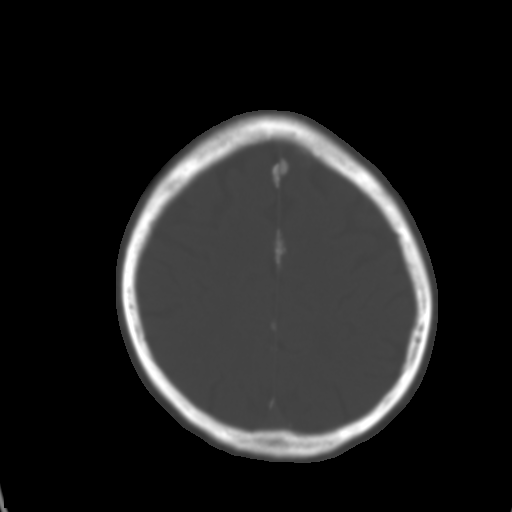
[im 25/30  brain]
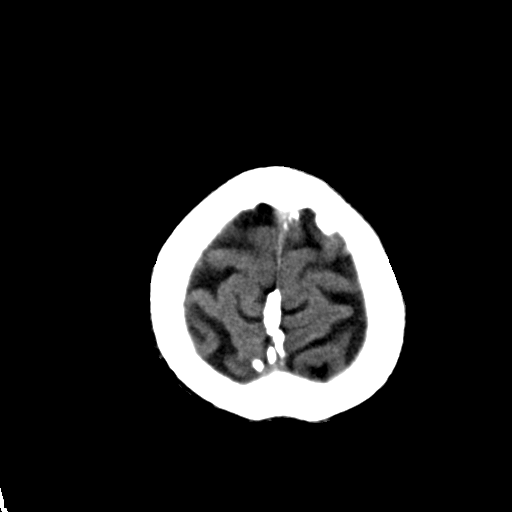

[Series 3: head bone · axial · 0.42mm/px · z∈[-120,+16]mm · 8 of 85 slices shown]
[im 9/85  bone]
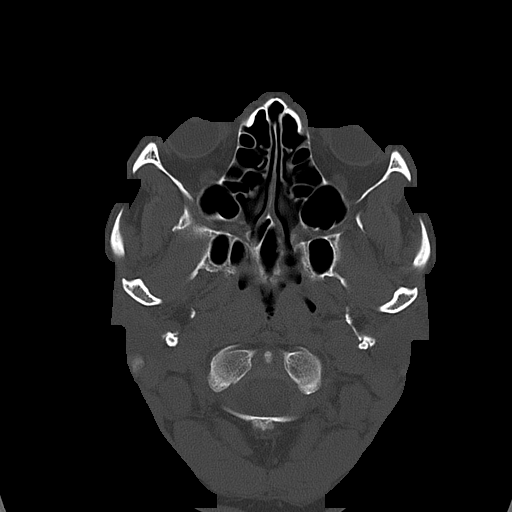
[im 17/85  bone]
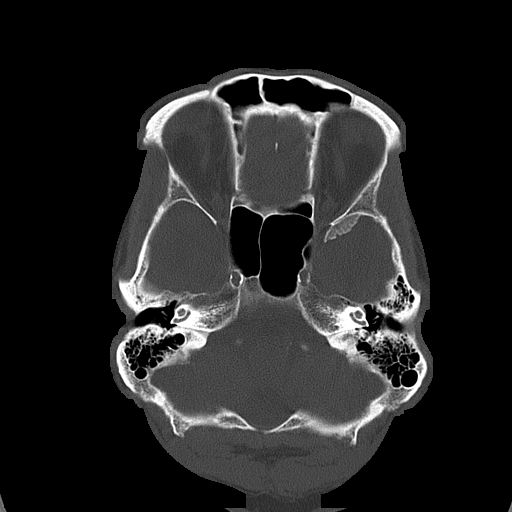
[im 29/85  bone]
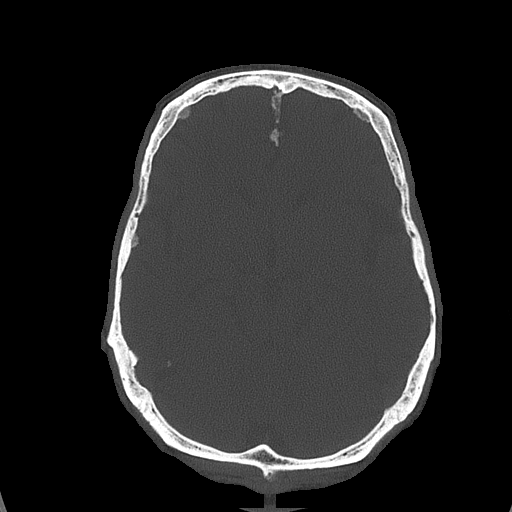
[im 37/85  bone]
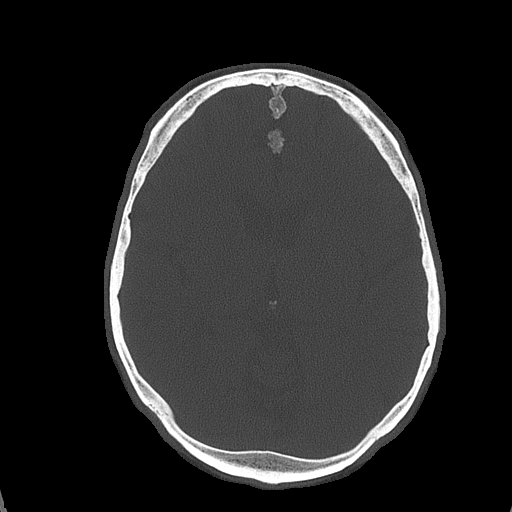
[im 49/85  bone]
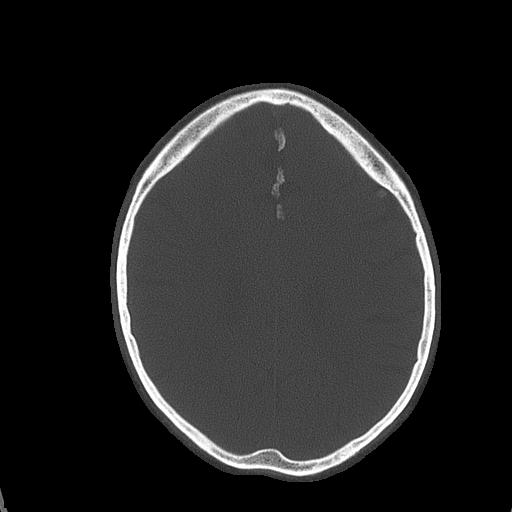
[im 57/85  bone]
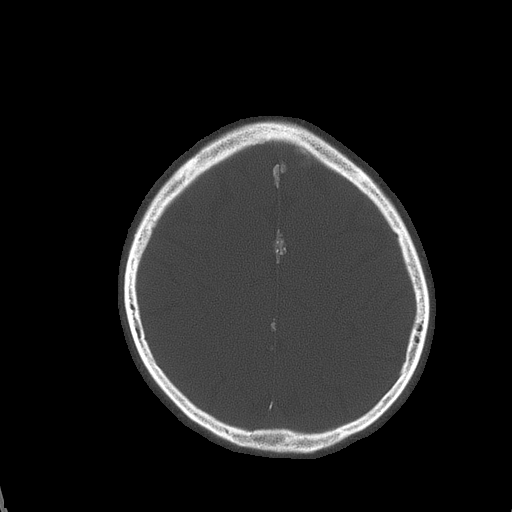
[im 69/85  bone]
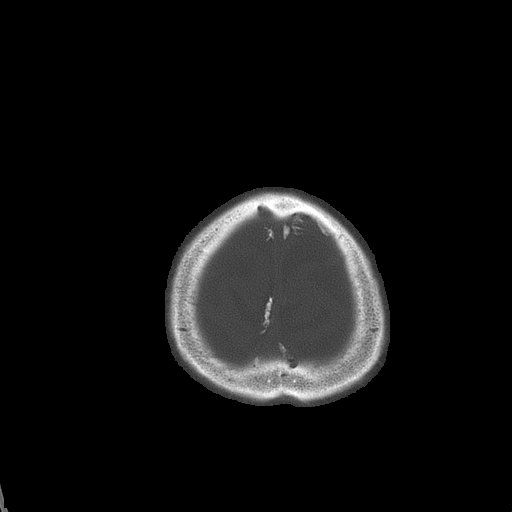
[im 77/85  bone]
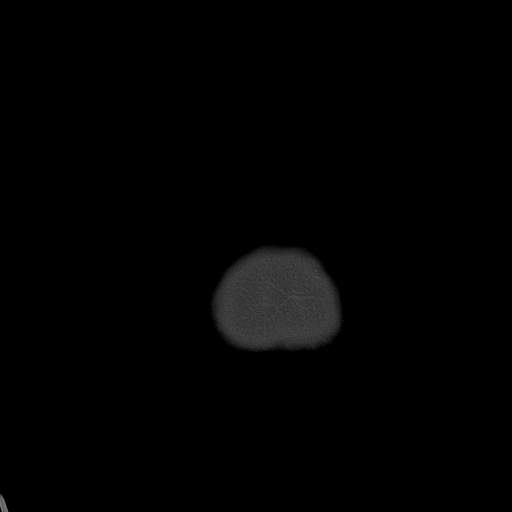

[14 of 30 positions shown; findings below may reference images not displayed]

FINDINGS: The brainstem, cerebellum, cerebral peduncles, thalamus, basal
ganglia, basilar cisterns, and ventricular system appear within
normal limits. Periventricular white matter and corona radiata
hypodensities favor chronic ischemic microvascular white matter
disease. No intracranial hemorrhage or acute CVA.

There are bilateral calcified extra-axial masses favoring meningioma
is scattered adjacent to the cerebrum including along the falx.

The porous acoustic is appears somewhat wide bilaterally although I
am not certain that there is necessarily a vestibular schwannoma.

No intracranial hemorrhage, mass lesion, or acute CVA.

There is atherosclerotic calcification of the cavernous carotid
arteries bilaterally.
IMPRESSION: 1. No acute intracranial findings are observed.
2. There appear to be multiple bilateral meningiomas, enough to
raise the possibility of neurofibromatosis 2/Multiple Inherited
Schwannomas Meningiomas and Ependymomas ("MISME"). Although not
urgent, MRI of the brain with and without contrast would be
suggested to assess for vestibular schwannoma or other supporting
evidence.
3. Periventricular white matter and corona radiata hypodensities
favor chronic ischemic microvascular white matter disease.

## 2016-04-12 MED ORDER — FUROSEMIDE 10 MG/ML IJ SOLN: 80.0000 mg | Freq: Two times a day (BID) | INTRAMUSCULAR | 0 refills | Status: AC

## 2016-04-12 MED ORDER — INSULIN GLARGINE 100 UNIT/ML ~~LOC~~ SOLN: 16.0000 [IU] | Freq: Every day | SUBCUTANEOUS | 11 refills | Status: AC

## 2016-04-12 MED ORDER — SODIUM CHLORIDE 0.9% FLUSH
10.0000 mL | Freq: Two times a day (BID) | INTRAVENOUS | Status: DC
  Administered 2016-04-12 (×2): 10 mL

## 2016-04-12 MED ORDER — SODIUM CHLORIDE 0.9% FLUSH: 10.0000 mL | INTRAVENOUS | Status: DC | PRN

## 2016-04-12 MED ORDER — MILRINONE LACTATE IN DEXTROSE 20-5 MG/100ML-% IV SOLN: 0.2500 ug/kg/min | INTRAVENOUS | Status: AC

## 2016-04-12 NOTE — Progress Notes (Signed)
[facility] called for report, [facility] to [facility] to [facility]. Patient signed ENTALA form, and family at bedside aware of transfer.

## 2016-04-12 NOTE — Progress Notes (Signed)
Patient ID: Kyle MilroyWillie Croslin, male   DOB: 03/15/1951, 65 y.o.   MRN: [id number]    SUBJECTIVE: Comfortable in bed. He was switched from dopamine to milrinone yesterday with improvement in urine output. His -1200 mL since yesterday. His weight is 176 lbs with his dry weight gain around 160 lbs. slight improvement in CVP to 16 from 21 yesterday.  Scheduled Meds: . amiodarone  200 mg Oral BID  . amoxicillin-clavulanate  1 tablet Oral Q12H  . aspirin EC  81 mg Oral Daily  . atorvastatin  80 mg Oral Daily  . cholecalciferol  2,000 Units Oral Daily  . clopidogrel  75 mg Oral Daily  . docusate sodium  100 mg Oral BID  . feeding supplement (ENSURE ENLIVE)  237 mL Oral BID BM  . furosemide  80 mg Intravenous BID  . heparin subcutaneous  5,000 Units Subcutaneous Q8H  . insulin aspart  0-5 Units Subcutaneous QHS  . insulin aspart  0-9 Units Subcutaneous TID WC  . insulin aspart  5 Units Subcutaneous TID AC  . insulin glargine  16 Units Subcutaneous QHS  . sodium chloride flush  10-40 mL Intracatheter Q12H  . trolamine salicylate   Topical BID  . venlafaxine XR  150 mg Oral Q breakfast   Continuous Infusions: . milrinone 0.25 mcg/kg/min (04/12/16 0048)  . norepinephrine Stopped (04/11/16 2030)   PRN Meds:.bisacodyl, morphine injection, nitroGLYCERIN, polyethylene glycol, sodium chloride flush, sodium phosphate    Vitals:   04/12/16 0400 04/12/16 0430 04/12/16 0500 04/12/16 0600  BP: 96/66  98/71 97/61  Pulse: 73  69 64  Resp: 15  15 15   Temp:  98.1 F (36.7 C)    TempSrc:  Oral    SpO2: 96%  97% 97%  Weight:      Height:        Intake/Output Summary (Last 24 hours) at 04/12/16 0835 Last data filed at 04/12/16 0600  Gross per 24 hour  Intake            578.7 ml  Output             1350 ml  Net           -771.3 ml    LABS: Basic Metabolic Panel:  Recent Labs  72/53/6608/05/18 0435 04/12/16 0436  NA 128* 130*  K 4.6 3.4*  CL 99* 97*  CO2 22 27  GLUCOSE 50* 107*  BUN 38* 38*    CREATININE 1.42* 1.33*  CALCIUM 8.6* 8.0*   Liver Function Tests: No results for input(s): AST, ALT, ALKPHOS, BILITOT, PROT, ALBUMIN in the last 72 hours. No results for input(s): LIPASE, AMYLASE in the last 72 hours. CBC:  Recent Labs  04/11/16 0435 04/12/16 0436  WBC 8.1 6.3  HGB 14.7 12.1*  HCT 42.9 34.7*  MCV 100.3* 99.1  PLT 255 214   Cardiac Enzymes: No results for input(s): CKTOTAL, CKMB, CKMBINDEX, TROPONINI in the last 72 hours. BNP: Invalid input(s): POCBNP D-Dimer: No results for input(s): DDIMER in the last 72 hours. Hemoglobin A1C: No results for input(s): HGBA1C in the last 72 hours. Fasting Lipid Panel: No results for input(s): CHOL, HDL, LDLCALC, TRIG, CHOLHDL, LDLDIRECT in the last 72 hours. Thyroid Function Tests: No results for input(s): TSH, T4TOTAL, T3FREE, THYROIDAB in the last 72 hours.  Invalid input(s): FREET3 Anemia Panel: No results for input(s): VITAMINB12, FOLATE, FERRITIN, TIBC, IRON, RETICCTPCT in the last 72 hours.  RADIOLOGY: Dg Chest 2 View  Result Date: 04/06/2016 CLINICAL DATA:  Shortness of Breath EXAM: CHEST  2 VIEW COMPARISON:  January 28, 2015 FINDINGS: There is airspace consolidation in the right lower lobe. Lungs elsewhere are clear. There is mild cardiomegaly with pulmonary vascularity within normal limits. There is atherosclerotic calcification in the aorta. No adenopathy. No bone lesions. IMPRESSION: Airspace consolidation in the right lower lobe consistent with pneumonia. Lungs elsewhere clear. Mild cardiomegaly present. There is aortic atherosclerosis. Followup PA and lateral chest radiographs recommended in 3-4 weeks following trial of antibiotic therapy to ensure resolution and exclude underlying malignancy. Electronically Signed   By: Bretta Bang III M.D.   On: 04/06/2016 12:28   Dg Chest Port 1 View  Result Date: 04/10/2016 CLINICAL DATA:  PICC line placement EXAM: PORTABLE CHEST 1 VIEW COMPARISON:  04/06/2016 FINDINGS:  1530 hours. Right PICC line tip overlies the distal SVC. The cardio pericardial silhouette is enlarged. Right base airspace disease persists. Left lung remains clear. Telemetry leads overlie the chest. IMPRESSION: Right PICC line tip overlies the distal SVC level. Electronically Signed   By: Kennith Center M.D.   On: 04/10/2016 15:50    PHYSICAL EXAM General: NAD Neck:Mild JVD, no thyromegaly or thyroid nodule.  Lungs:  CV: Lateral PMI.  Heart regular S1/S2, no S3/S4, no murmur.  1+ edema to knees bilaterally.   Abdomen: Soft, nontender, no hepatosplenomegaly, no distention.  Neurologic: Alert and oriented x 3.  Psych: Normal affect. Extremities: No clubbing or cyanosis.   TELEMETRY: Reviewed telemetry pt in NSR  ASSESSMENT AND PLAN: 65 yo with history of CAD, CKD, ischemic cardiomyopathy, paroxysmal atrial fibrillation admitted with acute/chronic systolic CHF, ?PNA.  He has been hypotensive and is now on dopamine.    1. Acute on chronic systolic CHF: Ischemic cardiomyopathy. Echo this admission with EF 20-25%, inferior akinesis, moderate MR, moderately decreased RV systolic function.    He continues to be volume overloaded and is significantly above his dry weight by at least 15 pounds. He seems to be responding to milrinone and thus am going to continue with the same dose and continue diuresis with furosemide 80 mg IV twice daily.  -He is currently still not on any heart failure medications due to relatively low blood pressure. Continue to hold beta blockers while he is on milrinone. Plan on starting small dose ACE inhibitor or ARB once blood pressure improves and the same for low-dose spironolactone.  - long-term he is probably going to require advanced heart failure therapy and he might be a candidate for LVAD placement.  Will need to consider ICD in future.   2. AKI on CKD: Creatinine is stable.  3. Atrial fibrillation: Paroxysmal.  Currently in NSR.   - Continue amiodarone to 200 mg  bid.   - He is not anticoagulated, recommend starting anticoagulation and stopping aspirin before hospital discharge.  4. CAD: S/p multiple PCIs.  No chest pain.  Should probably have repeat cath at some point, ideally RHC/LHC after he is fully diuresed.  - Continue ASA 81, Plavix, statin.   5. Hyponatremia: Likely hypervolemic hyponatremia.  Fluid restrict.    Lorine Bears 04/12/2016 8:35 AM

## 2016-04-12 NOTE — Progress Notes (Signed)
[facility].

## 2016-04-12 NOTE — Care Management (Signed)
Met with patient after progression rounds to re-visit transfer to [facility]. I have left patient with these VA forms and asked him to talk to his daughter Levada Dy [facility])- he agrees. He wants to stay here at [facility]. His PCP is with [facility] and unless an [facility] will follow for home health RN it will be difficult to get home health in to administer Milrinone infusion. Patient does not want to go home as he is fearful that "something will go wrong". He is not appropriate for SNF as he will not require PT. I'm told he is very independent in this room. Plan to transfer to lower level of care potentially today. RNCM to revisit patient regarding decision to transfer with [facility]. I will also check VA bed status.

## 2016-04-12 NOTE — Progress Notes (Signed)
[facility] [place] [facility]   PATIENT NAME: Kyle Morton    MR#:  [id number]  DATE OF BIRTH:  07/19/1951  SUBJECTIVE:   Remains volume overloaded and CVP down from 21-16.  Remains on Milrinone and IV lasix and responding well to it.  No chest pain or shortness of breath.   REVIEW OF SYSTEMS:   Review of Systems  Constitutional: Negative for chills and fever.  HENT: Negative for congestion and tinnitus.   Eyes: Negative for blurred vision and double vision.  Respiratory: Negative for cough, shortness of breath and wheezing.   Cardiovascular: Positive for orthopnea and leg swelling. Negative for chest pain and PND.  Gastrointestinal: Negative for abdominal pain, diarrhea, nausea and vomiting.  Genitourinary: Negative for dysuria and hematuria.  Neurological: Negative for dizziness, sensory change and focal weakness.  All other systems reviewed and are negative.  Tolerating Diet: yes Tolerating PT: Await Eval  DRUG ALLERGIES:  No Known Allergies  VITALS:  Blood pressure 91/65, pulse 84, temperature 98.3 F (36.8 C), temperature source Oral, resp. rate 16, height 5\' 6"  (1.676 m), weight 80.2 kg (176 lb 12.9 oz), SpO2 100 %.  PHYSICAL EXAMINATION:   Physical Exam  GENERAL:  [age] y.o.-year-old patient lying in the bed in no acute distress.  EYES: Pupils equal, round, reactive to light and accommodation. No scleral icterus. Extraocular muscles intact.  HEENT: Head atraumatic, normocephalic. Oropharynx and nasopharynx clear.  NECK:  Supple, no jugular venous distention. No thyroid enlargement, no tenderness.  LUNGS: Normal breath sounds bilaterally, no wheezing, bibasilar rales,no rhonchi.  No use of accessory muscles of respiration.  CARDIOVASCULAR: S1, S2 normal. No murmurs, rubs, or gallops.  ABDOMEN: Soft, nontender, nondistended. Bowel sounds present. No organomegaly or mass.  EXTREMITIES: No cyanosis, clubbing, ++ edema b/l.    NEUROLOGIC:  Cranial nerves II through XII are intact. No focal Motor or sensory deficits b/l. Globally weak.  PSYCHIATRIC:  patient is alert and oriented x 3.  SKIN: No obvious rash, lesion, or ulcer.   LABORATORY PANEL:  CBC  Recent Labs Lab 04/12/16 0436  WBC 6.3  HGB 12.1*  HCT 34.7*  PLT 214    Chemistries   Recent Labs Lab 04/06/16 1205  04/08/16 0312  04/12/16 0436  NA 128*  < > 131*  < > 130*  K 3.5  < > 3.5  < > 3.4*  CL 91*  < > 98*  < > 97*  CO2 26  < > 26  < > 27  GLUCOSE 264*  < > 131*  < > 107*  BUN 50*  < > 50*  < > 38*  CREATININE 1.72*  < > 1.75*  < > 1.33*  CALCIUM 8.7*  < > 8.4*  < > 8.0*  MG  --   < > 2.3  --   --   AST 53*  --   --   --   --   ALT 31  --   --   --   --   ALKPHOS 177*  --   --   --   --   BILITOT 5.9*  --   --   --   --   < > = values in this interval not displayed. Cardiac Enzymes  Recent Labs Lab 04/07/16 0455  TROPONINI 0.06*   RADIOLOGY:  Dg Chest Port 1 View  Result Date: 04/10/2016 CLINICAL DATA:  PICC line placement EXAM: PORTABLE CHEST 1 VIEW COMPARISON:  04/06/2016 FINDINGS: 1530 hours. Right PICC line tip overlies the distal SVC. The cardio pericardial silhouette is enlarged. Right base airspace disease persists. Left lung remains clear. Telemetry leads overlie the chest. IMPRESSION: Right PICC line tip overlies the distal SVC level. Electronically Signed   By: Kennith Center M.D.   On: 04/10/2016 15:50   ASSESSMENT AND PLAN:   1. Acute respiratory Failure - due to Acute on chronic systolic CHF. - pt. Was not responding dopamine drip and IV diuresis. -Seen by cardiology and started on Milrinone and IV Lasix yesterday and responding well. CVP down to 16 from 21.  Cont. Current management as per Cards.   2. Acute on chronic systolic CHF -Did not respond to IV diuretics and dopamine and now on IV milrinone, Lasix as per cardiology. - about 1200 cc (-) since started on meds yesterday.  - CVP reduced from 21-16.  Cont. Current  management.  ?? LVAD eval as outpatient.  - cont. Metolazone.    3. Pneumonia - resp. Symptoms due to CHF and not likely pneumonia.  - cont. Augmentin.  - wbc normal  4. Elevated troponin/CAD: -No symptoms of angina -Troponin mildly elevated and flat trending likely in the setting of supply demand ischemia   5. CKD stage III: - stable will diuresis and will monitor.   6. Constipation-resolved with Dulcolax supp and or enema  Case discussed with Care Management/Social Worker. Management plans discussed with the patient, family and they are in agreement.  CODE STATUS: full  DVT Prophylaxis: Heparin SQ  TOTAL  TIME TAKING CARE OF THIS PATIENT: 30 minutes.   POSSIBLE D/C IN 2-3 DAYS, DEPENDING ON CLINICAL CONDITION.  Note: This dictation was prepared with Dragon dictation along with smaller phrase technology. Any transcriptional errors that result from this process are unintentional.  Houston Siren M.D on 04/12/2016 at 3:14 PM  Between 7am to 6pm - Pager - [phone]  After 6pm go to [email or web address] - password [email or web address] [facility]  [place] [facility]  Office  [phone]  CC: Primary care physician; Toya Smothers, PA

## 2016-04-12 NOTE — Progress Notes (Signed)
[facility]. Report called to Ephriam Jenkinsemo Godinaz, RN. [facility] has been notified of same and stated that a transport unit would not be available until after 0000. Pt and family notified that it would be after midnight when transport services arrived. [facility]. Accepting physician is Clementeen Grahamobert Diep, MD.

## 2016-04-12 NOTE — Care Management (Signed)
I met again with patient and this time in the presence of his daughter and Loretta Plume. Patient would rather stay at [facility] for treatment which per MD is expected to complete in ~2 days however Mr. Housand/patient agrees with transfer to [facility] as this is where he receives medical treatment. Per Norina Buzzard with [facility] "patient is no longer in a medical emergency and if stable to transfer to [place] (if he wants them to fund this visit) he will need to transfer". I explained this to patient and Levada Dy and he wishes to be transferred to [facility] .Patient's sister Tony Granquist has called this RNCM and left message that "she does not want him to transfer and that she wants to have a family meeting". I returned to patient's room with patient and Levada Dy and explained that Lucita Ferrara had left message on my phone. Per patient and Shonteria Abeln/daughter- patient said "it is my decision- not hers Lucita Ferrara'). I explained that I would rather talk to him and Levada Dy instead of any other family member and patient/Ladora Osterberg agreed and asked that I not contact [place] back. I have faxed patient request to transfer to Norina Buzzard with [facility] for review.

## 2016-04-12 NOTE — Discharge Summary (Signed)
[employer] - [place] [facility]   PATIENT NAME: Kyle Morton    MR#:  [id number]  DATE OF BIRTH:  Aug 10, 1950  DATE OF ADMISSION:  04/06/2016 ADMITTING PHYSICIAN: Ramonita Lab, MD  DATE OF DISCHARGE: 04/12/2016  PRIMARY CARE PHYSICIAN: Toya Smothers, PA    ADMISSION DIAGNOSIS:  Shortness of breath [R06.02] Acute on chronic systolic congestive heart failure (HCC) [I50.23]  DISCHARGE DIAGNOSIS:  Principal Problem:   Acute respiratory distress (HCC) Active Problems:   Shortness of breath   Cardiomyopathy, ischemic   Acute on chronic combined systolic (congestive) and diastolic (congestive) heart failure (HCC)   Hyperlipidemia   DM2 (diabetes mellitus, type 2) (HCC)   CAD S/P percutaneous coronary angioplasty   Hypertensive heart and kidney disease   Malnutrition (HCC)   Hyponatremia   CKD (chronic kidney disease), stage III   Arterial hypotension   SECONDARY DIAGNOSIS:   Past Medical History:  Diagnosis Date  . Benign essential HTN   . Carotid artery stenosis    a. carotid dopplers 12/2014: less than 39% ICA bilaterally, f/u 1 year  . Chronic combined systolic and diastolic CHF (congestive heart failure) (HCC)    a. reported baseline EF 30-35%; b. echo 12/2014: EF 20-25%, diffuse HK, restrictive filling pattern, severely dilated LA, mod MR/TR/PR, PASP 70 mm Hg, IVC dilted c/w elevated CVP. c. EF 15% by cath in 01/2015.  Marland Kitchen CKD (chronic kidney disease), stage III   . Coronary artery disease    a. xience stent to D1 in 2007; 2.5 x 15 mm stent to OM3 in 2013, w/ 3 stents in 2015 (details not available);  b. 01/2015 Cath: LM nl, LAD 60p, 5m ISR, D1 95ost, 20 ISR, RI min irregs, LCX 20 ISR, OM1 small, OM2 30 ISR, OM3 40 ISR, RCA 100p, EF 15%, 2+ MR, elev R heart pres. c. 01/2015: s/p complex LAD diagonal bifurcation angioplasty/stenting with balloon angioplasty of the D1 & DES to prox LAD.  Marland Kitchen Depression   . DM2 (diabetes mellitus, type 2) (HCC)   . GERD  (gastroesophageal reflux disease)   . Gout    feet   . Hyperlipidemia   . Ischemic cardiomyopathy   . Pleural effusion   . PVC's (premature ventricular contractions)   . QT prolongation   . Vitamin D deficiency     HOSPITAL COURSE:   65 year old male with past medical history of severe cardiomyopathy ejection fraction of 15%, chronic kidney disease stage III, history of coronary artery disease, GERD, diabetes, peripheral vascular disease and presented to the hospital due to shortness of breath and noted to be in acute respiratory failure with hypoxia  1. Acute respiratory Failure with Hypoxia - due to Acute on chronic systolic CHF. - pt. Was not responding dopamine drip and IV diuresis. -Seen by cardiology and started on Milrinone and IV Lasix 04/11/16 and responding well. CVP down to 16 from 21.  Cont. Current management as per Cards.   2. Acute on chronic systolic CHF -Did not respond to IV diuretics and dopamine and now on IV milrinone, Lasix as per cardiology. - about 1200 cc (-) since started on meds yesterday.  - CVP reduced from 21-16.  Cont. Current management.  ?? LVAD eval as outpatient.   3. Pneumonia - resp. Symptoms due to CHF and not likely pneumonia.  - finished Augmentin treatment.  No further need for abx.   4. Elevated troponin/CAD: - No symptoms of angina -Troponin mildly elevated and trending flat likely in the setting  of supply demand ischemia   5. CKD stage III: - stable with diuresis and will monitor.   6. Constipation-resolved with Dulcolax supp and or enema  7. Gout - no acute attack and cont. Allopurinol.   Patient [facility] a [facility] They have accepted him as a transfer. He's being discharged there for further care of his CHF.  Patient likely needs his chronic medications adjusted prior to discharge.  DISCHARGE CONDITIONS:   Stable  CONSULTS OBTAINED:  Treatment Team:  Antonieta Ibaimothy J Gollan, MD  DRUG ALLERGIES:  No Known  Allergies  DISCHARGE MEDICATIONS:     Medication List    STOP taking these medications   ENTRESTO 24-26 MG Generic drug:  sacubitril-valsartan   isosorbide mononitrate 60 MG 24 hr tablet Commonly known as:  IMDUR   spironolactone 25 MG tablet Commonly known as:  ALDACTONE   torsemide 20 MG tablet Commonly known as:  DEMADEX   traZODone 100 MG tablet Commonly known as:  DESYREL     TAKE these medications   allopurinol 100 MG tablet Commonly known as:  ZYLOPRIM Take 200 mg by mouth daily.   aspirin EC 81 MG tablet Take 81 mg by mouth daily.   atorvastatin 80 MG tablet Commonly known as:  LIPITOR Take 80 mg by mouth daily.   carvedilol 12.5 MG tablet Commonly known as:  COREG Take 12.5 mg by mouth 2 (two) times daily with a meal.   cetirizine 10 MG tablet Commonly known as:  ZYRTEC Take 10 mg by mouth daily.   clopidogrel 75 MG tablet Commonly known as:  PLAVIX Take 1 tablet (75 mg total) by mouth daily.   colchicine 0.6 MG tablet Take 0.6 mg by mouth as needed.   dextrose 40 % Gel Commonly known as:  GLUTOSE Take 1 Tube by mouth once as needed for low blood sugar.   fluticasone 50 MCG/ACT nasal spray Commonly known as:  FLONASE Place 2 sprays into both nostrils daily as needed for allergies or rhinitis.   furosemide 10 MG/ML injection Commonly known as:  LASIX Inject 8 mLs (80 mg total) into the vein 2 (two) times daily.   insulin glargine 100 UNIT/ML injection Commonly known as:  LANTUS Inject 0.16 mLs (16 Units total) into the skin at bedtime. What changed:  how much to take   milrinone 20 MG/100 ML Soln infusion Commonly known as:  PRIMACOR Inject 20.15 mcg/min into the vein continuous.   nitroGLYCERIN 0.4 MG SL tablet Commonly known as:  NITROSTAT Place 1 tablet (0.4 mg total) under the tongue every 5 (five) minutes as needed for chest pain.   NOVOLOG 100 UNIT/ML injection Generic drug:  insulin aspart Inject 10 Units into the skin 3  (three) times daily before meals.   potassium chloride SA 20 MEQ tablet Commonly known as:  K-DUR,KLOR-CON Take 1 tablet (20 mEq total) by mouth 2 (two) times daily.   sodium fluoride 1.1 (0.5 F) MG/ML Soln Commonly known as:  LURIDE Take 1 drop by mouth daily as needed.   THERA-GESIC EX Apply 1 Dose topically 2 (two) times daily.   THROAT LOZENGES MT Use as directed 1 Dose in the mouth or throat every 4 (four) hours as needed (for sore throat).   venlafaxine XR 150 MG 24 hr capsule Commonly known as:  EFFEXOR-XR Take 150 mg by mouth daily with breakfast.   Vitamin D (Cholecalciferol) 1000 units Caps Take 2,000 Units by mouth daily.  DISCHARGE INSTRUCTIONS:   DIET:  Cardiac diet and Diabetic diet  DISCHARGE CONDITION:  Stable  ACTIVITY:  Activity as tolerated  OXYGEN:  Home Oxygen: No.   Oxygen Delivery: room air  DISCHARGE LOCATION:  [facility]   If you experience worsening of your admission symptoms, develop shortness of breath, life threatening emergency, suicidal or homicidal thoughts you must seek medical attention immediately by calling 911 or calling your MD immediately  if symptoms less severe.  You Must read complete instructions/literature along with all the possible adverse reactions/side effects for all the Medicines you take and that have been prescribed to you. Take any new Medicines after you have completely understood and accpet all the possible adverse reactions/side effects.   Please note  You were cared for by a hospitalist during your hospital stay. If you have any questions about your discharge medications or the care you received while you were in the hospital after you are discharged, you can call the unit and asked to speak with the hospitalist on call if the hospitalist that took care of you is not available. Once you are discharged, your primary care physician will handle any further medical issues. Please note that NO REFILLS for  any discharge medications will be authorized once you are discharged, as it is imperative that you return to your primary care physician (or establish a relationship with a primary care physician if you do not have one) for your aftercare needs so that they can reassess your need for medications and monitor your lab values.    DATA REVIEW:   CBC  Recent Labs Lab 04/12/16 0436  WBC 6.3  HGB 12.1*  HCT 34.7*  PLT 214    Chemistries   Recent Labs Lab 04/06/16 1205  04/08/16 0312  04/12/16 0436  NA 128*  < > 131*  < > 130*  K 3.5  < > 3.5  < > 3.4*  CL 91*  < > 98*  < > 97*  CO2 26  < > 26  < > 27  GLUCOSE 264*  < > 131*  < > 107*  BUN 50*  < > 50*  < > 38*  CREATININE 1.72*  < > 1.75*  < > 1.33*  CALCIUM 8.7*  < > 8.4*  < > 8.0*  MG  --   < > 2.3  --   --   AST 53*  --   --   --   --   ALT 31  --   --   --   --   ALKPHOS 177*  --   --   --   --   BILITOT 5.9*  --   --   --   --   < > = values in this interval not displayed.  Cardiac Enzymes  Recent Labs Lab 04/07/16 0455  TROPONINI 0.06*    Microbiology Results  Results for orders placed or performed during the hospital encounter of 04/06/16  MRSA PCR Screening     Status: None   Collection Time: 04/07/16  1:28 AM  Result Value Ref Range Status   MRSA by PCR NEGATIVE NEGATIVE Final    Comment:        The GeneXpert MRSA Assay (FDA approved for NASAL specimens only), is one component of a comprehensive MRSA colonization surveillance program. It is not intended to diagnose MRSA infection nor to guide or monitor treatment for MRSA infections.   Culture, blood (Routine X 2) w Reflex  to ID Panel     Status: None   Collection Time: 04/07/16  9:00 AM  Result Value Ref Range Status   Specimen Description BLOOD LEFT AC  Final   Special Requests   Final    BOTTLES DRAWN AEROBIC AND ANAEROBIC AER ANA   Culture NO GROWTH 5 DAYS  Final   Report Status 04/12/2016 FINAL  Final  Culture, blood (Routine X  2) w Reflex to ID Panel     Status: None   Collection Time: 04/07/16  9:05 AM  Result Value Ref Range Status   Specimen Description BLOOD RIGHT AC  Final   Special Requests   Final    BOTTLES DRAWN AEROBIC AND ANAEROBIC AER ANA   Culture NO GROWTH 5 DAYS  Final   Report Status 04/12/2016 FINAL  Final    RADIOLOGY:  No results found.    Management plans discussed with the patient, family and they are in agreement.  CODE STATUS:     Code Status Orders        Start     Ordered   04/06/16 1905  Full code  Continuous     04/06/16 1904    Code Status History    Date Active Date Inactive Code Status Order ID Comments User Context   04/06/2016  5:34 PM 04/06/2016  6:40 PM Full Code [id number]  Ramonita Lab, MD Inpatient   02/19/2015 10:22 AM 02/20/2015  9:32 PM Full Code [id number]  Iran Ouch, MD Inpatient   01/28/2015 10:41 PM 01/29/2015  6:42 PM Full Code [id number]  Milagros Loll, MD ED   01/07/2015 10:41 AM 01/10/2015  4:58 PM Full Code [id number]  Iran Ouch, MD Inpatient    Advance Directive Documentation   Flowsheet Row Most Recent Value  Type of Advance Directive  Living will  Pre-existing out of facility DNR order (yellow form or pink MOST form)  No data  "MOST" Form in Place?  No data      TOTAL TIME TAKING CARE OF THIS PATIENT: 45 minutes.    Houston Siren M.D on 04/12/2016 at 4:53 PM  Between 7am to 6pm - Pager - [phone]  After 6pm go to [email or web address] - Therapist, nutritional Hospitalists  Office  [phone]  CC: Primary care physician; Toya Smothers, PA

## 2016-05-05 IMAGING — DX DG KNEE 1-2V*R*
2 series · 2 of 2 positions shown · non-contrast
Comparison: None.

CLINICAL DATA: NO INJURY .ANTERIOR AND SLIGHTLY LATERAL RIGHT KNEE
PAIN FOR 3 DAYS

EXAM:
RIGHT KNEE - 1-2 VIEW

[knee ap]
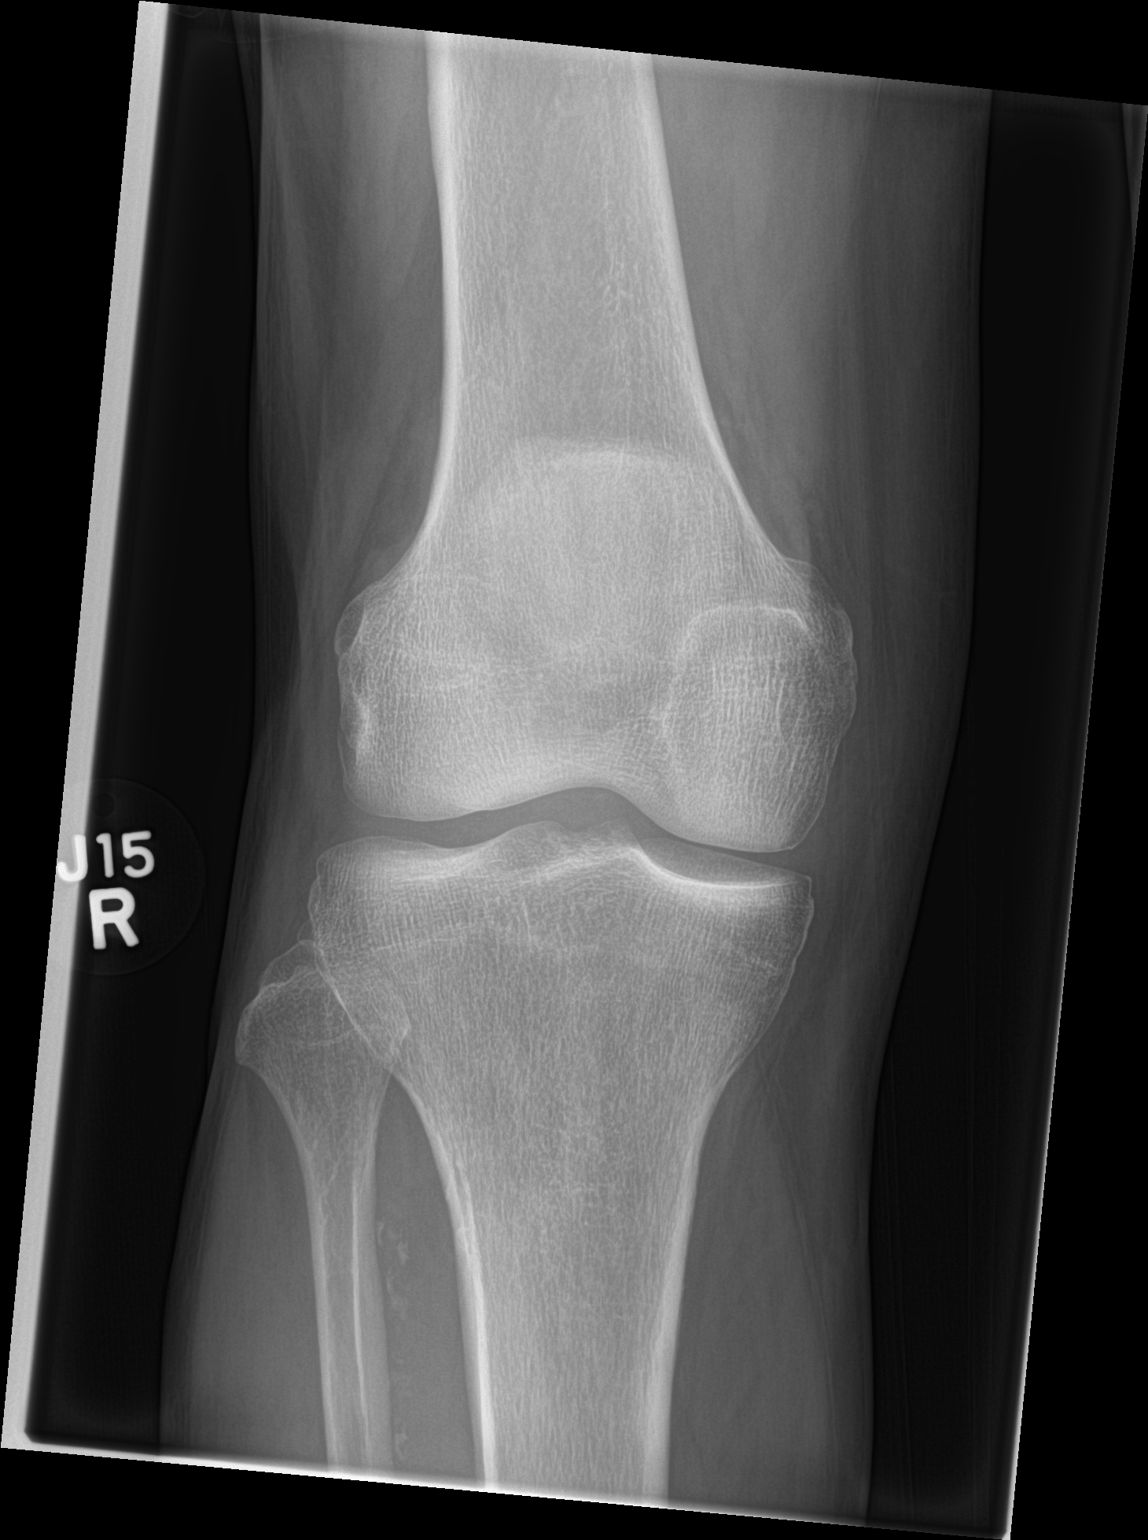

[knee lat]
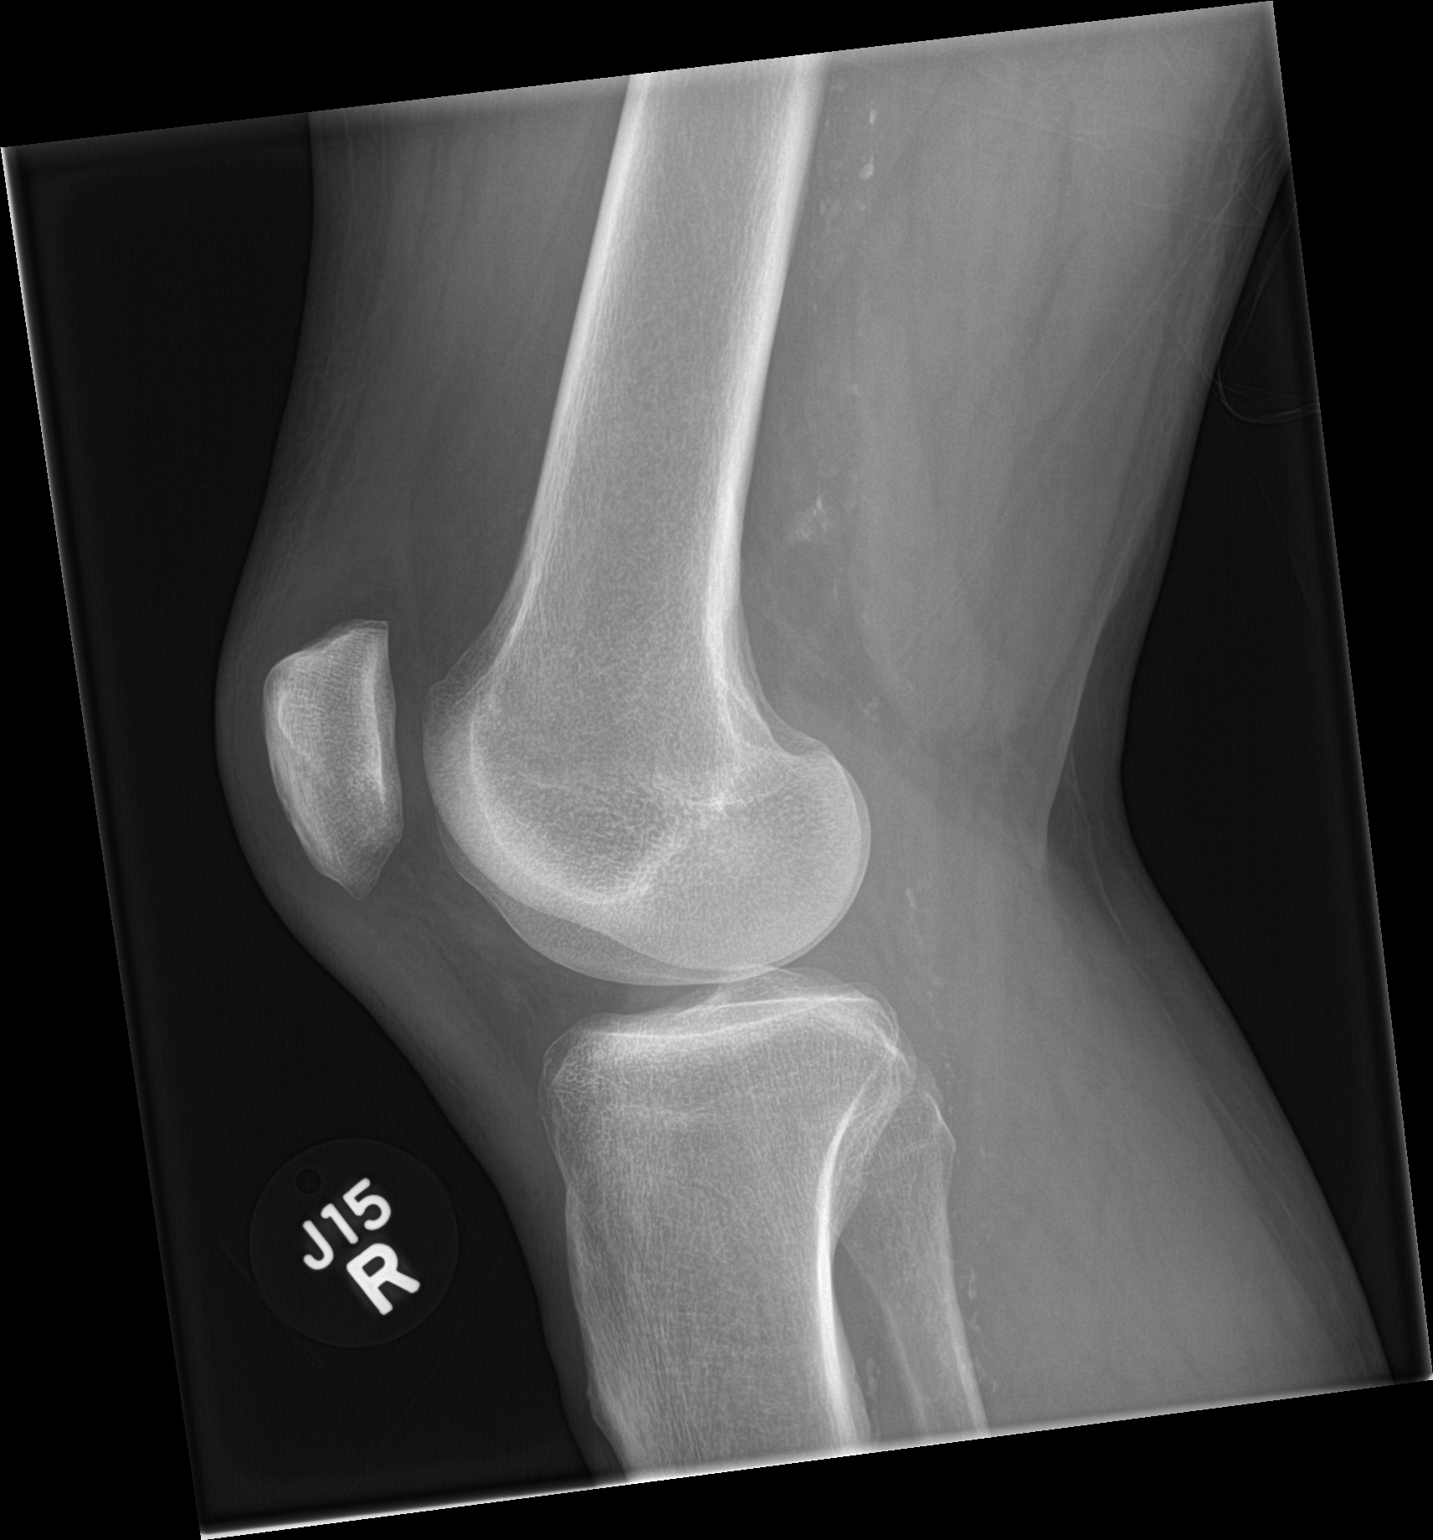

[2 of 2 positions shown; findings below may reference images not displayed]

FINDINGS: No fracture. No bone lesion. Knee joint normally spaced and aligned.
No arthropathic change. No convincing joint effusion.

Vascular calcifications are noted posteriorly.
IMPRESSION: 1. No fracture or bone lesion.
2. No arthropathic change.

## 2016-05-11 ENCOUNTER — Telehealth: Payer: Self-pay | Admitting: Cardiovascular Disease

## 2016-05-11 NOTE — Telephone Encounter (Signed)
3 attempts to schedule fu from recall list.   LMOV to call office . Deleting recall.  °

## 2016-10-09 IMAGING — US US EXTREM  UP VENOUS*L*
1 series · 13 of 24 positions shown · non-contrast
Comparison: None.

CLINICAL DATA: Left forearm swelling. Pain. Recent cardiac
catheterization utilizing upper extremity access.



[Series 1: us extrem up venous*left* · 0.07mm/px · 13 of 36 slices shown]
[im 1/36]
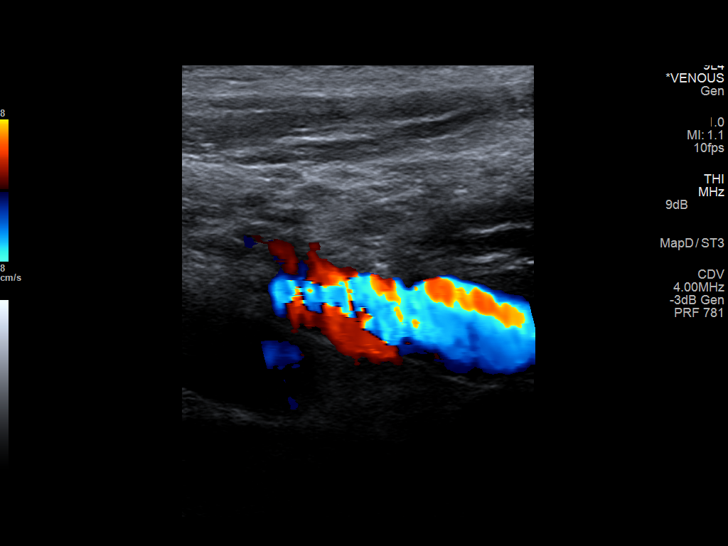
[im 4/36]
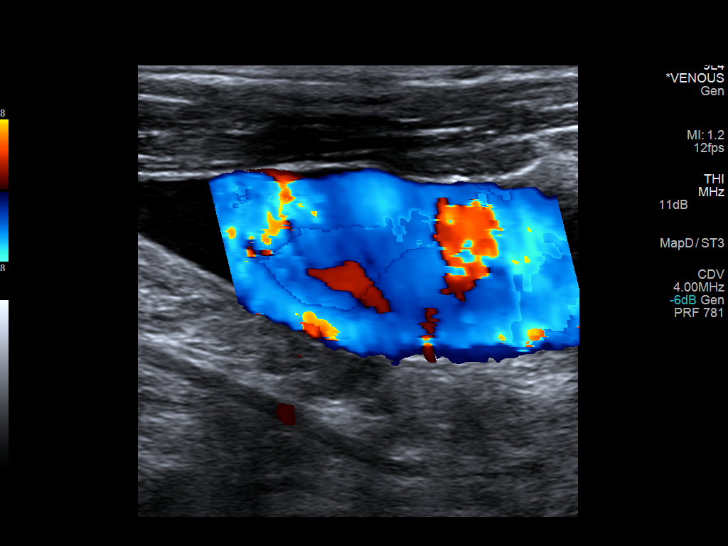
[im 7/36]
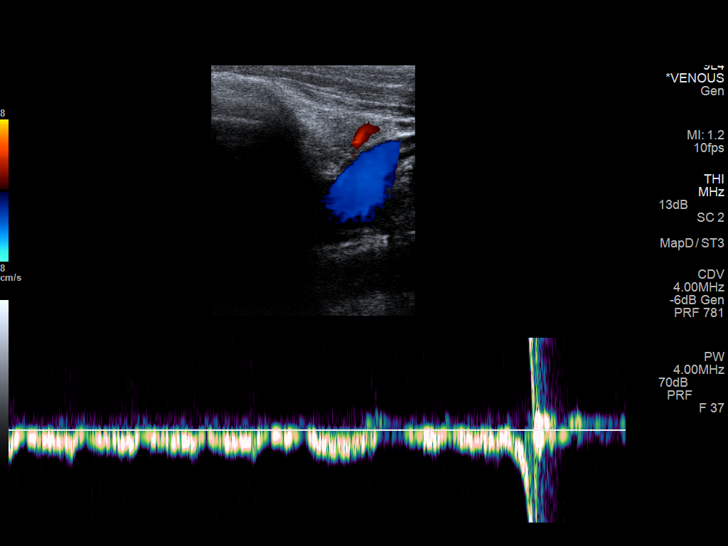
[im 10/36]
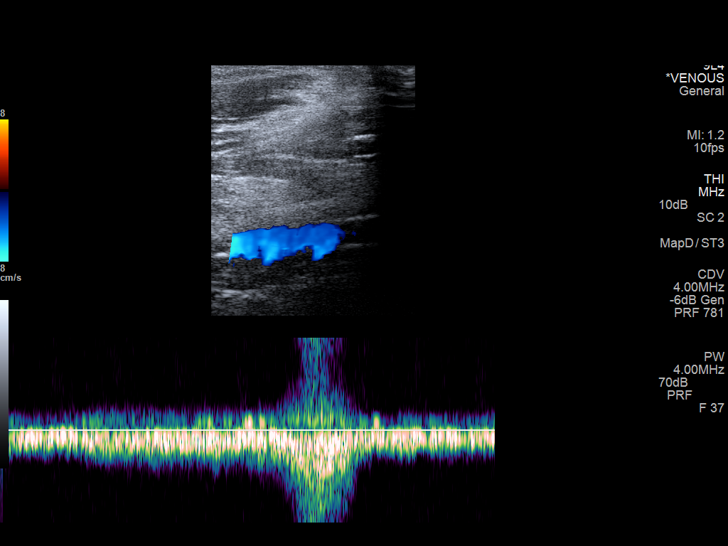
[im 13/36]
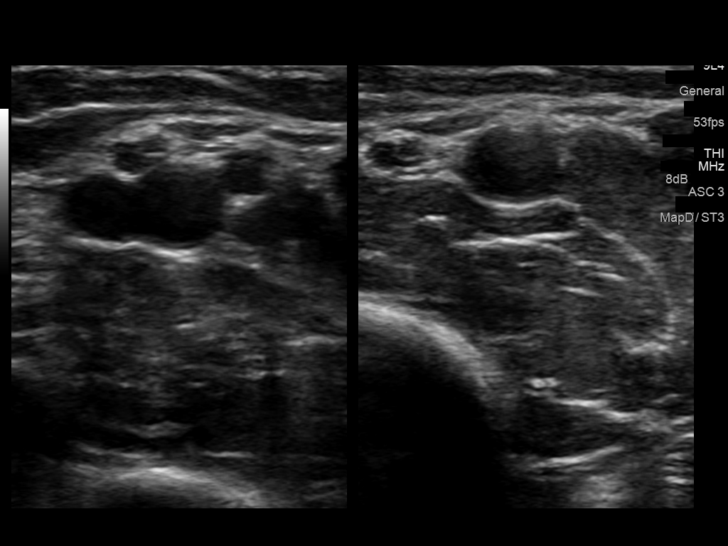
[im 16/36]
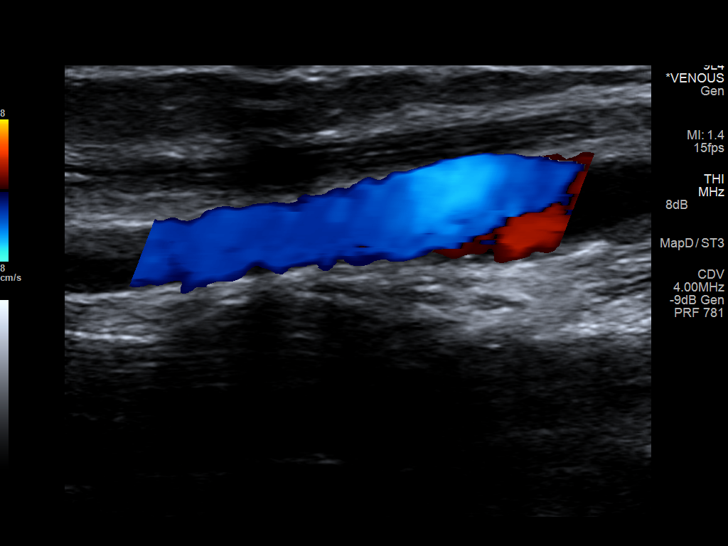
[im 19/36]
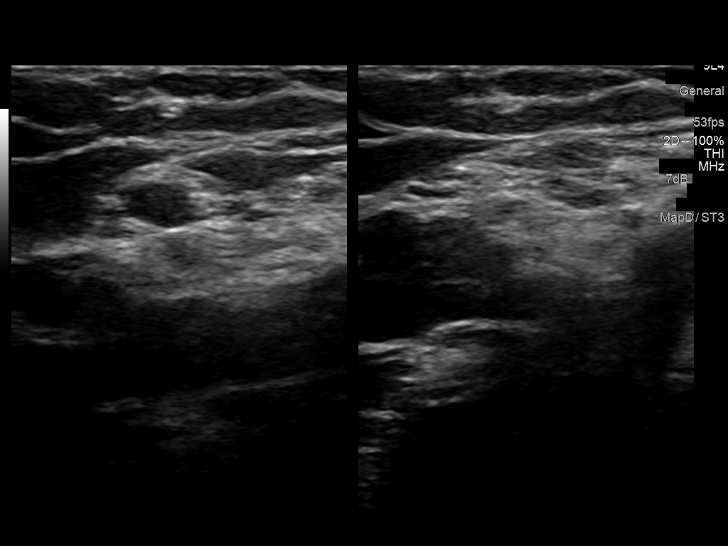
[im 20/36]
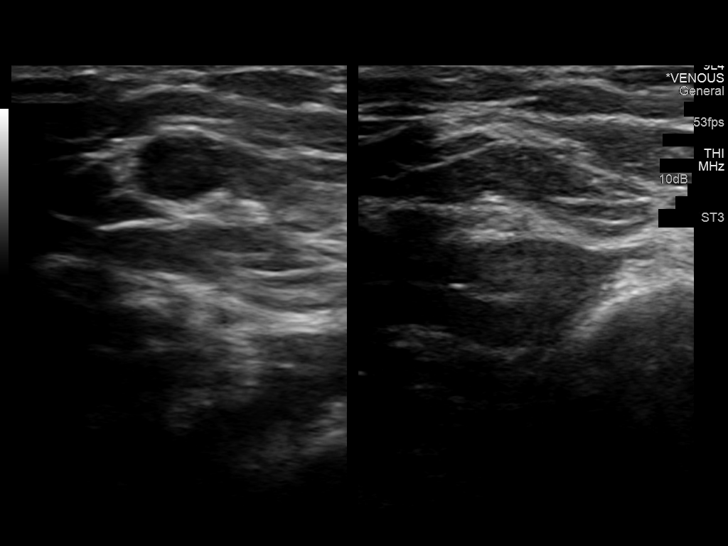
[im 23/36]
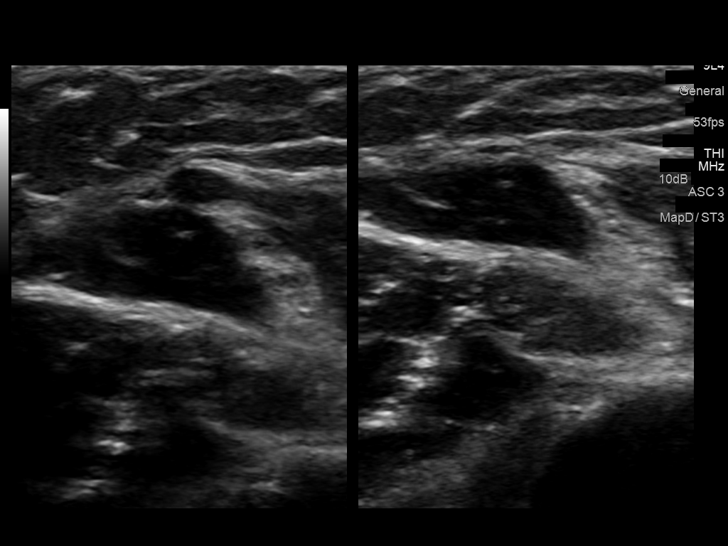
[im 26/36]
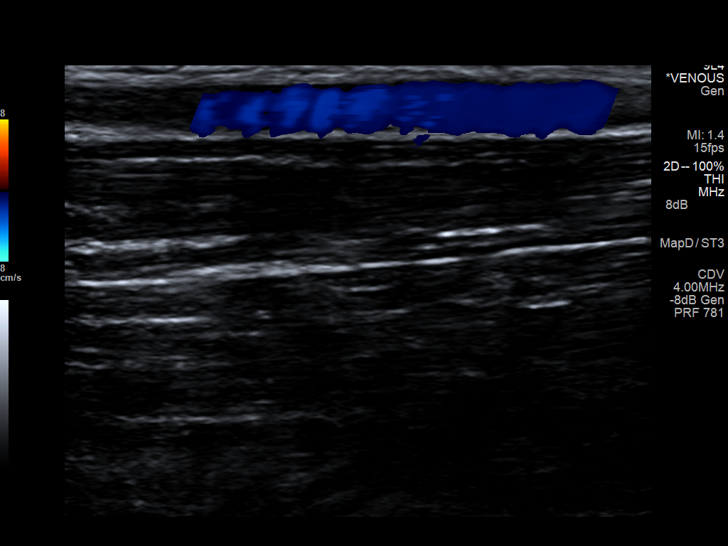
[im 29/36]
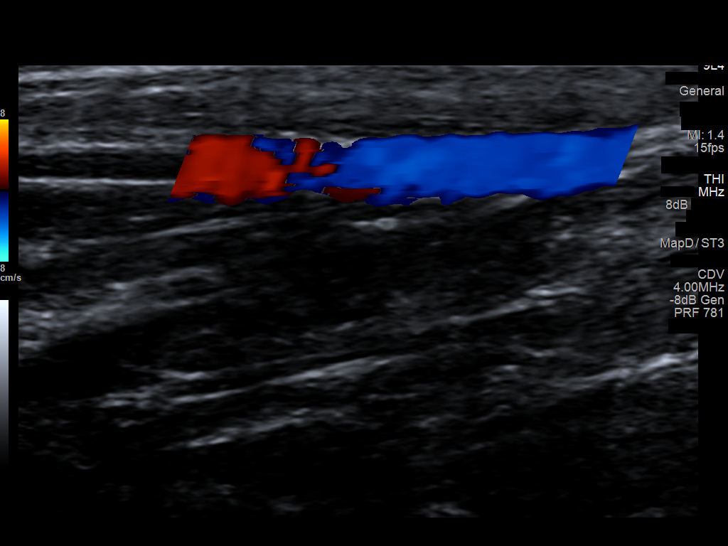
[im 32/36]
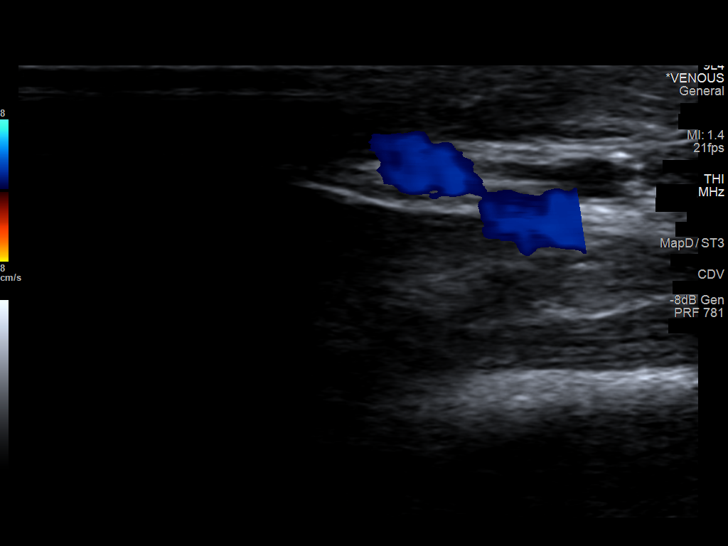
[im 36/36]
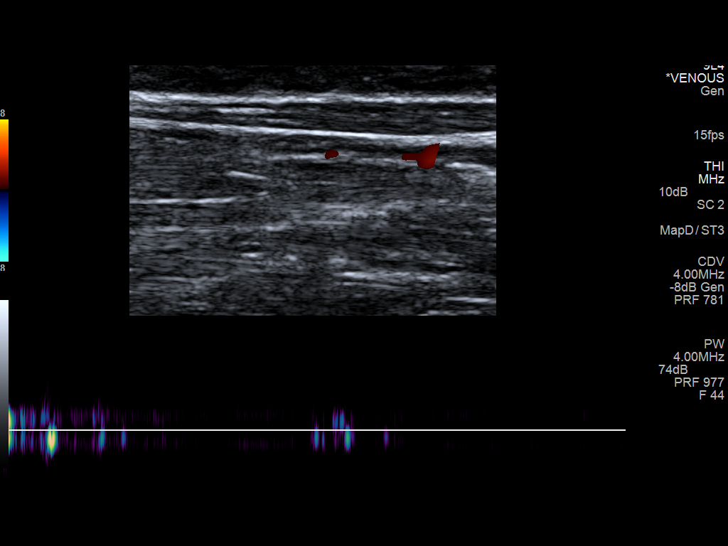

[13 of 24 positions shown; findings below may reference images not displayed]

FINDINGS: Contralateral Subclavian Vein: Respiratory phasicity is normal and
symmetric with the symptomatic side. No evidence of thrombus. Normal
compressibility.

Internal Jugular Vein: No evidence of thrombus. Normal
compressibility, respiratory phasicity and response to augmentation.

Subclavian Vein: No evidence of thrombus. Normal compressibility,
respiratory phasicity and response to augmentation.

Axillary Vein: No evidence of thrombus. Normal compressibility,
respiratory phasicity and response to augmentation.

Cephalic Vein: No evidence of thrombus. Normal compressibility,
respiratory phasicity and response to augmentation.

Basilic Vein: No evidence of thrombus. Normal compressibility,
respiratory phasicity and response to augmentation.

Brachial Veins: No evidence of thrombus. Normal compressibility,
respiratory phasicity and response to augmentation.

Radial Veins: No evidence of thrombus. Normal compressibility,
respiratory phasicity and response to augmentation.

Ulnar Veins: No evidence of thrombus. Normal compressibility,
respiratory phasicity and response to augmentation.

Venous Reflux:  None visualized.

Other Findings: In the area swelling in the lateral forearm there is
nonocclusive thrombus within a superficial vein, likely branch of
the cephalic.
IMPRESSION: 1. No evidence of deep venous thrombosis.
2. Nonocclusive superficial thrombus in area of swelling in the
forearm.

## 2020-09-02 DEATH — deceased
